# Patient Record
Sex: Male | Born: 1975 | Race: White | Hispanic: No | Marital: Married | Smoking: Current every day smoker
Health system: Southern US, Community
[De-identification: ages and names within clinical notes are randomized; demographics above are authoritative.]

## PROBLEM LIST (undated history)

## (undated) ENCOUNTER — Ambulatory Visit (HOSPITAL_COMMUNITY): Payer: 59

## (undated) VITALS — BP 107/78 | HR 92 | Temp 98.6°F

## (undated) DIAGNOSIS — M5136 Other intervertebral disc degeneration, lumbar region: Secondary | ICD-10-CM

## (undated) DIAGNOSIS — M51369 Other intervertebral disc degeneration, lumbar region without mention of lumbar back pain or lower extremity pain: Secondary | ICD-10-CM

## (undated) DIAGNOSIS — M542 Cervicalgia: Secondary | ICD-10-CM

## (undated) DIAGNOSIS — K5792 Diverticulitis of intestine, part unspecified, without perforation or abscess without bleeding: Secondary | ICD-10-CM

## (undated) DIAGNOSIS — G8929 Other chronic pain: Secondary | ICD-10-CM

## (undated) DIAGNOSIS — F329 Major depressive disorder, single episode, unspecified: Secondary | ICD-10-CM

## (undated) DIAGNOSIS — M1990 Unspecified osteoarthritis, unspecified site: Secondary | ICD-10-CM

## (undated) DIAGNOSIS — M545 Low back pain, unspecified: Secondary | ICD-10-CM

## (undated) DIAGNOSIS — R4589 Other symptoms and signs involving emotional state: Secondary | ICD-10-CM

## (undated) DIAGNOSIS — R569 Unspecified convulsions: Secondary | ICD-10-CM

## (undated) HISTORY — PX: TONSILLECTOMY: SUR1361

## (undated) HISTORY — DX: Unspecified convulsions: R56.9

## (undated) HISTORY — DX: Diverticulitis of intestine, part unspecified, without perforation or abscess without bleeding: K57.92

## (undated) HISTORY — DX: Cervicalgia: M54.2

---

## 1997-12-18 ENCOUNTER — Emergency Department (HOSPITAL_COMMUNITY): Admission: EM | Admit: 1997-12-18 | Discharge: 1997-12-18 | Payer: Self-pay | Admitting: Internal Medicine

## 1998-02-17 ENCOUNTER — Emergency Department (HOSPITAL_COMMUNITY): Admission: EM | Admit: 1998-02-17 | Discharge: 1998-02-17 | Payer: Self-pay | Admitting: Emergency Medicine

## 1999-09-05 ENCOUNTER — Encounter: Payer: Self-pay | Admitting: Emergency Medicine

## 1999-09-05 ENCOUNTER — Emergency Department (HOSPITAL_COMMUNITY): Admission: EM | Admit: 1999-09-05 | Discharge: 1999-09-05 | Payer: Self-pay | Admitting: Emergency Medicine

## 2005-12-28 ENCOUNTER — Emergency Department (HOSPITAL_COMMUNITY): Admission: EM | Admit: 2005-12-28 | Discharge: 2005-12-28 | Payer: Self-pay | Admitting: Emergency Medicine

## 2008-06-13 ENCOUNTER — Emergency Department (HOSPITAL_COMMUNITY): Admission: EM | Admit: 2008-06-13 | Discharge: 2008-06-14 | Payer: Self-pay | Admitting: Emergency Medicine

## 2010-09-22 ENCOUNTER — Emergency Department (HOSPITAL_COMMUNITY): Payer: Self-pay

## 2010-09-22 ENCOUNTER — Emergency Department (HOSPITAL_COMMUNITY)
Admission: EM | Admit: 2010-09-22 | Discharge: 2010-09-22 | Disposition: A | Discharge status: Home / Self Care | Payer: Self-pay | Attending: Emergency Medicine | Admitting: Emergency Medicine

## 2010-09-22 DIAGNOSIS — R1011 Right upper quadrant pain: Secondary | ICD-10-CM | POA: Insufficient documentation

## 2010-09-22 DIAGNOSIS — R110 Nausea: Secondary | ICD-10-CM | POA: Insufficient documentation

## 2010-09-22 DIAGNOSIS — R630 Anorexia: Secondary | ICD-10-CM | POA: Insufficient documentation

## 2010-09-22 LAB — DIFFERENTIAL
Basophils Relative: 0 % (ref 0–1)
Eosinophils Absolute: 0.2 10*3/uL (ref 0.0–0.7)
Eosinophils Relative: 1 % (ref 0–5)
Lymphs Abs: 2.8 10*3/uL (ref 0.7–4.0)
Monocytes Absolute: 1.3 10*3/uL — ABNORMAL HIGH (ref 0.1–1.0)
Monocytes Relative: 10 % (ref 3–12)
Neutrophils Relative %: 67 % (ref 43–77)

## 2010-09-22 LAB — HEPATIC FUNCTION PANEL
ALT: 31 U/L (ref 0–53)
AST: 22 U/L (ref 0–37)
Albumin: 3.6 g/dL (ref 3.5–5.2)
Alkaline Phosphatase: 95 U/L (ref 39–117)
Bilirubin, Direct: 0.1 mg/dL (ref 0.0–0.3)
Indirect Bilirubin: 0.3 mg/dL (ref 0.3–0.9)
Total Bilirubin: 0.4 mg/dL (ref 0.3–1.2)
Total Protein: 7.4 g/dL (ref 6.0–8.3)

## 2010-09-22 LAB — URINALYSIS, ROUTINE W REFLEX MICROSCOPIC
Ketones, ur: NEGATIVE mg/dL
Leukocytes, UA: NEGATIVE
Nitrite: NEGATIVE
Urobilinogen, UA: 0.2 mg/dL (ref 0.0–1.0)
pH: 6.5 (ref 5.0–8.0)

## 2010-09-22 LAB — CBC
MCH: 32.4 pg (ref 26.0–34.0)
MCHC: 35.2 g/dL (ref 30.0–36.0)
MCV: 92 fL (ref 78.0–100.0)
Platelets: 350 10*3/uL (ref 150–400)
RBC: 5.28 MIL/uL (ref 4.22–5.81)
RDW: 13.4 % (ref 11.5–15.5)

## 2010-09-22 LAB — BASIC METABOLIC PANEL
BUN: 9 mg/dL (ref 6–23)
CO2: 26 mEq/L (ref 19–32)
Calcium: 9.9 mg/dL (ref 8.4–10.5)
Chloride: 98 mEq/L (ref 96–112)
Creatinine, Ser: 0.94 mg/dL (ref 0.4–1.5)
GFR calc Af Amer: >60 mL/min (ref >60)
GFR calc non Af Amer: >60 mL/min (ref >60)
Glucose, Bld: 123 mg/dL — ABNORMAL HIGH (ref 70–99)
Potassium: 3.2 mEq/L — ABNORMAL LOW (ref 3.5–5.1)
Sodium: 133 mEq/L — ABNORMAL LOW (ref 135–145)

## 2010-09-22 MED ORDER — IOHEXOL 300 MG/ML  SOLN
100.0000 mL | Freq: Once | INTRAMUSCULAR | Status: AC | PRN
  Administered 2010-09-22: 100 mL via INTRAVENOUS

## 2014-07-04 ENCOUNTER — Encounter (HOSPITAL_COMMUNITY): Payer: Self-pay | Admitting: Emergency Medicine

## 2014-07-04 ENCOUNTER — Emergency Department (HOSPITAL_COMMUNITY)
Admission: EM | Admit: 2014-07-04 | Discharge: 2014-07-04 | Disposition: A | Discharge status: Home / Self Care | Payer: BLUE CROSS/BLUE SHIELD | Attending: Emergency Medicine | Admitting: Emergency Medicine

## 2014-07-04 DIAGNOSIS — Y939 Activity, unspecified: Secondary | ICD-10-CM | POA: Diagnosis not present

## 2014-07-04 DIAGNOSIS — Y999 Unspecified external cause status: Secondary | ICD-10-CM | POA: Diagnosis not present

## 2014-07-04 DIAGNOSIS — X58XXXA Exposure to other specified factors, initial encounter: Secondary | ICD-10-CM | POA: Diagnosis not present

## 2014-07-04 DIAGNOSIS — S39011A Strain of muscle, fascia and tendon of abdomen, initial encounter: Secondary | ICD-10-CM | POA: Diagnosis not present

## 2014-07-04 DIAGNOSIS — Z79899 Other long term (current) drug therapy: Secondary | ICD-10-CM | POA: Insufficient documentation

## 2014-07-04 DIAGNOSIS — S39012A Strain of muscle, fascia and tendon of lower back, initial encounter: Secondary | ICD-10-CM | POA: Diagnosis not present

## 2014-07-04 DIAGNOSIS — Z720 Tobacco use: Secondary | ICD-10-CM | POA: Insufficient documentation

## 2014-07-04 DIAGNOSIS — G8929 Other chronic pain: Secondary | ICD-10-CM | POA: Diagnosis not present

## 2014-07-04 DIAGNOSIS — Y929 Unspecified place or not applicable: Secondary | ICD-10-CM | POA: Insufficient documentation

## 2014-07-04 DIAGNOSIS — S3992XA Unspecified injury of lower back, initial encounter: Secondary | ICD-10-CM | POA: Diagnosis present

## 2014-07-04 MED ORDER — METHOCARBAMOL 500 MG PO TABS: 500.0000 mg | ORAL_TABLET | Freq: Three times a day (TID) | ORAL | Status: DC | PRN

## 2014-07-04 MED ORDER — CYCLOBENZAPRINE HCL 10 MG PO TABS
10.0000 mg | ORAL_TABLET | Freq: Once | ORAL | Status: AC
  Administered 2014-07-04: 10 mg via ORAL
  Filled 2014-07-04: qty 1

## 2014-07-04 MED ORDER — OXYCODONE-ACETAMINOPHEN 5-325 MG PO TABS: 2.0000 | ORAL_TABLET | ORAL | Status: DC | PRN

## 2014-07-04 MED ORDER — OXYCODONE-ACETAMINOPHEN 5-325 MG PO TABS
2.0000 | ORAL_TABLET | Freq: Once | ORAL | Status: AC
  Administered 2014-07-04: 2 via ORAL
  Filled 2014-07-04: qty 2

## 2014-07-04 MED ORDER — NAPROXEN 500 MG PO TABS: 500.0000 mg | ORAL_TABLET | Freq: Two times a day (BID) | ORAL | Status: DC

## 2014-07-04 MED ORDER — IBUPROFEN 800 MG PO TABS
800.0000 mg | ORAL_TABLET | Freq: Once | ORAL | Status: AC
  Administered 2014-07-04: 800 mg via ORAL
  Filled 2014-07-04: qty 1

## 2014-07-04 NOTE — ED Notes (Signed)
Pt was moving a stove, felt a pop in left side of abdomen and lower back pain radiating down left left leg

## 2014-07-04 NOTE — ED Notes (Signed)
Pt alert & oriented x4, stable gait. Patient given discharge instructions, paperwork & prescription(s). Patient  instructed to stop at the registration desk to finish any additional paperwork. Patient verbalized understanding. Pt left department w/ no further questions. 

## 2014-07-04 NOTE — Discharge Instructions (Signed)
Back Pain, Adult °Back pain is very common. The pain often gets better over time. The cause of back pain is usually not dangerous. Most people can learn to manage their back pain on their own.  °HOME CARE  °· Stay active. Start with short walks on flat ground if you can. Try to walk farther each day. °· Do not sit, drive, or stand in one place for more than 30 minutes. Do not stay in bed. °· Do not avoid exercise or work. Activity can help your back heal faster. °· Be careful when you bend or lift an object. Bend at your knees, keep the object close to you, and do not twist. °· Sleep on a firm mattress. Lie on your side, and bend your knees. If you lie on your back, put a pillow under your knees. °· Only take medicines as told by your doctor. °· Put ice on the injured area. °¨ Put ice in a plastic bag. °¨ Place a towel between your skin and the bag. °¨ Leave the ice on for 15-20 minutes, 03-04 times a day for the first 2 to 3 days. After that, you can switch between ice and heat packs. °· Ask your doctor about back exercises or massage. °· Avoid feeling anxious or stressed. Find good ways to deal with stress, such as exercise. °GET HELP RIGHT AWAY IF:  °· Your pain does not go away with rest or medicine. °· Your pain does not go away in 1 week. °· You have new problems. °· You do not feel well. °· The pain spreads into your legs. °· You cannot control when you poop (bowel movement) or pee (urinate). °· Your arms or legs feel weak or lose feeling (numbness). °· You feel sick to your stomach (nauseous) or throw up (vomit). °· You have belly (abdominal) pain. °· You feel like you may pass out (faint). °MAKE SURE YOU:  °· Understand these instructions. °· Will watch your condition. °· Will get help right away if you are not doing well or get worse. °Document Released: 10/07/2007 Document Revised: 07/13/2011 Document Reviewed: 08/22/2013 °[employer]® Patient Information ©2015 [employer]. This information is not intended  to replace advice given to you by your health care provider. Make sure you discuss any questions you have with your health care provider. °Muscle Strain °A muscle strain is an injury that occurs when a muscle is stretched beyond its normal length. Usually a small number of muscle fibers are torn when this happens. Muscle strain is rated in degrees. First-degree strains have the least amount of muscle fiber tearing and pain. Second-degree and third-degree strains have increasingly more tearing and pain.  °Usually, recovery from muscle strain takes 1-2 weeks. Complete healing takes 5-6 weeks.  °CAUSES  °Muscle strain happens when a sudden, violent force placed on a muscle stretches it too far. This may occur with lifting, sports, or a fall.  °RISK FACTORS °Muscle strain is especially common in athletes.  °SIGNS AND SYMPTOMS °At the site of the muscle strain, there may be: °· Pain. °· Bruising. °· Swelling. °· Difficulty using the muscle due to pain or lack of normal function. °DIAGNOSIS  °Your health care provider will perform a physical exam and ask about your medical history. °TREATMENT  °Often, the best treatment for a muscle strain is resting, icing, and applying cold compresses to the injured area.   °HOME CARE INSTRUCTIONS  °· Use the PRICE method of treatment to promote muscle healing during the first 2-3   days after your injury. The PRICE method involves: °¨ Protecting the muscle from being injured again. °¨ Restricting your activity and resting the injured body part. °¨ Icing your injury. To do this, put ice in a plastic bag. Place a towel between your skin and the bag. Then, apply the ice and leave it on from 15-20 minutes each hour. After the third day, switch to moist heat packs. °¨ Apply compression to the injured area with a splint or elastic bandage. Be careful not to wrap it too tightly. This may interfere with blood circulation or increase swelling. °¨ Elevate the injured body part above the level of  your heart as often as you can. °· Only take over-the-counter or prescription medicines for pain, discomfort, or fever as directed by your health care provider. °· Warming up prior to exercise helps to prevent future muscle strains. °SEEK MEDICAL CARE IF:  °· You have increasing pain or swelling in the injured area. °· You have numbness, tingling, or a significant loss of strength in the injured area. °MAKE SURE YOU:  °· Understand these instructions. °· Will watch your condition. °· Will get help right away if you are not doing well or get worse. °Document Released: 04/20/2005 Document Revised: 02/08/2013 Document Reviewed: 11/17/2012 °Exit[employer]® Patient Information ©2015 [employer]. This information is not intended to replace advice given to you by your health care provider. Make sure you discuss any questions you have with your health care provider. ° °

## 2014-07-04 NOTE — ED Provider Notes (Signed)
CSN: [id number]     Arrival date & time 07/04/14  2141 History   First MD Initiated Contact with Patient 07/04/14 2153     Chief Complaint  Patient presents with  . Back Pain      HPI  She presents for evaluation of pain in his anterior abdomen, and low back. Posterior limits 20 to move a washer tonight. States that he tried to [employer] it hard" to turn it. When he did it fell and had to suddenly catch it. He felt sudden pain in his left anterior abdomen, and left lower back. Has a history of mild chronic back pain. Has never been evaluated by physician or had imaging in the past. Tonight he denies pain down the leg. No bowel or bladder changes.  History reviewed. No pertinent past medical history. Past Surgical History  Procedure Laterality Date  . Tonsillectomy     History reviewed. No pertinent family history. History  Substance Use Topics  . Smoking status: Current Every Day Smoker -- 1.00 packs/day    Types: Cigarettes  . Smokeless tobacco: Not on file  . Alcohol Use: No    Review of Systems  Constitutional: Negative for fever, chills, diaphoresis, appetite change and fatigue.  HENT: Negative for mouth sores, sore throat and trouble swallowing.   Eyes: Negative for visual disturbance.  Respiratory: Negative for cough, chest tightness, shortness of breath and wheezing.   Cardiovascular: Negative for chest pain.  Gastrointestinal: Positive for abdominal pain. Negative for nausea, vomiting, diarrhea and abdominal distention.  Endocrine: Negative for polydipsia, polyphagia and polyuria.  Genitourinary: Negative for dysuria, frequency and hematuria.  Musculoskeletal: Positive for back pain. Negative for gait problem.  Skin: Negative for color change, pallor and rash.  Neurological: Negative for dizziness, syncope, light-headedness and headaches.  Hematological: Does not bruise/bleed easily.  Psychiatric/Behavioral: Negative for behavioral problems and confusion.       Allergies  Review of patient's allergies indicates no known allergies.  Home Medications   Prior to Admission medications   Medication Sig Start Date End Date Taking? Authorizing Provider  Glucosamine-Chondroitin-MSM (TRIPLE FLEX PO) Take 1 packet by mouth daily.   Yes Historical Provider, MD   BP 128/69 mmHg  Pulse 88  Temp(Src) 98 F (36.7 C) (Oral)  Resp 20  Ht  (1.778 m)  Wt 230 lb (104.327 kg)  BMI 33.00 kg/m2  SpO2 99% Physical Exam  Constitutional: He is oriented to person, place, and time. He appears well-developed and well-nourished. No distress.  HENT:  Head: Normocephalic.  Eyes: Conjunctivae are normal. Pupils are equal, round, and reactive to light. No scleral icterus.  Neck: Normal range of motion. Neck supple. No thyromegaly present.  Cardiovascular: Normal rate and regular rhythm.  Exam reveals no gallop and no friction rub.   No murmur heard. Pulmonary/Chest: Effort normal and breath sounds normal. No respiratory distress. He has no wheezes. He has no rales.  Abdominal: Soft. Bowel sounds are normal. He exhibits no distension. There is no tenderness. There is no rebound.    Musculoskeletal: Normal range of motion.       Back:  Neurological: He is alert and oriented to person, place, and time.   Normal symmetric strength to flex/.extend hip and knees, dorsi/plantar flex ankles. Normal symmetric sensation to all distributions to LEs Patellar and achilles reflexes 1-2+. Downgoing Babinski   Skin: Skin is warm and dry. No rash noted.  Psychiatric: He has a normal mood and affect. His behavior is normal.  ED Course  Procedures (including critical care time) Labs Review Labs Reviewed - No data to display  Imaging Review No results found.   EKG Interpretation None      MDM   Final diagnoses:  None    Tenderness along the abdominal wall in an oblique fashion. No palpable defect or hernia. No inguinal or scrotal pain or hernia  palpable.  Tinnitus and low lumbar spine. He reports that his medial thighs frequently numb. However he has no radicular pain. He has no weakness or areflexia on exam. No bowel or bladder symptoms. I think is appropriate for outpatient treatment. Conservative treatment with decreased activity, avoiding heavy lifting. Supine for the next 48-72 hours. Anti-inflammatories, pain medicines, muscle relaxants. Primary care follow-up.    Rolland PorterMark Haylei Cobin, MD 07/04/14 2218

## 2014-07-08 ENCOUNTER — Emergency Department (HOSPITAL_COMMUNITY)
Admission: EM | Admit: 2014-07-08 | Discharge: 2014-07-08 | Disposition: A | Discharge status: Other Acute Inpatient Hospital | Payer: BLUE CROSS/BLUE SHIELD | Attending: Emergency Medicine | Admitting: Emergency Medicine

## 2014-07-08 ENCOUNTER — Emergency Department (HOSPITAL_COMMUNITY): Payer: BLUE CROSS/BLUE SHIELD

## 2014-07-08 ENCOUNTER — Encounter (HOSPITAL_COMMUNITY): Payer: Self-pay | Admitting: Emergency Medicine

## 2014-07-08 DIAGNOSIS — M5442 Lumbago with sciatica, left side: Secondary | ICD-10-CM

## 2014-07-08 DIAGNOSIS — R200 Anesthesia of skin: Secondary | ICD-10-CM | POA: Insufficient documentation

## 2014-07-08 DIAGNOSIS — M5441 Lumbago with sciatica, right side: Secondary | ICD-10-CM

## 2014-07-08 DIAGNOSIS — M549 Dorsalgia, unspecified: Secondary | ICD-10-CM

## 2014-07-08 DIAGNOSIS — M4316 Spondylolisthesis, lumbar region: Secondary | ICD-10-CM | POA: Diagnosis not present

## 2014-07-08 DIAGNOSIS — M545 Low back pain: Secondary | ICD-10-CM | POA: Diagnosis present

## 2014-07-08 DIAGNOSIS — M5416 Radiculopathy, lumbar region: Secondary | ICD-10-CM

## 2014-07-08 DIAGNOSIS — M5417 Radiculopathy, lumbosacral region: Secondary | ICD-10-CM

## 2014-07-08 HISTORY — DX: Other intervertebral disc degeneration, lumbar region without mention of lumbar back pain or lower extremity pain: M51.369

## 2014-07-08 HISTORY — DX: Other intervertebral disc degeneration, lumbar region: M51.36

## 2014-07-08 MED ORDER — METHYLPREDNISOLONE SODIUM SUCC 125 MG IJ SOLR
125.0000 mg | Freq: Once | INTRAMUSCULAR | Status: AC
Start: 2014-07-08 — End: 2014-07-08
  Administered 2014-07-08: 125 mg via INTRAVENOUS
  Filled 2014-07-08: qty 2

## 2014-07-08 MED ORDER — DIAZEPAM 5 MG PO TABS: 5.0000 mg | ORAL_TABLET | Freq: Three times a day (TID) | ORAL | Status: DC | PRN

## 2014-07-08 MED ORDER — HYDROMORPHONE HCL 1 MG/ML IJ SOLN
1.0000 mg | Freq: Once | INTRAMUSCULAR | Status: AC
Start: 2014-07-08 — End: 2014-07-08
  Administered 2014-07-08: 1 mg via INTRAMUSCULAR
  Filled 2014-07-08: qty 1

## 2014-07-08 MED ORDER — HYDROMORPHONE HCL 1 MG/ML IJ SOLN
1.0000 mg | Freq: Once | INTRAMUSCULAR | Status: AC
  Administered 2014-07-08: 1 mg via INTRAVENOUS
  Filled 2014-07-08: qty 1

## 2014-07-08 MED ORDER — OXYCODONE-ACETAMINOPHEN 5-325 MG PO TABS: 2.0000 | ORAL_TABLET | ORAL | Status: DC | PRN

## 2014-07-08 MED ORDER — ONDANSETRON HCL 4 MG/2ML IJ SOLN
4.0000 mg | Freq: Once | INTRAMUSCULAR | Status: DC
  Filled 2014-07-08: qty 2

## 2014-07-08 MED ORDER — DIAZEPAM 5 MG/ML IJ SOLN
5.0000 mg | Freq: Once | INTRAMUSCULAR | Status: AC
  Administered 2014-07-08: 5 mg via INTRAVENOUS
  Filled 2014-07-08: qty 2

## 2014-07-08 MED ORDER — PREDNISONE 20 MG PO TABS
ORAL_TABLET | ORAL | Status: DC
Start: 2014-07-08 — End: 2014-08-30

## 2014-07-08 NOTE — ED Provider Notes (Addendum)
Medical screening exam:  Patient referred to the ER from [facility] emergency department where he was evaluated for low back pain. Patient had acute onset of lower back pain while moving appliance. Since then he has had progressive worsening of the pain. Initially had some numbness in his right thigh, now both of his legs are numb and pain is radiating down both legs. He feels like his legs are weak.  Face to face Exam: HEENT - PERRLA Lungs - CTAB Heart - RRR, no M/R/G Abd - S/NT/ND Neuro - alert, oriented x3, bilateral patella reflexes are normal; sensation is normal bilaterally; patient has 4 out of 5 strength bilaterally in lower extremities, subjectively feels weak  Results for orders placed or performed during the hospital encounter of 09/22/10  Hepatic function panel  Result Value Ref Range   Total Protein 7.4 6.0 - 8.3 g/dL   Albumin 3.6 3.5 - 5.2 g/dL   AST 22 0 - 37 U/L   ALT 31 0 - 53 U/L   Alkaline Phosphatase 95 39 - 117 U/L   Total Bilirubin 0.4 0.3 - 1.2 mg/dL   Bilirubin, Direct 0.1 0.0 - 0.3 mg/dL   Indirect Bilirubin 0.3 0.3 - 0.9 mg/dL  Basic metabolic panel  Result Value Ref Range   Sodium 133 (L) 135 - 145 mEq/L   Potassium 3.2 (L) 3.5 - 5.1 mEq/L   Chloride 98 96 - 112 mEq/L   CO2 26 19 - 32 mEq/L   Glucose, Bld 123 (H) 70 - 99 mg/dL   BUN 9 6 - 23 mg/dL   Creatinine, Ser 0.94 0.4 - 1.5 mg/dL   Calcium 9.9 8.4 - 10.5 mg/dL   GFR calc non Af Amer >60 >60 mL/min   GFR calc Af Amer  >60 mL/min    >60        The eGFR has been calculated using the MDRD equation. This calculation has not been validated in all clinical situations. eGFR's persistently <60 mL/min signify possible Chronic Kidney Disease.  Lipase, blood  Result Value Ref Range   Lipase 20 11 - 59 U/L  Differential  Result Value Ref Range   Neutrophils Relative % 67 43 - 77 %   Neutro Abs 8.8 (H) 1.7 - 7.7 K/uL   Lymphocytes Relative 22 12 - 46 %   Lymphs Abs 2.8 0.7 - 4.0 K/uL   Monocytes  Relative 10 3 - 12 %   Monocytes Absolute 1.3 (H) 0.1 - 1.0 K/uL   Eosinophils Relative 1 0 - 5 %   Eosinophils Absolute 0.2 0.0 - 0.7 K/uL   Basophils Relative 0 0 - 1 %   Basophils Absolute 0.0 0.0 - 0.1 K/uL  CBC  Result Value Ref Range   WBC 13.1 (H) 4.0 - 10.5 K/uL   RBC 5.28 4.22 - 5.81 MIL/uL   Hemoglobin 17.1 (H) 13.0 - 17.0 g/dL   HCT 48.6 39.0 - 52.0 %   MCV 92.0 78.0 - 100.0 fL   MCH 32.4 26.0 - 34.0 pg   MCHC 35.2 30.0 - 36.0 g/dL   RDW 13.4 11.5 - 15.5 %   Platelets 350 150 - 400 K/uL  Urinalysis, Routine w reflex microscopic  Result Value Ref Range   Color, Urine YELLOW YELLOW   APPearance CLEAR CLEAR   Specific Gravity, Urine 1.030 1.005 - 1.030   pH 6.5 5.0 - 8.0   Glucose, UA NEGATIVE NEGATIVE mg/dL   Hgb urine dipstick NEGATIVE NEGATIVE   Bilirubin Urine  SMALL (A) NEGATIVE   Ketones, ur NEGATIVE NEGATIVE mg/dL   Protein, ur TRACE (A) NEGATIVE mg/dL   Urobilinogen, UA 0.2 0.0 - 1.0 mg/dL   Nitrite NEGATIVE NEGATIVE   Leukocytes, UA NEGATIVE NEGATIVE  Urine microscopic-add on  Result Value Ref Range   WBC, UA 0-2 0-2 <3 WBC/hpf   Mr Lumbar Spine Wo Contrast  07/08/2014   CLINICAL DATA:  Acute onset lower back pain while moving appliances on July 04, 2014. Progressive worsening pain, RIGHT greater than LEFT leg numbness and pain, leg weakness. History of chronic back pain and lumbar degenerative disc disease.  EXAM: MRI LUMBAR SPINE WITHOUT CONTRAST  TECHNIQUE: Multiplanar, multisequence MR imaging of the lumbar spine was performed. No intravenous contrast was administered.  COMPARISON:  CT of the abdomen and pelvis July 08, 2014 at 1853 hours  FINDINGS: Using the reference level of the last well-formed intervertebral disc as L5-S1, grade 2 (8 mm) L5-S1 anterolisthesis with chronic bilateral L5 pars interarticularis defects. Moderate to severe L5-S1 disc height loss, decreased T2 signal within the disc consistent with desiccation, with linear low signal consistent  with vacuum phenomena. L4-5 disc desiccation with maintained morphology. Mild acute discogenic endplate changes at D7-O2 without STIR signal abnormality to suggest fracture. Scattered chronic Schmorl's nodes.  Conus medullaris terminates at T12-L1 and appears normal in morphology and signal characteristics. Cauda equina is unremarkable. Included prevertebral and paraspinal soft tissues are nonsuspicious.  Level by level evaluation:  T12-L1, L1-2, L2-3, L3-4: No disc bulge, canal stenosis nor neural foraminal narrowing.  L4-5: 2 mm broad-based disc bulge. Mild facet arthropathy with trace facet effusions which are likely reactive. No canal stenosis. No significant neural foraminal narrowing.  L5-S1: Anterolisthesis. 3 mm broad-based disc bulge, mild facet arthropathy without canal stenosis. Severe bilateral neural foraminal narrowing.  IMPRESSION: No acute fracture. Grade 2 L5-S1 anterolisthesis, chronic bilateral L5 pars interarticularis defects.  No canal stenosis. Severe bilateral L5-S1 neural foraminal narrowing.   Electronically Signed   By: Elon Alas   On: 07/08/2014 23:01   Ct Renal Stone Study  07/08/2014   CLINICAL DATA:  Back pain radiating into both legs with frequent urination for 6 days. History of chronic back pain. Evaluate for ureteral calculus. Initial encounter.  EXAM: CT ABDOMEN AND PELVIS WITHOUT CONTRAST  TECHNIQUE: Multidetector CT imaging of the abdomen and pelvis was performed following the standard protocol without IV contrast.  COMPARISON:  09/22/2010 CT.  FINDINGS: Lower chest: Clear lung bases. No significant pleural or pericardial effusion.  Hepatobiliary: As evaluated in the noncontrast state, the liver appears unremarkable without focal abnormality. No evidence of gallstones, gallbladder wall thickening or biliary dilatation.  Pancreas: Unremarkable. No pancreatic ductal dilatation or surrounding inflammatory changes.  Spleen: Normal in size without focal abnormality.   Adrenals/Urinary Tract: Both adrenal glands appear normal.There is no evidence of renal or ureteral calculus. There is no hydronephrosis or perinephric soft tissue stranding. Low-density renal lesions are present bilaterally, including 1 in the upper interpolar region of the left kidney which has enlarged, measuring 2.8 cm on image 31. This corresponds with a cyst on prior contrast-enhanced study. No bladder abnormalities identified.  Stomach/Bowel: No evidence of bowel wall thickening, distention or surrounding inflammatory change.The appendix appears normal.  Vascular/Lymphatic: There are no enlarged abdominal or pelvic lymph nodes. No significant vascular findings on non contrast imaging.  Reproductive: Unremarkable.  Other: No evidence of abdominal wall mass or hernia.  Musculoskeletal: Again demonstrated are bilateral L5 pars defects with a grade 1  anterolisthesis and significant biforaminal stenosis at L5-S1. There is disc degeneration with loss of disc height and annular disc bulging. No acute osseous findings apparent.  IMPRESSION: 1. No acute abdominal pelvic findings. No evidence of urinary tract calculus. 2. Bilateral renal cysts with interval enlargement on the left. 3. 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   Electronically Signed   By: Richardean Sale M.D.   On: 07/08/2014 18:58      Plan: MRI lumbar spine to evaluate for possible surgical problem was performed. Patient does have anterolisthesis at L5-S1, 3 mm broad base disc bulge, chronic bilateral L5 pars interarticularis defects, severe bilateral L5-S1 neural foraminal narrowing. Patient does not have anything that acutely require surgery at this time. He did have some numbness and tingling in the legs, but does not have neurologic deficit. He will be treated with steroids, analgesia, follow up with neurosurgery.   Orpah Greek, MD 07/08/14  2056  Orpah Greek, MD 07/08/14 2329

## 2014-07-08 NOTE — ED Notes (Addendum)
PT states lower back pain that radiates into his legs worsening x1 week. PT states was given Naproxen, Robaxin and finished the pain medication that was prescribed to him on that day. PT states the pain from his back is radiating into his testicles as well and denies any swelling. PT also c/o increased frequency in urination.

## 2014-07-08 NOTE — Discharge Instructions (Signed)

## 2014-07-08 NOTE — ED Provider Notes (Signed)
CSN: [id number]     Arrival date & time 07/08/14  1721 History   First MD Initiated Contact with Patient 07/08/14 1805     Chief Complaint  Patient presents with  . Back Pain      HPI  Pt was seen at 1825.  Per pt, c/o gradual onset and worsening of persistent lower back "pain" for the past 1 week.  Denies any change in his usual chronic pain pattern.  Pain worsens with palpation of the area and body position changes. Pt states the pain began after he helped move a washer. Pt initially states he had pain in his left lower back and abd, was evaluated in the ED, dx muscle strain, and rx naproxen, robaxin, and pain medication. Pt states the pain is now located in his bilateral lower back areas, and radiates into his testicles as well as down both his legs. Pt states his right anterior-lateral thigh "was numb," now both his legs are "weak and numb." Pt also feels he is "urinating more frequently." Pt states the pain in his abd has resolved. Denies new injury. Denies incont/retention of bowel or bladder, no saddle anesthesia, no fevers, no dysuria/hematuria, no testicular pain/swelling.     Past Medical History  Diagnosis Date  . Chronic back pain   . DDD (degenerative disc disease), lumbar    Past Surgical History  Procedure Laterality Date  . Tonsillectomy      History  Substance Use Topics  . Smoking status: Current Every Day Smoker -- 1.00 packs/day    Types: Cigarettes  . Smokeless tobacco: Not on file  . Alcohol Use: No    Review of Systems ROS: Statement: All systems negative except as marked or noted in the HPI; Constitutional: Negative for fever and chills. ; ; Eyes: Negative for eye pain, redness and discharge. ; ; ENMT: Negative for ear pain, hoarseness, nasal congestion, sinus pressure and sore throat. ; ; Cardiovascular: Negative for chest pain, palpitations, diaphoresis, dyspnea and peripheral edema. ; ; Respiratory: Negative for cough, wheezing and stridor. ; ;  Gastrointestinal: Negative for nausea, vomiting, diarrhea, abdominal pain, blood in stool, hematemesis, jaundice and rectal bleeding. . ; ; Genitourinary: +"urinary frequency." Negative for dysuria, flank pain and hematuria. ; ; Musculoskeletal: +LBP. Negative for neck pain. Negative for swelling and trauma.; ; Skin: Negative for pruritus, rash, abrasions, blisters, bruising and skin lesion.; ; Neuro: +paresthesias. Negative for headache, lightheadedness and neck stiffness. Negative for weakness, altered level of consciousness , altered mental status, extremity weakness, involuntary movement, seizure and syncope.      Allergies  Review of patient's allergies indicates no known allergies.  Home Medications   Prior to Admission medications   Medication Sig Start Date End Date Taking? Authorizing Provider  Glucosamine-Chondroitin-MSM (TRIPLE FLEX PO) Take 1 packet by mouth daily.   Yes Historical Provider, MD  methocarbamol (ROBAXIN) 500 MG tablet Take 1 tablet (500 mg total) by mouth 3 (three) times daily between meals as needed. 07/04/14  Yes Rolland PorterMark James, MD  naproxen (NAPROSYN) 500 MG tablet Take 1 tablet (500 mg total) by mouth 2 (two) times daily. 07/04/14  Yes Rolland PorterMark James, MD  oxyCODONE-acetaminophen (PERCOCET/ROXICET) 5-325 MG per tablet Take 2 tablets by mouth every 4 (four) hours as needed. Patient not taking: Reported on 07/08/2014 07/04/14   Rolland PorterMark James, MD   BP 137/80 mmHg  Pulse 81  Temp(Src) 98.1 F (36.7 C) (Oral)  Resp 18  Ht 5\' 10"  (1.778 m)  Wt 230 lb (104.327  kg)  BMI 33.00 kg/m2  SpO2 98% Physical Exam  1830: Physical examination:  Nursing notes reviewed; Vital signs and O2 SAT reviewed;  Constitutional: Well developed, Well nourished, Well hydrated, Uncomfortable appearing.; Head:  Normocephalic, atraumatic; Eyes: EOMI, PERRL, No scleral icterus; ENMT: Mouth and pharynx normal, Mucous membranes moist; Neck: Supple, Full range of motion, No lymphadenopathy; Cardiovascular: Regular  rate and rhythm, No murmur, rub, or gallop; Respiratory: Breath sounds clear & equal bilaterally, No rales, rhonchi, wheezes.  Speaking full sentences with ease, Normal respiratory effort/excursion; Chest: Nontender, Movement normal; Abdomen: Soft, Nontender, Nondistended, Normal bowel sounds; Genitourinary: No CVA tenderness; Spine:  No midline CS, TS, LS tenderness. +TTP bilat lumbar paraspinal muscles. No rash.;; Extremities: Pulses normal, No tenderness, No edema, No calf edema or asymmetry.; Neuro: AA&Ox3, Major CN grossly intact.  Speech clear. DTR 2/2 equal bilat LE's. Strength 5/5 bilat UE's and 4/5 bilat LE's. No gross sensory deficits..; Skin: Color normal, Warm, Dry.   ED Course  Procedures     EKG Interpretation None      MDM  MDM Reviewed: previous chart, nursing note and vitals Interpretation: CT scan and labs   Udip: yellow, clear, pH 6, SG <1.005, uro 0.2, negative for: pro, glu, ket, bil, bl, leu, nit.   Ct Renal Stone Study 07/08/2014   CLINICAL DATA:  Back pain radiating into both legs with frequent urination for 6 days. History of chronic back pain. Evaluate for ureteral calculus. Initial encounter.  EXAM: CT ABDOMEN AND PELVIS WITHOUT CONTRAST  TECHNIQUE: Multidetector CT imaging of the abdomen and pelvis was performed following the standard protocol without IV contrast.  COMPARISON:  09/22/2010 CT.  FINDINGS: Lower chest: Clear lung bases. No significant pleural or pericardial effusion.  Hepatobiliary: As evaluated in the noncontrast state, the liver appears unremarkable without focal abnormality. No evidence of gallstones, gallbladder wall thickening or biliary dilatation.  Pancreas: Unremarkable. No pancreatic ductal dilatation or surrounding inflammatory changes.  Spleen: Normal in size without focal abnormality.  Adrenals/Urinary Tract: Both adrenal glands appear normal.There is no evidence of renal or ureteral calculus. There is no hydronephrosis or perinephric soft  tissue stranding. Low-density renal lesions are present bilaterally, including 1 in the upper interpolar region of the left kidney which has enlarged, measuring 2.8 cm on image 31. This corresponds with a cyst on prior contrast-enhanced study. No bladder abnormalities identified.  Stomach/Bowel: No evidence of bowel wall thickening, distention or surrounding inflammatory change.The appendix appears normal.  Vascular/Lymphatic: There are no enlarged abdominal or pelvic lymph nodes. No significant vascular findings on non contrast imaging.  Reproductive: Unremarkable.  Other: No evidence of abdominal wall mass or hernia.  Musculoskeletal: Again demonstrated are bilateral L5 pars defects with a grade 1 anterolisthesis and significant biforaminal stenosis at L5-S1. There is disc degeneration with loss of disc height and annular disc bulging. No acute osseous findings apparent.  IMPRESSION: 1. No acute abdominal pelvic findings. No evidence of urinary tract calculus. 2. Bilateral renal cysts with interval enlargement on the left. 3. 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   Electronically Signed   By: Carey Bullocks M.D.   On: 07/08/2014 18:58     1915:  Concerned regarding pt's progressive symptoms, HPI, and CT findings above; will need MRI.  Neuro exam remains unchanged while in the ED.  Dx and testing d/w pt and family.  Questions answered.  Verb understanding, agreeable to transfer to [facility] ED to obtain MRI  LS.   T/C to [facility] ED Dr. Blinda Leatherwood, case discussed, including:  HPI, pertinent PM/SHx, VS/PE, dx testing, ED course and treatment:  Will accept transfer to [facility] ED to obtain MRI LS. [facility] Charge RN aware.     Samuel Jester, DO 07/10/14 [phone]

## 2014-07-09 LAB — URINALYSIS, ROUTINE W REFLEX MICROSCOPIC
BILIRUBIN URINE: NEGATIVE
Glucose, UA: NEGATIVE mg/dL
HGB URINE DIPSTICK: NEGATIVE
KETONES UR: NEGATIVE mg/dL
Leukocytes, UA: NEGATIVE
NITRITE: NEGATIVE
PH: 6 (ref 5.0–8.0)
Protein, ur: NEGATIVE mg/dL
Urobilinogen, UA: 0.2 mg/dL (ref 0.0–1.0)

## 2014-08-07 ENCOUNTER — Other Ambulatory Visit: Payer: Self-pay | Admitting: Neurological Surgery

## 2014-08-21 ENCOUNTER — Other Ambulatory Visit: Payer: Self-pay

## 2014-08-21 ENCOUNTER — Encounter (HOSPITAL_COMMUNITY): Payer: Self-pay | Admitting: *Deleted

## 2014-08-21 ENCOUNTER — Emergency Department (HOSPITAL_COMMUNITY)
Admission: EM | Admit: 2014-08-21 | Discharge: 2014-08-21 | Discharge status: Against Medical Advice | Payer: BLUE CROSS/BLUE SHIELD | Attending: Emergency Medicine | Admitting: Emergency Medicine

## 2014-08-21 DIAGNOSIS — G8929 Other chronic pain: Secondary | ICD-10-CM | POA: Insufficient documentation

## 2014-08-21 DIAGNOSIS — Z720 Tobacco use: Secondary | ICD-10-CM | POA: Insufficient documentation

## 2014-08-21 DIAGNOSIS — R112 Nausea with vomiting, unspecified: Secondary | ICD-10-CM | POA: Diagnosis not present

## 2014-08-21 DIAGNOSIS — R079 Chest pain, unspecified: Secondary | ICD-10-CM | POA: Insufficient documentation

## 2014-08-21 LAB — BASIC METABOLIC PANEL
Anion gap: 13 (ref 5–15)
BUN: 15 mg/dL (ref 6–23)
CHLORIDE: 103 mmol/L (ref 96–112)
CO2: 22 mmol/L (ref 19–32)
Calcium: 9.9 mg/dL (ref 8.4–10.5)
Creatinine, Ser: 1.05 mg/dL (ref 0.50–1.35)
GFR calc non Af Amer: 88 mL/min — ABNORMAL LOW (ref >90)
Glucose, Bld: 115 mg/dL — ABNORMAL HIGH (ref 70–99)
POTASSIUM: 4.1 mmol/L (ref 3.5–5.1)
SODIUM: 138 mmol/L (ref 135–145)

## 2014-08-21 LAB — CBC
HEMATOCRIT: 46.9 % (ref 39.0–52.0)
HEMOGLOBIN: 16.3 g/dL (ref 13.0–17.0)
MCH: 30.4 pg (ref 26.0–34.0)
MCHC: 34.8 g/dL (ref 30.0–36.0)
MCV: 87.5 fL (ref 78.0–100.0)
Platelets: 341 10*3/uL (ref 150–400)
RBC: 5.36 MIL/uL (ref 4.22–5.81)
RDW: 14.4 % (ref 11.5–15.5)
WBC: 32 10*3/uL — ABNORMAL HIGH (ref 4.0–10.5)

## 2014-08-21 LAB — I-STAT TROPONIN, ED: Troponin i, poc: 0 ng/mL (ref 0.00–0.08)

## 2014-08-21 NOTE — ED Notes (Signed)
Unable to locate when called for room x3 

## 2014-08-21 NOTE — ED Notes (Signed)
Pt sates that he woke this morning with chest pain radiating to left arm, sweating and nausea/vomitting x2 pt states that he had an upper lumbar epidural yesterday. Office requested that pt come in for evaluation.

## 2014-08-22 ENCOUNTER — Inpatient Hospital Stay (HOSPITAL_COMMUNITY)
Admission: RE | Admit: 2014-08-22 | Discharge: 2014-08-22 | Disposition: A | Discharge status: Home / Self Care | Payer: BLUE CROSS/BLUE SHIELD | Source: Ambulatory Visit

## 2014-08-22 NOTE — Pre-Procedure Instructions (Addendum)
Pollyann GlenSteven T Eckles  08/22/2014   Your procedure is scheduled on:  August 30, 2014  Report to [facility] Admitting at 5:30 AM.  Call this number if you have problems the morning of surgery: [phone]   Remember:   Do not eat food or drink liquids after midnight.   Take these medicines the morning of surgery with A SIP OF WATER: oxyCODONE-acetaminophen  (PERCOCET) if needed   STOP NSAIDS (NAPROXEN) 7 DAYS PRIOR TO SURGERY   Do not wear jewelry.  Do not wear lotions, powders, or colognes. You may wear deodorant.  Men may shave face and neck.  Do not bring valuables to the hospital.  [facility] is not responsible      for any belongings or valuables.               Contacts, dentures or bridgework may not be worn into surgery.  Leave suitcase in the car. After surgery it may be brought to your room.  For patients admitted to the hospital, discharge time is determined by your                treatment team.               Patients discharged the day of surgery will not be allowed to drive  home.  Name and phone number of your driver: family/friend  Special Instructions: "PREPARING FOR SURGERY"   Please read over the following fact sheets that you were given: Pain Booklet, Coughing and Deep Breathing, Blood Transfusion Information and Surgical Site Infection Prevention

## 2014-08-25 ENCOUNTER — Encounter (HOSPITAL_COMMUNITY): Payer: Self-pay | Admitting: *Deleted

## 2014-08-25 ENCOUNTER — Emergency Department (HOSPITAL_COMMUNITY): Payer: BLUE CROSS/BLUE SHIELD

## 2014-08-25 ENCOUNTER — Emergency Department (HOSPITAL_COMMUNITY)
Admission: EM | Admit: 2014-08-25 | Discharge: 2014-08-26 | Disposition: A | Discharge status: Home / Self Care | Payer: BLUE CROSS/BLUE SHIELD | Attending: Emergency Medicine | Admitting: Emergency Medicine

## 2014-08-25 ENCOUNTER — Other Ambulatory Visit: Payer: Self-pay

## 2014-08-25 DIAGNOSIS — R079 Chest pain, unspecified: Secondary | ICD-10-CM | POA: Insufficient documentation

## 2014-08-25 DIAGNOSIS — Z720 Tobacco use: Secondary | ICD-10-CM | POA: Diagnosis not present

## 2014-08-25 DIAGNOSIS — M549 Dorsalgia, unspecified: Secondary | ICD-10-CM | POA: Diagnosis not present

## 2014-08-25 DIAGNOSIS — M5412 Radiculopathy, cervical region: Secondary | ICD-10-CM

## 2014-08-25 DIAGNOSIS — M542 Cervicalgia: Secondary | ICD-10-CM | POA: Diagnosis not present

## 2014-08-25 DIAGNOSIS — R6883 Chills (without fever): Secondary | ICD-10-CM

## 2014-08-25 LAB — TROPONIN I: Troponin I: <0.03 ng/mL (ref <0.031)

## 2014-08-25 LAB — URINALYSIS, ROUTINE W REFLEX MICROSCOPIC
Bilirubin Urine: NEGATIVE
GLUCOSE, UA: NEGATIVE mg/dL
Hgb urine dipstick: NEGATIVE
Ketones, ur: NEGATIVE mg/dL
Leukocytes, UA: NEGATIVE
NITRITE: NEGATIVE
PH: 7.5 (ref 5.0–8.0)
Protein, ur: NEGATIVE mg/dL
Specific Gravity, Urine: 1.02 (ref 1.005–1.030)
Urobilinogen, UA: 0.2 mg/dL (ref 0.0–1.0)

## 2014-08-25 LAB — CBC WITH DIFFERENTIAL/PLATELET
Basophils Absolute: 0 10*3/uL (ref 0.0–0.1)
Basophils Relative: 0 % (ref 0–1)
EOS PCT: 0 % (ref 0–5)
Eosinophils Absolute: 0.1 10*3/uL (ref 0.0–0.7)
HCT: 45.6 % (ref 39.0–52.0)
HEMOGLOBIN: 15.7 g/dL (ref 13.0–17.0)
LYMPHS ABS: 2.4 10*3/uL (ref 0.7–4.0)
LYMPHS PCT: 15 % (ref 12–46)
MCH: 30.3 pg (ref 26.0–34.0)
MCHC: 34.4 g/dL (ref 30.0–36.0)
MCV: 87.9 fL (ref 78.0–100.0)
Monocytes Absolute: 0.7 10*3/uL (ref 0.1–1.0)
Monocytes Relative: 5 % (ref 3–12)
NEUTROS PCT: 80 % — AB (ref 43–77)
Neutro Abs: 12.7 10*3/uL — ABNORMAL HIGH (ref 1.7–7.7)
PLATELETS: 315 10*3/uL (ref 150–400)
RBC: 5.19 MIL/uL (ref 4.22–5.81)
RDW: 13.8 % (ref 11.5–15.5)
WBC: 15.9 10*3/uL — ABNORMAL HIGH (ref 4.0–10.5)

## 2014-08-25 LAB — BASIC METABOLIC PANEL
Anion gap: 7 (ref 5–15)
BUN: 13 mg/dL (ref 6–23)
CALCIUM: 8.9 mg/dL (ref 8.4–10.5)
CO2: 26 mmol/L (ref 19–32)
CREATININE: 1.01 mg/dL (ref 0.50–1.35)
Chloride: 105 mmol/L (ref 96–112)
GFR calc Af Amer: >90 mL/min (ref >90)
GLUCOSE: 111 mg/dL — AB (ref 70–99)
Potassium: 3.3 mmol/L — ABNORMAL LOW (ref 3.5–5.1)
Sodium: 138 mmol/L (ref 135–145)

## 2014-08-25 LAB — SEDIMENTATION RATE: SED RATE: 2 mm/h (ref 0–16)

## 2014-08-25 MED ORDER — ACETAMINOPHEN 500 MG PO TABS
1000.0000 mg | ORAL_TABLET | Freq: Once | ORAL | Status: AC
  Administered 2014-08-25: 1000 mg via ORAL
  Filled 2014-08-25: qty 2

## 2014-08-25 MED ORDER — METOCLOPRAMIDE HCL 5 MG/ML IJ SOLN
10.0000 mg | Freq: Once | INTRAMUSCULAR | Status: AC
  Administered 2014-08-25: 10 mg via INTRAVENOUS
  Filled 2014-08-25: qty 2

## 2014-08-25 MED ORDER — ONDANSETRON HCL 4 MG/2ML IJ SOLN
INTRAMUSCULAR | Status: AC
  Filled 2014-08-25: qty 4

## 2014-08-25 MED ORDER — DIAZEPAM 5 MG PO TABS
5.0000 mg | ORAL_TABLET | Freq: Once | ORAL | Status: AC
  Administered 2014-08-26: 5 mg via ORAL
  Filled 2014-08-25: qty 1

## 2014-08-25 MED ORDER — SODIUM CHLORIDE 0.9 % IV SOLN
8.0000 mg | Freq: Once | INTRAVENOUS | Status: AC
  Filled 2014-08-25: qty 4

## 2014-08-25 MED ORDER — PROMETHAZINE HCL 25 MG/ML IJ SOLN
12.5000 mg | Freq: Once | INTRAMUSCULAR | Status: AC
  Administered 2014-08-25: 12.5 mg via INTRAVENOUS
  Filled 2014-08-25: qty 1

## 2014-08-25 MED ORDER — SODIUM CHLORIDE 0.9 % IV SOLN
Freq: Once | INTRAVENOUS | Status: AC
  Administered 2014-08-25: 20:00:00 via INTRAVENOUS

## 2014-08-25 MED ORDER — MORPHINE SULFATE 4 MG/ML IJ SOLN
4.0000 mg | INTRAMUSCULAR | Status: DC | PRN
  Administered 2014-08-25 – 2014-08-26 (×2): 4 mg via INTRAVENOUS
  Filled 2014-08-25 (×2): qty 1

## 2014-08-25 NOTE — ED Notes (Signed)
[facility] here to transport,  

## 2014-08-25 NOTE — ED Notes (Signed)
zofran 8 mg given to [facility] to start while enroute per their request,

## 2014-08-25 NOTE — ED Provider Notes (Signed)
CSN: [id number]     Arrival date & time 08/25/14  1859 History   First MD Initiated Contact with Patient 08/25/14 1913     Chief Complaint  Patient presents with  . Back Pain      HPI  Patient presents with nausea vomiting pain back pain and sweats.  Sig neck and history for an injury in early March. Seen by myself in the emergency room. He was sitting to move a dryer. It fell awkwardly. He tried to grab it. He felt pain in his back and into his left flank and abdomen. No signs of acute herniation. Treated with anti-inflammatories, pain medicines, muscle relaxants. Discharge. Returned several days later with worsening pain. Had MRI performed. [facility]. Had several levels of degenerative disc disease without acute herniation. Saw Dr. Marikay Alaravid Jones in follow-up. On April 18 had a epidural injection. This is upper thoracic at the injection site where he shows me. This is slightly below the midline posterior resting tattoo. He states that the following day, the 19th he had worsening pain however more so in his upper thoracic spine and now is rating to his left arm and down into his thumb, index, and middle finger. He's had persistent nausea and vomiting. Episodes of shakes and chills. On the 19th [facility] had labs obtained. However he states after a 5R weight he was "miserable" so he left. Blood count that they does reveal leukocytosis of 32,000. His continued with symptoms and presents here.    Past Medical History  Diagnosis Date  . Chronic back pain   . DDD (degenerative disc disease), lumbar    Past Surgical History  Procedure Laterality Date  . Tonsillectomy     History reviewed. No pertinent family history. History  Substance Use Topics  . Smoking status: Current Every Day Smoker -- 1.00 packs/day    Types: Cigarettes  . Smokeless tobacco: Not on file  . Alcohol Use: No    Review of Systems  Constitutional: Positive for fever, chills,  diaphoresis and fatigue. Negative for appetite change.  HENT: Negative for mouth sores, sore throat and trouble swallowing.   Eyes: Negative for visual disturbance.  Respiratory: Positive for chest tightness and shortness of breath. Negative for cough and wheezing.   Cardiovascular: Positive for chest pain.  Gastrointestinal: Positive for nausea and vomiting. Negative for abdominal pain, diarrhea and abdominal distention.  Endocrine: Negative for polydipsia, polyphagia and polyuria.  Genitourinary: Negative for dysuria, frequency and hematuria.  Musculoskeletal: Negative for gait problem.  Skin: Negative for color change, pallor and rash.  Neurological: Negative for dizziness, syncope, light-headedness and headaches.       Pain down his left arm and a C6 dermatome.  Hematological: Does not bruise/bleed easily.  Psychiatric/Behavioral: Negative for behavioral problems and confusion.      Allergies  Review of patient's allergies indicates no known allergies.  Home Medications   Prior to Admission medications   Medication Sig Start Date End Date Taking? Authorizing Provider  diphenhydrAMINE (BENADRYL) 25 MG tablet Take 50 mg by mouth every 6 (six) hours as needed for allergies.   Yes Historical Provider, MD  ondansetron (ZOFRAN) 4 MG tablet Take 4 mg by mouth every 8 (eight) hours as needed for nausea or vomiting.   Yes Historical Provider, MD  oxyCODONE-acetaminophen (PERCOCET) 10-325 MG per tablet Take 1 tablet by mouth every 4 (four) hours as needed for pain.   Yes Historical Provider, MD  diazepam (  VALIUM) 5 MG tablet Take 1 tablet (5 mg total) by mouth every 8 (eight) hours as needed for anxiety. Patient not taking: Reported on 08/25/2014 07/08/14   Gilda Crease, MD  methocarbamol (ROBAXIN) 500 MG tablet Take 1 tablet (500 mg total) by mouth 3 (three) times daily between meals as needed. Patient not taking: Reported on 08/20/2014 07/04/14   Rolland Porter, MD  naproxen (NAPROSYN) 500  MG tablet Take 1 tablet (500 mg total) by mouth 2 (two) times daily. Patient not taking: Reported on 08/25/2014 07/04/14   Rolland Porter, MD  oxyCODONE-acetaminophen (PERCOCET) 5-325 MG per tablet Take 2 tablets by mouth every 4 (four) hours as needed. Patient not taking: Reported on 08/25/2014 07/08/14   Gilda Crease, MD  predniSONE (DELTASONE) 20 MG tablet 3 tabs po daily x 3 days, then 2 tabs x 3 days, then 1.5 tabs x 3 days, then 1 tab x 3 days, then 0.5 tabs x 3 days Patient not taking: Reported on 08/20/2014 07/08/14   Gilda Crease, MD   BP 131/74 mmHg  Pulse 78  Temp(Src) 98 F (36.7 C) (Oral)  Resp 18  Ht  (1.753 m)  Wt 226 lb (102.513 kg)  BMI 33.36 kg/m2  SpO2 100% Physical Exam  Constitutional: He is oriented to person, place, and time. He appears well-developed and well-nourished. He appears distressed.  He appears ill. However he is awake and alert conversant.  HENT:  Head: Normocephalic.  No scleral icterus. Conjunctiva not pale. Oropharynx peak and moist.  Eyes: Conjunctivae are normal. Pupils are equal, round, and reactive to light. No scleral icterus.  Neck: Normal range of motion. Neck supple. No thyromegaly present.  Neck supple. No meningismus.  Cardiovascular: Normal rate and regular rhythm.  Exam reveals no gallop and no friction rub.   No murmur heard. No Murmur to auscultation.  Pulmonary/Chest: Effort normal and breath sounds normal. No respiratory distress. He has no wheezes. He has no rales.  Clear bilateral breath sounds.  Abdominal: Soft. Bowel sounds are normal. He exhibits no distension. There is no tenderness. There is no rebound.  Musculoskeletal: Normal range of motion.       Back:  Neurological: He is alert and oriented to person, place, and time.  Patient indicates pain from his posterior left lateral neck down the posterior aspect of his arm to the thumb, index, middle finger. He does not have weakness to have an abduct the fingers  grip strength extension of the wrist or flexion extension at the elbow.  Skin: Skin is warm. No rash noted. He is diaphoretic.  Psychiatric: He has a normal mood and affect. His behavior is normal.    ED Course  Procedures (including critical care time) Labs Review Labs Reviewed  CBC WITH DIFFERENTIAL/PLATELET - Abnormal; Notable for the following:    WBC 15.9 (*)    Neutrophils Relative % 80 (*)    Neutro Abs 12.7 (*)    All other components within normal limits  CULTURE, BLOOD (ROUTINE X 2)  CULTURE, BLOOD (ROUTINE X 2)  URINE CULTURE  URINALYSIS, ROUTINE W REFLEX MICROSCOPIC  BASIC METABOLIC PANEL  SEDIMENTATION RATE  TROPONIN I    Imaging Review Dg Chest Port 1 View  08/25/2014   CLINICAL DATA:  Nausea and LEFT arm pain. Previous epidural injection.  EXAM: PORTABLE CHEST - 1 VIEW  COMPARISON:  None.  FINDINGS: The heart size and mediastinal contours are within normal limits. Both lungs are clear. The visualized skeletal  structures are unremarkable.  IMPRESSION: No active disease.   Electronically Signed   By: Davonna Belling M.D.   On: 08/25/2014 20:42     EKG Interpretation None      MDM   Final diagnoses:  Chest pain  Cervical radicular pain  Chills    Patient concern for epidural abscess secondary to epidural steroid injection. Fever shakes chills.  New cervical/thoracic pain radiating to his left arm and a C6 radicular fashion. Previous injection site approximately T1 or T2. Afebrile but with frequent fever shakes chills. He will need need MRI. Screening labs, blood cultures, sedimentation rate obtained here. I discussed the case with my partner Dr. Rosalia Hammers at [facility]. Also discussed the case with Dr. Lezlie Lye of neurosurgery, on call for Dr. Yetta Barre. Patient will be transferred via ambulance to [facility] for emergent MRI, rule out epidural abscess.    Rolland Porter, MD 08/25/14 2105

## 2014-08-25 NOTE — ED Notes (Addendum)
Pt states he had an upper lumbar epidural on the 18th of April. Ever since he has had the epidural, he has had n/v, sweats, back pain, chills, and chest tightness. Pt went to [facility] on April 19th and left because of the wait time. Wife states pt has lost 20 lbs since last Monday. Pt stats he feels like his body is on fire.

## 2014-08-25 NOTE — ED Notes (Signed)
Dr Fayrene FearingJames notified, additional orders given,

## 2014-08-25 NOTE — ED Notes (Signed)
[facility] [place],

## 2014-08-25 NOTE — ED Provider Notes (Signed)
CSN: [id number]     Arrival date & time 08/25/14  1859 History  This chart was scribed for Tomasita Crumble, MD by Evon Slack, ED Scribe. This patient was seen in room D31C/D31C and the patient's care was started at 11:17 PM.      Chief Complaint  Patient presents with  . Back Pain   Patient is a 39 y.o. male presenting with back pain. The history is provided by the patient. No language interpreter was used.  Back Pain Pain severity:  Moderate Duration:  5 days Timing:  Constant Progression:  Worsening Relieved by:  Nothing Associated symptoms: fever    HPI Comments: Eduardo Barnes is a 39 y.o. male who presents to the Emergency Department complaining of back pain onset 5 days prior. Pt states that he has had worsening back pain since having an spinal injection in his cervical spine. Pt reports having fever, nausea, chills, chest tightness and unexpected weight loss. Pt reports numbness/ tingling in his left arm that's radiating down from the injection site all the way down into his 1st 3 digits in the left hand.   Past Medical History  Diagnosis Date  . Chronic back pain   . DDD (degenerative disc disease), lumbar    Past Surgical History  Procedure Laterality Date  . Tonsillectomy     History reviewed. No pertinent family history. History  Substance Use Topics  . Smoking status: Current Every Day Smoker -- 1.00 packs/day    Types: Cigarettes  . Smokeless tobacco: Not on file  . Alcohol Use: No    Review of Systems  Constitutional: Positive for fever, chills and unexpected weight change.  Respiratory: Positive for chest tightness.   Musculoskeletal: Positive for back pain.  All other systems reviewed and are negative.   Allergies  Review of patient's allergies indicates no known allergies.  Home Medications   Prior to Admission medications   Medication Sig Start Date End Date Taking? Authorizing Provider  diphenhydrAMINE (BENADRYL) 25 MG tablet Take 50 mg by  mouth every 6 (six) hours as needed for allergies.   Yes Historical Provider, MD  ondansetron (ZOFRAN) 4 MG tablet Take 4 mg by mouth every 8 (eight) hours as needed for nausea or vomiting.   Yes Historical Provider, MD  oxyCODONE-acetaminophen (PERCOCET) 10-325 MG per tablet Take 1 tablet by mouth every 4 (four) hours as needed for pain.   Yes Historical Provider, MD  diazepam (VALIUM) 5 MG tablet Take 1 tablet (5 mg total) by mouth every 8 (eight) hours as needed for anxiety. Patient not taking: Reported on 08/25/2014 07/08/14   Gilda Crease, MD  methocarbamol (ROBAXIN) 500 MG tablet Take 1 tablet (500 mg total) by mouth 3 (three) times daily between meals as needed. Patient not taking: Reported on 08/20/2014 07/04/14   Rolland Porter, MD  naproxen (NAPROSYN) 500 MG tablet Take 1 tablet (500 mg total) by mouth 2 (two) times daily. Patient not taking: Reported on 08/25/2014 07/04/14   Rolland Porter, MD  oxyCODONE-acetaminophen (PERCOCET) 5-325 MG per tablet Take 2 tablets by mouth every 4 (four) hours as needed. Patient not taking: Reported on 08/25/2014 07/08/14   Gilda Crease, MD  predniSONE (DELTASONE) 20 MG tablet 3 tabs po daily x 3 days, then 2 tabs x 3 days, then 1.5 tabs x 3 days, then 1 tab x 3 days, then 0.5 tabs x 3 days Patient not taking: Reported on 08/20/2014 07/08/14   Gilda Crease, MD   BP  131/99 mmHg  Pulse 91  Temp(Src) 100.5 F (38.1 C) (Rectal)  Resp 23  Ht 5\' 9"  (1.753 m)  Wt 226 lb (102.513 kg)  BMI 33.36 kg/m2  SpO2 97%   Physical Exam  Constitutional: He is oriented to person, place, and time. Vital signs are normal. He appears well-developed and well-nourished.  Non-toxic appearance. He does not appear ill. No distress.  HENT:  Head: Normocephalic and atraumatic.  Nose: Nose normal.  Mouth/Throat: Oropharynx is clear and moist. No oropharyngeal exudate.  Eyes: Conjunctivae and EOM are normal. Pupils are equal, round, and reactive to light. No scleral  icterus.  Neck: Normal range of motion. Neck supple. No tracheal deviation, no edema, no erythema and normal range of motion present. No thyroid mass and no thyromegaly present.  Cardiovascular: Normal rate, regular rhythm, S1 normal, S2 normal, normal heart sounds, intact distal pulses and normal pulses.  Exam reveals no gallop and no friction rub.   No murmur heard. Pulses:      Radial pulses are 2+ on the right side, and 2+ on the left side.       Dorsalis pedis pulses are 2+ on the right side, and 2+ on the left side.  Pulmonary/Chest: Effort normal and breath sounds normal. No respiratory distress. He has no wheezes. He has no rhonchi. He has no rales.  Abdominal: Soft. Normal appearance and bowel sounds are normal. He exhibits no distension, no ascites and no mass. There is no hepatosplenomegaly. There is no tenderness. There is no rebound, no guarding and no CVA tenderness.  Musculoskeletal: Normal range of motion. He exhibits no edema or tenderness.  Lymphadenopathy:    He has no cervical adenopathy.  Neurological: He is alert and oriented to person, place, and time. He has normal strength. No cranial nerve deficit or sensory deficit.  decreased sensation to the C7 distrubution in LUE  Skin: Skin is warm, dry and intact. No petechiae and no rash noted. He is not diaphoretic. No erythema. No pallor.  Psychiatric: He has a normal mood and affect. His behavior is normal. Judgment normal.  Nursing note and vitals reviewed.   ED Course  Procedures (including critical care time) DIAGNOSTIC STUDIES: Oxygen Saturation is 97% on RA, normal by my interpretation.    COORDINATION OF CARE: 11:21 PM-Discussed treatment plan with pt at bedside and pt agreed to plan.     Labs Review Labs Reviewed  CBC WITH DIFFERENTIAL/PLATELET - Abnormal; Notable for the following:    WBC 15.9 (*)    Neutrophils Relative % 80 (*)    Neutro Abs 12.7 (*)    All other components within normal limits  BASIC  METABOLIC PANEL - Abnormal; Notable for the following:    Potassium 3.3 (*)    Glucose, Bld 111 (*)    All other components within normal limits  CULTURE, BLOOD (ROUTINE X 2)  CULTURE, BLOOD (ROUTINE X 2)  URINE CULTURE  SEDIMENTATION RATE  TROPONIN I  URINALYSIS, ROUTINE W REFLEX MICROSCOPIC    Imaging Review Mr Cervical Spine W Wo Contrast  08/26/2014   CLINICAL DATA:  Initial evaluation for a 5 day history of worsening back pain status post recent spinal injection in the cervical spine. Fever, nausea, chills.  EXAM: MRI CERVICAL SPINE WITHOUT AND WITH CONTRAST  TECHNIQUE: Multiplanar and multiecho pulse sequences of the cervical spine, to include the craniocervical junction and cervicothoracic junction, were obtained according to standard protocol without and with intravenous contrast.  CONTRAST:  20 cc  of MultiHance.  COMPARISON:  Prior study from 07/19/2014.  FINDINGS: The visualized portions of the brain and posterior fossa demonstrate a normal appearance with normal signal intensity. Craniocervical junction is widely patent.  There is slight reversal of the normal cervical lordosis with apex at C5. No listhesis. Vertebral body heights maintained. No fracture.  Signal intensity within the vertebral body bone marrow is within normal limits. No evidence for osteomyelitis discitis. No abnormal enhancement within the cervical spine.  Signal intensity within the cervical spinal cord is normal. No epidural abscess.  Paraspinous soft tissues are within normal limits. No prevertebral edema. Normal intravascular flow voids present within the vertebral arteries bilaterally.  C2-3:  Negative.  C3-4: Mild bilateral uncovertebral hypertrophy with minimal disc bulge. No significant canal or foraminal stenosis.  C4-5:  Minimal bilateral uncovertebral hypertrophy without stenosis.  C5-6: Again seen is a left paracentral/foraminal disc protrusion, partially indenting the left ventral thecal sac without cord  deformity. Resultant moderate to severe left foraminal narrowing again noted. No significant canal stenosis. Right neural foramen remains widely patent.  C6-7: Bilateral uncovertebral hypertrophy with degenerative disc bulge present. Disc bulge is slightly a centric to the left. Resultant mild canal narrowing present. Moderate to severe left with more moderate right foraminal narrowing also stable.  C7-T1:  Negative.  IMPRESSION: 1. No MRI evidence for active infection or other acute abnormality within the cervical spine. No epidural abscess. 2. Stable left paracentral/foraminal disc protrusion with superimposed uncovertebral hypertrophy at C5-6 with resultant severe left foraminal narrowing. 3. Disc bulge with uncovertebral hypertrophy at C6-7 with resultant moderate to severe left and moderate right foraminal stenosis.   Electronically Signed   By: Rise Mu M.D.   On: 08/26/2014 05:03   Mr Thoracic Spine W Wo Contrast  08/26/2014   CLINICAL DATA:  Initial evaluation for a 5 day history of worsening back pain status post recent spinal injection in the cervical spine. Fever, nausea, chills.  EXAM: MRI THORACIC SPINE WITHOUT AND WITH CONTRAST  TECHNIQUE: Multiplanar and multiecho pulse sequences of the thoracic spine were obtained without and with intravenous contrast.  CONTRAST:  20 cc of MultiHance.  COMPARISON:  None.  FINDINGS: Vertebral bodies are normally aligned with preservation of the normal thoracic kyphosis. Vertebral body heights are preserved. No acute fracture.  Signal intensity within the vertebral body bone marrow is normal. No marrow edema identified. No evidence for osteomyelitis discitis. No abnormal enhancement.  Signal intensity within the thoracic spinal cord is normal. No epidural abscess.  Paraspinous soft tissues within normal limits. Visualized lungs are clear.  Scattered cyst noted within the bilateral kidneys.  Mild degenerative endplate changes seen anteriorly at the T9-10  intervertebral disc space.  At T4-5: There is a small central disc protrusion indenting the ventral thecal sac without significant stenosis.  At T5-6: Small central disc protrusion indents the ventral thecal sac with mild secondary cord flattening and mild stenosis. No foraminal narrowing.  T6-7: Central disc protrusion indents the ventral thecal sac and mildly flattens the thoracic spinal cord. No significant foraminal narrowing.  T10-11: Mild bilateral facet arthrosis present without significant stenosis.  No other significant degenerative changes identified.  IMPRESSION: 1. No MRI evidence for epidural abscess or other active infection within the thoracic spine. No other acute abnormality. 2. Small central disc protrusions at T4-5, T5-6, and T6-7 as above.   Electronically Signed   By: Rise Mu M.D.   On: 08/26/2014 05:29   Mr Lumbar Spine W Wo Contrast  08/26/2014   CLINICAL DATA:  Initial evaluation for a 5 day history of worsening back pain status post recent spinal injection in the cervical spine. Fever, nausea, chills.  EXAM: MRI LUMBAR SPINE WITHOUT AND WITH CONTRAST  TECHNIQUE: Multiplanar and multiecho pulse sequences of the lumbar spine were obtained without and with intravenous contrast.  CONTRAST:  20 cc of MultiHance.  COMPARISON:  Prior study from 07/08/2014.  FINDINGS: For the purposes of this dictation, the lowest well-formed intervertebral disc spaces presumed to be the L5-S1 level, and there presumed to be 5 lumbar type vertebral bodies.  Chronic bilateral pars defects at L5 again seen with associated 8 mm anterolisthesis of L5 on S1, stable. Associated moderate to severe L5-S1 disc height loss with disc desiccation and degenerative vacuum disc phenomenon again seen. Associated endplate changes also similar.  Otherwise, vertebral bodies are normally aligned with preservation of the normal lumbar lordosis. Vertebral body heights are stable. No fracture or acute listhesis.  No  abnormal enhancement.  No evidence for osteomyelitis discitis.  Conus medullaris terminates normally at the L1 level. Signal intensity within the visualized cord is unremarkable. Nerve roots of the cauda equina are within normal limits. No epidural abscess.  Paraspinous soft tissues within normal limits. Scattered renal cyst noted. No retroperitoneal adenopathy. Visualized aorta is unremarkable.  At T11-12 thru L3-4, there is no disc bulge or disc protrusion. No significant canal or foraminal stenosis.  L4-5: Disc desiccation with 2 mm broad-based disc bulge. Mild bilateral facet arthrosis. No significant canal or foraminal stenosis.  L5-S1: Chronic bilateral pars defects at L5 with associated 8 mm of anterolisthesis. Broad-based disc bulge with disc desiccation and intervertebral disc space narrowing. No focal disc herniation. There is associated bilateral facet arthropathy. No canal stenosis. There is moderate to severe bilateral foraminal stenosis related to the anterior slippage.  IMPRESSION: 1. No MRI evidence for epidural abscess or other active infection within the lumbar spine. 2. 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 There is resultant moderate to severe bilateral foraminal stenosis at this level. 3. Mild degenerative disc disease at L4-5 without stenosis.   Electronically Signed   By: Rise Mu M.D.   On: 08/26/2014 05:54   Dg Chest Port 1 View  08/25/2014   CLINICAL DATA:  Nausea and LEFT arm pain. Previous epidural injection.  EXAM: PORTABLE CHEST - 1 VIEW  COMPARISON:  None.  FINDINGS: The heart size and mediastinal contours are within normal limits. Both lungs are clear. The visualized skeletal structures are unremarkable.  IMPRESSION: No active disease.   Electronically Signed   By: Davonna Belling M.D.   On: 08/25/2014 20:42     EKG Interpretation None      MDM   Final diagnoses:  Chest pain  Cervical radicular pain  Chills   ppatient since emergency department  for worsening upper neck pain after an injection. He is febrile here as well to 100.5. I do have concern for a spinal abscess. We will perform MRI of the spine for further evaluation. He was given Tylenol for his fever, Valium for muscle relaxation as well as IV fluids.  MRI does not show any abscess.  Patient was given reglan Rx to take at home as the pain in his back causes a lot of vomiting and therefore weight loss.  He was strongly encouraged to see his nsrugeon withi 3 days for continued management and for planning of his upcoming surgery.  His vital signs remain within his normal  limits and he is safe for DC.  I personally performed the services described in this documentation, which was scribed in my presence. The recorded information has been reviewed and is accurate.   Tomasita CrumbleAdeleke Kyair Ditommaso, MD 08/26/14 [phone]

## 2014-08-25 NOTE — ED Notes (Signed)
Report given to [facility], eta 40 minutes,

## 2014-08-25 NOTE — ED Notes (Signed)
Dr Fayrene FearingJames at bedside, pt given ice chips per request by Dr Fayrene FearingJames,

## 2014-08-25 NOTE — ED Notes (Signed)
Pt and family updated on plan of care, pt states that since the injection pain and numbness radiates to both arms, worse on left than on right, describes no difference with sensation, when asked if pt has noticed any difference in use, pt states that it is hard to tell due to the pain that he is having,

## 2014-08-25 NOTE — ED Notes (Signed)
Ac called for zofran order,

## 2014-08-25 NOTE — ED Notes (Signed)
Pt notified of not being able to have anything to drink or eat, expressed understanding, states that he only had a few ice chips to wet his lips,

## 2014-08-25 NOTE — ED Notes (Signed)
Pt c/o nausea, pain that radiates down left arm since receiving epidural injection on April 18, states that he had spoken with provider that had given the injection and was told to come to er for further evaluation, went to [place], wait was too long, ended up leaving prior to being seen,

## 2014-08-26 ENCOUNTER — Emergency Department (HOSPITAL_COMMUNITY): Payer: BLUE CROSS/BLUE SHIELD

## 2014-08-26 MED ORDER — METOCLOPRAMIDE HCL 10 MG PO TABS: 10.0000 mg | ORAL_TABLET | Freq: Three times a day (TID) | ORAL | Status: DC | PRN

## 2014-08-26 MED ORDER — SODIUM CHLORIDE 0.9 % IV BOLUS (SEPSIS)
1000.0000 mL | Freq: Once | INTRAVENOUS | Status: AC
  Administered 2014-08-26: 1000 mL via INTRAVENOUS

## 2014-08-26 NOTE — ED Notes (Signed)
Pt. Left with all belongings and refused wheelchair 

## 2014-08-26 NOTE — Discharge Instructions (Signed)
Cervical Radiculopathy Eduardo Barnes, your MRI results are below and do not show any abscess.  Continue to take your pain medication at home as needed.  Motrin and Gabapentin may also help with your pain, consult with your neurosurgeon.  CSN neurosurgeon within 3 days for close follow-up. If symptoms worsen come back to emergency department immediately. Thank you.   IMPRESSION: 1. No MRI evidence for active infection or other acute abnormality within the cervical spine. No epidural abscess. 2. Stable left paracentral/foraminal disc protrusion with superimposed uncovertebral hypertrophy at C5-6 with resultant severe left foraminal narrowing. 3. Disc bulge with uncovertebral hypertrophy at C6-7 with resultant moderate to severe left and moderate right foraminal stenosis.  IMPRESSION: 1. No MRI evidence for epidural abscess or other active infection within the thoracic spine. No other acute abnormality. 2. Small central disc protrusions at T4-5, T5-6, and T6-7 as above.  IMPRESSION: 1. No MRI evidence for epidural abscess or other active infection within the lumbar spine. 2. 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 There is resultant moderate to severe bilateral foraminal stenosis at this level. 3. Mild degenerative disc disease at L4-5 without stenosis.  Cervical radiculopathy means a nerve in the neck is pinched or bruised. This can cause pain or loss of feeling (numbness) that runs from your neck to your arm and fingers. HOME CARE   Put ice on the injured or painful area.  Put ice in a plastic bag.  Place a towel between your skin and the bag.  Leave the ice on for 15-20 minutes, 03-04 times a day, or as told by your doctor.  If ice does not help, you can try using heat. Take a warm shower or bath, or use a hot water bottle as told by your doctor.  You may try a gentle neck and shoulder massage.  Use a flat pillow when you sleep.  Only take medicines as told by your  doctor.  Keep all physical therapy visits as told by your doctor.  If you are given a soft collar, wear it as told by your doctor. GET HELP RIGHT AWAY IF:   Your pain gets worse and is not controlled with medicine.  You lose feeling or feel weak in your hand, arm, face, or leg.  You have a fever or stiff neck.  You cannot control when you poop or pee (incontinence).  You have trouble with walking, balance, or speaking. MAKE SURE YOU:   Understand these instructions.  Will watch your condition.  Will get help right away if you are not doing well or get worse. Document Released: 04/09/2011 Document Revised: 07/13/2011 Document Reviewed: 04/09/2011 [facility] Patient Information 2015 [place]. This information is not intended to replace advice given to you by your health care provider. Make sure you discuss any questions you have with your health care provider.

## 2014-08-28 ENCOUNTER — Other Ambulatory Visit: Payer: Self-pay | Admitting: Neurological Surgery

## 2014-08-28 ENCOUNTER — Encounter (HOSPITAL_COMMUNITY): Payer: Self-pay

## 2014-08-28 ENCOUNTER — Encounter (HOSPITAL_COMMUNITY)
Admission: RE | Admit: 2014-08-28 | Discharge: 2014-08-28 | Disposition: A | Discharge status: Home / Self Care | Payer: BLUE CROSS/BLUE SHIELD | Source: Ambulatory Visit | Attending: Neurological Surgery | Admitting: Neurological Surgery

## 2014-08-28 ENCOUNTER — Encounter (HOSPITAL_COMMUNITY): Payer: Self-pay | Admitting: Vascular Surgery

## 2014-08-28 DIAGNOSIS — M542 Cervicalgia: Secondary | ICD-10-CM | POA: Diagnosis present

## 2014-08-28 DIAGNOSIS — E669 Obesity, unspecified: Secondary | ICD-10-CM | POA: Diagnosis not present

## 2014-08-28 DIAGNOSIS — M4802 Spinal stenosis, cervical region: Secondary | ICD-10-CM | POA: Diagnosis not present

## 2014-08-28 DIAGNOSIS — F17210 Nicotine dependence, cigarettes, uncomplicated: Secondary | ICD-10-CM | POA: Diagnosis not present

## 2014-08-28 DIAGNOSIS — Z6833 Body mass index (BMI) 33.0-33.9, adult: Secondary | ICD-10-CM | POA: Diagnosis not present

## 2014-08-28 DIAGNOSIS — M47892 Other spondylosis, cervical region: Secondary | ICD-10-CM | POA: Diagnosis not present

## 2014-08-28 LAB — ABO/RH: ABO/RH(D): A POS

## 2014-08-28 LAB — URINE CULTURE
Colony Count: NO GROWTH
Culture: NO GROWTH

## 2014-08-28 LAB — CBC
HCT: 46 % (ref 39.0–52.0)
Hemoglobin: 15.7 g/dL (ref 13.0–17.0)
MCH: 29.8 pg (ref 26.0–34.0)
MCHC: 34.1 g/dL (ref 30.0–36.0)
MCV: 87.3 fL (ref 78.0–100.0)
Platelets: 313 10*3/uL (ref 150–400)
RBC: 5.27 MIL/uL (ref 4.22–5.81)
RDW: 14.2 % (ref 11.5–15.5)
WBC: 14.1 10*3/uL — AB (ref 4.0–10.5)

## 2014-08-28 LAB — SURGICAL PCR SCREEN
MRSA, PCR: NEGATIVE
STAPHYLOCOCCUS AUREUS: NEGATIVE

## 2014-08-28 LAB — TYPE AND SCREEN
ABO/RH(D): A POS
Antibody Screen: NEGATIVE

## 2014-08-28 NOTE — Pre-Procedure Instructions (Signed)
Eduardo Barnes  08/28/2014   Your procedure is scheduled on: Thursday, August 30, 2014  [facility]  Call this number if you have problems the morning of surgery: [phone]   Remember:   Do not eat food or drink liquids after midnight Wednesday, August 29, 2014   Take these medicines the morning of surgery with A SIP OF WATER: if needed: oxyCODONE-acetaminophen (PERCOCET) for pain, nausea or vomiting medication  (metoCLOPramide (REGLAN)  OR   ondansetron (ZOFRAN)  Stop taking Aspirin,vitamins and herbal medications. Do not take any NSAIDs ie: Ibuprofen, Advil, Naproxen or any medication containing Aspirin; stop now. \  Do not wear jewelry, make-up or nail polish.  Do not wear lotions, powders, or perfumes. You may not wear deodorant.  Do not shave 48 hours prior to surgery. Men may shave face and neck.  Do not bring valuables to the hospital.  [facility] belongings or valuables.               Contacts, dentures or bridgework may not be worn into surgery.  Leave suitcase in the car. After surgery it may be brought to your room.  For patients admitted to the hospital, discharge time is determined by your treatment team.               Patients discharged the day of surgery will not be allowed to drive home.  Name and phone number of your driver:   Special Instructions:  Special Instructions:Special Instructions: [facility]  Before surgery, you can play an important role.  Because skin is not sterile, your skin needs to be as free of germs as possible.  You can reduce the number of germs on you skin by washing with CHG (chlorahexidine gluconate) soap before surgery.  CHG is an antiseptic cleaner which kills germs and bonds with the skin to continue killing germs even after washing.  Please DO NOT use if you have an allergy to CHG or antibacterial soaps.  If your skin becomes reddened/irritated  stop using the CHG and inform your nurse when you arrive at Short Stay.  Do not shave (including legs and underarms) for at least 48 hours prior to the first CHG shower.  You may shave your face.  Please follow these instructions carefully:   1.  Shower with CHG Soap the night before surgery and the morning of Surgery.  2.  If you choose to wash your hair, wash your hair first as usual with your normal shampoo.  3.  After you shampoo, rinse your hair and body thoroughly to remove the Shampoo.  4.  Use CHG as you would any other liquid soap.  You can apply chg directly  to the skin and wash gently with scrungie or a clean washcloth.  5.  Apply the CHG Soap to your body ONLY FROM THE NECK DOWN.  Do not use on open wounds or open sores.  Avoid contact with your eyes, ears, mouth and genitals (private parts).  Wash genitals (private parts) with your normal soap.  6.  Wash thoroughly, paying special attention to the area where your surgery will be performed.  7.  Thoroughly rinse your body with warm water from the neck down.  8.  DO NOT shower/wash with your normal soap after using and rinsing off the CHG Soap.  9.  Pat yourself dry with a clean towel.  10.  Wear clean pajamas.            11.  Place clean sheets on your bed the night of your first shower and do not sleep with pets.  Day of Surgery  Do not apply any lotions/deodorants the morning of surgery.  Please wear clean clothes to the hospital/surgery center.   Please read over the following fact sheets that you were given: Pain Booklet, Coughing and Deep Breathing, Blood Transfusion Information, MRSA Information and Surgical Site Infection Prevention

## 2014-08-28 NOTE — Progress Notes (Signed)
Pt denies SOB, chest pain, and being under the care of a cardiologist. Pt denies having a stress test, echo and cardiac cath. Revonda StandardAllison, PA ( anesthesia) reviewed EKG and labs; CBC repeated.

## 2014-08-29 MED ORDER — CEFAZOLIN SODIUM-DEXTROSE 2-3 GM-% IV SOLR
2.0000 g | INTRAVENOUS | Status: AC
  Administered 2014-08-30: 2 g via INTRAVENOUS

## 2014-08-30 ENCOUNTER — Ambulatory Visit (HOSPITAL_COMMUNITY)
Admission: RE | Admit: 2014-08-30 | Discharge: 2014-08-30 | Disposition: A | Discharge status: Home / Self Care | Payer: BLUE CROSS/BLUE SHIELD | Source: Ambulatory Visit | Attending: Neurological Surgery | Admitting: Neurological Surgery

## 2014-08-30 ENCOUNTER — Encounter (HOSPITAL_COMMUNITY): Payer: Self-pay | Admitting: *Deleted

## 2014-08-30 ENCOUNTER — Inpatient Hospital Stay (HOSPITAL_COMMUNITY): Payer: BLUE CROSS/BLUE SHIELD | Admitting: Anesthesiology

## 2014-08-30 ENCOUNTER — Encounter (HOSPITAL_COMMUNITY)
Admission: RE | Disposition: A | Discharge status: Home / Self Care | Payer: Self-pay | Source: Ambulatory Visit | Attending: Neurological Surgery

## 2014-08-30 ENCOUNTER — Inpatient Hospital Stay (HOSPITAL_COMMUNITY): Payer: BLUE CROSS/BLUE SHIELD

## 2014-08-30 DIAGNOSIS — Z6833 Body mass index (BMI) 33.0-33.9, adult: Secondary | ICD-10-CM | POA: Insufficient documentation

## 2014-08-30 DIAGNOSIS — M47892 Other spondylosis, cervical region: Secondary | ICD-10-CM | POA: Diagnosis not present

## 2014-08-30 DIAGNOSIS — E669 Obesity, unspecified: Secondary | ICD-10-CM | POA: Insufficient documentation

## 2014-08-30 DIAGNOSIS — M4802 Spinal stenosis, cervical region: Secondary | ICD-10-CM | POA: Insufficient documentation

## 2014-08-30 DIAGNOSIS — F17210 Nicotine dependence, cigarettes, uncomplicated: Secondary | ICD-10-CM | POA: Insufficient documentation

## 2014-08-30 DIAGNOSIS — Z981 Arthrodesis status: Secondary | ICD-10-CM

## 2014-08-30 DIAGNOSIS — Z419 Encounter for procedure for purposes other than remedying health state, unspecified: Secondary | ICD-10-CM

## 2014-08-30 HISTORY — PX: ANTERIOR CERVICAL DECOMP/DISCECTOMY FUSION: SHX1161

## 2014-08-30 LAB — CULTURE, BLOOD (ROUTINE X 2)
CULTURE: NO GROWTH
Culture: NO GROWTH

## 2014-08-30 SURGERY — FOR MAXIMUM ACCESS (MAS) POSTERIOR LUMBAR INTERBODY FUSION (PLIF) 2 LEVEL
Anesthesia: General | Site: Back

## 2014-08-30 SURGERY — ANTERIOR CERVICAL DECOMPRESSION/DISCECTOMY FUSION 2 LEVELS
Anesthesia: General | Site: Spine Cervical

## 2014-08-30 MED ORDER — LIDOCAINE HCL (CARDIAC) 20 MG/ML IV SOLN
INTRAVENOUS | Status: AC
  Filled 2014-08-30: qty 5

## 2014-08-30 MED ORDER — FENTANYL CITRATE (PF) 250 MCG/5ML IJ SOLN
INTRAMUSCULAR | Status: AC
  Filled 2014-08-30: qty 5

## 2014-08-30 MED ORDER — ROCURONIUM BROMIDE 100 MG/10ML IV SOLN
INTRAVENOUS | Status: DC | PRN
  Administered 2014-08-30: 50 mg via INTRAVENOUS

## 2014-08-30 MED ORDER — NEOSTIGMINE METHYLSULFATE 10 MG/10ML IV SOLN
INTRAVENOUS | Status: DC | PRN
  Administered 2014-08-30: 3 mg via INTRAVENOUS

## 2014-08-30 MED ORDER — DEXAMETHASONE 4 MG PO TABS: 4.0000 mg | ORAL_TABLET | Freq: Four times a day (QID) | ORAL | Status: DC

## 2014-08-30 MED ORDER — SUCCINYLCHOLINE CHLORIDE 20 MG/ML IJ SOLN
INTRAMUSCULAR | Status: AC
  Filled 2014-08-30: qty 1

## 2014-08-30 MED ORDER — HYDROMORPHONE HCL 1 MG/ML IJ SOLN
0.2500 mg | INTRAMUSCULAR | Status: DC | PRN
  Administered 2014-08-30 (×4): 0.5 mg via INTRAVENOUS

## 2014-08-30 MED ORDER — DEXAMETHASONE SODIUM PHOSPHATE 10 MG/ML IJ SOLN
INTRAMUSCULAR | Status: DC | PRN
  Administered 2014-08-30: 10 mg via INTRAVENOUS

## 2014-08-30 MED ORDER — OXYCODONE HCL 5 MG PO TABS: 5.0000 mg | ORAL_TABLET | ORAL | Status: DC | PRN

## 2014-08-30 MED ORDER — DEXAMETHASONE SODIUM PHOSPHATE 4 MG/ML IJ SOLN
4.0000 mg | Freq: Four times a day (QID) | INTRAMUSCULAR | Status: DC
  Administered 2014-08-30: 4 mg via INTRAVENOUS
  Filled 2014-08-30: qty 1

## 2014-08-30 MED ORDER — 0.9 % SODIUM CHLORIDE (POUR BTL) OPTIME
TOPICAL | Status: DC | PRN
  Administered 2014-08-30: 1000 mL

## 2014-08-30 MED ORDER — OXYCODONE-ACETAMINOPHEN 10-325 MG PO TABS
1.0000 | ORAL_TABLET | ORAL | Status: DC | PRN
  Administered 2014-08-30: 1 via ORAL

## 2014-08-30 MED ORDER — SODIUM CHLORIDE 0.9 % IR SOLN
Status: DC | PRN
  Administered 2014-08-30: 500 mL

## 2014-08-30 MED ORDER — DIAZEPAM 5 MG PO TABS: 5.0000 mg | ORAL_TABLET | Freq: Four times a day (QID) | ORAL | Status: DC | PRN

## 2014-08-30 MED ORDER — METHOCARBAMOL 500 MG PO TABS: 500.0000 mg | ORAL_TABLET | Freq: Three times a day (TID) | ORAL | Status: DC | PRN

## 2014-08-30 MED ORDER — ROCURONIUM BROMIDE 50 MG/5ML IV SOLN
INTRAVENOUS | Status: AC
Start: 2014-08-30 — End: 2014-08-30
  Filled 2014-08-30: qty 1

## 2014-08-30 MED ORDER — MENTHOL 3 MG MT LOZG: 1.0000 | LOZENGE | OROMUCOSAL | Status: DC | PRN

## 2014-08-30 MED ORDER — HYDROMORPHONE HCL 1 MG/ML IJ SOLN
INTRAMUSCULAR | Status: AC
  Administered 2014-08-30: 1 mg via INTRAVENOUS
  Administered 2014-08-30: 0.5 mg via INTRAVENOUS
  Filled 2014-08-30: qty 2

## 2014-08-30 MED ORDER — PHENOL 1.4 % MT LIQD: 1.0000 | OROMUCOSAL | Status: DC | PRN

## 2014-08-30 MED ORDER — PROPOFOL 10 MG/ML IV BOLUS
INTRAVENOUS | Status: DC | PRN
  Administered 2014-08-30: 200 mg via INTRAVENOUS

## 2014-08-30 MED ORDER — GLYCOPYRROLATE 0.2 MG/ML IJ SOLN
INTRAMUSCULAR | Status: DC | PRN
  Administered 2014-08-30: 0.4 mg via INTRAVENOUS

## 2014-08-30 MED ORDER — FENTANYL CITRATE (PF) 100 MCG/2ML IJ SOLN
INTRAMUSCULAR | Status: DC | PRN
  Administered 2014-08-30: 150 ug via INTRAVENOUS
  Administered 2014-08-30 (×6): 100 ug via INTRAVENOUS

## 2014-08-30 MED ORDER — POTASSIUM CHLORIDE IN NACL 20-0.9 MEQ/L-% IV SOLN
INTRAVENOUS | Status: DC
  Filled 2014-08-30 (×2): qty 1000

## 2014-08-30 MED ORDER — DEXAMETHASONE SODIUM PHOSPHATE 10 MG/ML IJ SOLN
INTRAMUSCULAR | Status: AC
  Filled 2014-08-30: qty 1

## 2014-08-30 MED ORDER — KETOROLAC TROMETHAMINE 30 MG/ML IJ SOLN: 30.0000 mg | Freq: Once | INTRAMUSCULAR | Status: DC | PRN

## 2014-08-30 MED ORDER — LACTATED RINGERS IV SOLN
INTRAVENOUS | Status: DC | PRN
  Administered 2014-08-30 (×2): via INTRAVENOUS

## 2014-08-30 MED ORDER — HYDROMORPHONE HCL 1 MG/ML IJ SOLN
INTRAMUSCULAR | Status: AC
  Filled 2014-08-30: qty 1

## 2014-08-30 MED ORDER — SODIUM CHLORIDE 0.9 % IJ SOLN: 3.0000 mL | INTRAMUSCULAR | Status: DC | PRN

## 2014-08-30 MED ORDER — EPHEDRINE SULFATE 50 MG/ML IJ SOLN
INTRAMUSCULAR | Status: AC
  Filled 2014-08-30: qty 1

## 2014-08-30 MED ORDER — KETOROLAC TROMETHAMINE 30 MG/ML IJ SOLN
INTRAMUSCULAR | Status: AC
  Filled 2014-08-30: qty 1

## 2014-08-30 MED ORDER — ACETAMINOPHEN 650 MG RE SUPP
650.0000 mg | RECTAL | Status: DC | PRN
Start: 2014-08-30 — End: 2014-08-30

## 2014-08-30 MED ORDER — SODIUM CHLORIDE 0.9 % IJ SOLN
INTRAMUSCULAR | Status: AC
  Filled 2014-08-30: qty 10

## 2014-08-30 MED ORDER — ONDANSETRON HCL 4 MG/2ML IJ SOLN
4.0000 mg | INTRAMUSCULAR | Status: DC | PRN
  Administered 2014-08-30: 4 mg via INTRAVENOUS
  Filled 2014-08-30: qty 2

## 2014-08-30 MED ORDER — DIAZEPAM 5 MG PO TABS
ORAL_TABLET | ORAL | Status: AC
  Filled 2014-08-30: qty 1

## 2014-08-30 MED ORDER — PHENYLEPHRINE 40 MCG/ML (10ML) SYRINGE FOR IV PUSH (FOR BLOOD PRESSURE SUPPORT)
PREFILLED_SYRINGE | INTRAVENOUS | Status: AC
  Filled 2014-08-30: qty 10

## 2014-08-30 MED ORDER — OXYCODONE-ACETAMINOPHEN 5-325 MG PO TABS: 1.0000 | ORAL_TABLET | ORAL | Status: DC | PRN

## 2014-08-30 MED ORDER — THROMBIN 5000 UNITS EX SOLR
CUTANEOUS | Status: DC | PRN
  Administered 2014-08-30 (×2): 5000 [IU] via TOPICAL

## 2014-08-30 MED ORDER — ACETAMINOPHEN 10 MG/ML IV SOLN
INTRAVENOUS | Status: AC
  Administered 2014-08-30: 1000 mg via INTRAVENOUS
  Filled 2014-08-30: qty 100

## 2014-08-30 MED ORDER — ONDANSETRON HCL 4 MG/2ML IJ SOLN
INTRAMUSCULAR | Status: AC
  Filled 2014-08-30: qty 2

## 2014-08-30 MED ORDER — PROMETHAZINE HCL 25 MG/ML IJ SOLN: 6.2500 mg | INTRAMUSCULAR | Status: DC | PRN

## 2014-08-30 MED ORDER — MEPERIDINE HCL 25 MG/ML IJ SOLN: 6.2500 mg | INTRAMUSCULAR | Status: DC | PRN

## 2014-08-30 MED ORDER — LABETALOL HCL 5 MG/ML IV SOLN
INTRAVENOUS | Status: DC | PRN
  Administered 2014-08-30: 10 mg via INTRAVENOUS

## 2014-08-30 MED ORDER — LIDOCAINE HCL (CARDIAC) 20 MG/ML IV SOLN
INTRAVENOUS | Status: DC | PRN
  Administered 2014-08-30: 50 mg via INTRAVENOUS

## 2014-08-30 MED ORDER — MIDAZOLAM HCL 5 MG/5ML IJ SOLN
INTRAMUSCULAR | Status: DC | PRN
  Administered 2014-08-30 (×2): 2 mg via INTRAVENOUS

## 2014-08-30 MED ORDER — SODIUM CHLORIDE 0.9 % IV SOLN
250.0000 mg | INTRAVENOUS | Status: DC | PRN
  Administered 2014-08-30: 10 ug/kg/min via INTRAVENOUS

## 2014-08-30 MED ORDER — ONDANSETRON HCL 4 MG/2ML IJ SOLN
INTRAMUSCULAR | Status: DC | PRN
  Administered 2014-08-30: 4 mg via INTRAVENOUS

## 2014-08-30 MED ORDER — CEFAZOLIN SODIUM 1-5 GM-% IV SOLN
1.0000 g | Freq: Three times a day (TID) | INTRAVENOUS | Status: DC
Start: 2014-08-30 — End: 2014-08-30
  Filled 2014-08-30 (×2): qty 50

## 2014-08-30 MED ORDER — DIAZEPAM 5 MG PO TABS: 5.0000 mg | ORAL_TABLET | Freq: Three times a day (TID) | ORAL | Status: DC | PRN

## 2014-08-30 MED ORDER — MORPHINE SULFATE 2 MG/ML IJ SOLN
1.0000 mg | INTRAMUSCULAR | Status: DC | PRN
  Administered 2014-08-30: 2 mg via INTRAVENOUS

## 2014-08-30 MED ORDER — SODIUM CHLORIDE 0.9 % IJ SOLN
3.0000 mL | Freq: Two times a day (BID) | INTRAMUSCULAR | Status: DC
  Administered 2014-08-30: 3 mL via INTRAVENOUS

## 2014-08-30 MED ORDER — PROPOFOL 10 MG/ML IV BOLUS
INTRAVENOUS | Status: AC
  Filled 2014-08-30: qty 20

## 2014-08-30 MED ORDER — THROMBIN 5000 UNITS EX SOLR
CUTANEOUS | Status: DC | PRN
  Administered 2014-08-30: 5 mL via TOPICAL

## 2014-08-30 MED ORDER — DIAZEPAM 5 MG PO TABS
5.0000 mg | ORAL_TABLET | Freq: Four times a day (QID) | ORAL | Status: DC | PRN
  Administered 2014-08-30: 5 mg via ORAL

## 2014-08-30 MED ORDER — KETAMINE HCL 100 MG/ML IJ SOLN
INTRAMUSCULAR | Status: AC
  Administered 2014-08-30: 50 mg via INTRAVENOUS
  Filled 2014-08-30: qty 1

## 2014-08-30 MED ORDER — ACETAMINOPHEN 325 MG PO TABS: 650.0000 mg | ORAL_TABLET | ORAL | Status: DC | PRN

## 2014-08-30 MED ORDER — ACETAMINOPHEN 10 MG/ML IV SOLN: 1000.0000 mg | Freq: Once | INTRAVENOUS | Status: DC

## 2014-08-30 MED ORDER — MIDAZOLAM HCL 2 MG/2ML IJ SOLN
INTRAMUSCULAR | Status: AC
  Filled 2014-08-30: qty 2

## 2014-08-30 MED ORDER — BUPIVACAINE HCL (PF) 0.25 % IJ SOLN
INTRAMUSCULAR | Status: DC | PRN
  Administered 2014-08-30: 4 mL

## 2014-08-30 MED ORDER — OXYCODONE-ACETAMINOPHEN 5-325 MG PO TABS
ORAL_TABLET | ORAL | Status: AC
  Filled 2014-08-30: qty 2

## 2014-08-30 MED ORDER — HEMOSTATIC AGENTS (NO CHARGE) OPTIME
TOPICAL | Status: DC | PRN
  Administered 2014-08-30: 1 via TOPICAL

## 2014-08-30 MED ORDER — MORPHINE SULFATE 2 MG/ML IJ SOLN
INTRAMUSCULAR | Status: AC
  Filled 2014-08-30: qty 1

## 2014-08-30 SURGICAL SUPPLY — 52 items
BAG DECANTER FOR FLEXI CONT (MISCELLANEOUS) ×2 IMPLANT
BENZOIN TINCTURE PRP APPL 2/3 (GAUZE/BANDAGES/DRESSINGS) ×2 IMPLANT
BUR MATCHSTICK NEURO 3.0 LAGG (BURR) ×2 IMPLANT
CAGE COROENT SM 6X13X15 (Cage) ×2 IMPLANT
CAGE SMALL 7X13X15 (Cage) ×2 IMPLANT
CANISTER SUCT 3000ML PPV (MISCELLANEOUS) ×2 IMPLANT
CONT SPEC 4OZ CLIKSEAL STRL BL (MISCELLANEOUS) ×2 IMPLANT
DRAPE C-ARM 42X72 X-RAY (DRAPES) ×4 IMPLANT
DRAPE LAPAROTOMY 100X72 PEDS (DRAPES) ×2 IMPLANT
DRAPE MICROSCOPE LEICA (MISCELLANEOUS) ×2 IMPLANT
DRAPE POUCH INSTRU U-SHP 10X18 (DRAPES) ×2 IMPLANT
DRILL BIT HELIX (BIT) ×2 IMPLANT
DRSG OPSITE 4X5.5 SM (GAUZE/BANDAGES/DRESSINGS) IMPLANT
DRSG OPSITE POSTOP 3X4 (GAUZE/BANDAGES/DRESSINGS) ×2 IMPLANT
DRSG TELFA 3X8 NADH (GAUZE/BANDAGES/DRESSINGS) IMPLANT
DURAPREP 6ML APPLICATOR 50/CS (WOUND CARE) ×2 IMPLANT
ELECT COATED BLADE 2.86 ST (ELECTRODE) ×2 IMPLANT
ELECT REM PT RETURN 9FT ADLT (ELECTROSURGICAL) ×2
ELECTRODE REM PT RTRN 9FT ADLT (ELECTROSURGICAL) ×1 IMPLANT
GAUZE SPONGE 4X4 16PLY XRAY LF (GAUZE/BANDAGES/DRESSINGS) IMPLANT
GLOVE BIO SURGEON STRL SZ7 (GLOVE) ×2 IMPLANT
GLOVE BIO SURGEON STRL SZ8 (GLOVE) ×2 IMPLANT
GLOVE BIOGEL PI IND STRL 7.5 (GLOVE) ×1 IMPLANT
GLOVE BIOGEL PI INDICATOR 7.5 (GLOVE) ×1
GLOVE SURG SS PI 7.0 STRL IVOR (GLOVE) ×4 IMPLANT
GOWN STRL REUS W/ TWL LRG LVL3 (GOWN DISPOSABLE) IMPLANT
GOWN STRL REUS W/ TWL XL LVL3 (GOWN DISPOSABLE) ×2 IMPLANT
GOWN STRL REUS W/TWL 2XL LVL3 (GOWN DISPOSABLE) IMPLANT
GOWN STRL REUS W/TWL LRG LVL3 (GOWN DISPOSABLE)
GOWN STRL REUS W/TWL XL LVL3 (GOWN DISPOSABLE) ×2
HEMOSTAT POWDER KIT SURGIFOAM (HEMOSTASIS) ×2 IMPLANT
KIT BASIN OR (CUSTOM PROCEDURE TRAY) ×2 IMPLANT
KIT ROOM TURNOVER OR (KITS) ×2 IMPLANT
NEEDLE HYPO 25X1 1.5 SAFETY (NEEDLE) ×2 IMPLANT
NEEDLE SPNL 20GX3.5 QUINCKE YW (NEEDLE) ×2 IMPLANT
NS IRRIG 1000ML POUR BTL (IV SOLUTION) ×2 IMPLANT
PACK LAMINECTOMY NEURO (CUSTOM PROCEDURE TRAY) ×2 IMPLANT
PAD ARMBOARD 7.5X6 YLW CONV (MISCELLANEOUS) ×6 IMPLANT
PIN DISTRACTION 14MM (PIN) ×8 IMPLANT
PLATE ARCHON 2-LEVEL 38MM (Plate) ×2 IMPLANT
PUTTY BONE DBX 2.5 MIS (Bone Implant) ×2 IMPLANT
RUBBERBAND STERILE (MISCELLANEOUS) ×4 IMPLANT
SCREW ARCHON SELFTAP 4.0X13 (Screw) ×12 IMPLANT
SPONGE INTESTINAL PEANUT (DISPOSABLE) ×2 IMPLANT
SPONGE SURGIFOAM ABS GEL SZ50 (HEMOSTASIS) ×2 IMPLANT
STRIP CLOSURE SKIN 1/2X4 (GAUZE/BANDAGES/DRESSINGS) ×2 IMPLANT
SUT VIC AB 3-0 SH 8-18 (SUTURE) ×4 IMPLANT
SYR 20ML ECCENTRIC (SYRINGE) ×2 IMPLANT
TOWEL OR 17X24 6PK STRL BLUE (TOWEL DISPOSABLE) ×2 IMPLANT
TOWEL OR 17X26 10 PK STRL BLUE (TOWEL DISPOSABLE) ×2 IMPLANT
TRAP SPECIMEN MUCOUS 40CC (MISCELLANEOUS) ×2 IMPLANT
WATER STERILE IRR 1000ML POUR (IV SOLUTION) ×2 IMPLANT

## 2014-08-30 NOTE — Anesthesia Preprocedure Evaluation (Signed)
Anesthesia Evaluation  Patient identified by MRN, date of birth, ID band Patient awake    Reviewed: Allergy & Precautions, NPO status , Patient's Chart, lab work & pertinent test results  Airway Mallampati: II  TM Distance: >3 FB Neck ROM: Full    Dental no notable dental hx.    Pulmonary Current Smoker,  breath sounds clear to auscultation  Pulmonary exam normal + decreased breath sounds      Cardiovascular negative cardio ROS  Rhythm:Regular Rate:Normal     Neuro/Psych negative neurological ROS  negative psych ROS   GI/Hepatic negative GI ROS, Neg liver ROS,   Endo/Other  negative endocrine ROS  Renal/GU negative Renal ROS     Musculoskeletal  (+) Arthritis -,   Abdominal (+) + obese,   Peds  Hematology negative hematology ROS (+)   Anesthesia Other Findings   Reproductive/Obstetrics                             Anesthesia Physical Anesthesia Plan  ASA: II  Anesthesia Plan: General   Post-op Pain Management:    Induction: Intravenous  Airway Management Planned: Oral ETT  Additional Equipment: None  Intra-op Plan:   Post-operative Plan: Extubation in OR  Informed Consent: I have reviewed the patients History and Physical, chart, labs and discussed the procedure including the risks, benefits and alternatives for the proposed anesthesia with the patient or authorized representative who has indicated his/her understanding and acceptance.   Dental advisory given  Plan Discussed with: CRNA  Anesthesia Plan Comments:         Anesthesia Quick Evaluation

## 2014-08-30 NOTE — H&P (Signed)
Subjective:   Patient is a 39 y.o. male admitted for ACDF. The patient first presented to me with complaints of neck and L arm pain. Onset of symptoms was months ago. The pain is described as severe ache and occurs constantly. The pain is rated intense, and is located in the neck and radiates to the LUE. The symptoms have been progressive. Symptoms are exacerbated by anything, and are relieved by nothing.  Previous work up includes MRI.Marland Kitchen.  Past Medical History  Diagnosis Date  . Chronic back pain   . DDD (degenerative disc disease), lumbar     Past Surgical History  Procedure Laterality Date  . Tonsillectomy      No Known Allergies  History  Substance Use Topics  . Smoking status: Current Every Day Smoker -- 0.50 packs/day    Types: Cigarettes  . Smokeless tobacco: Not on file  . Alcohol Use: No    Family History  Problem Relation Age of Onset  . Heart disease Mother   . Heart attack Father    Prior to Admission medications   Medication Sig Start Date End Date Taking? Authorizing Provider  diazepam (VALIUM) 5 MG tablet Take 1 tablet (5 mg total) by mouth every 8 (eight) hours as needed for anxiety. 07/08/14  Yes Gilda Creasehristopher J Pollina, MD  diphenhydrAMINE (BENADRYL) 25 MG tablet Take 50 mg by mouth every 6 (six) hours as needed for allergies.   Yes Historical Provider, MD  metoCLOPramide (REGLAN) 10 MG tablet Take 1 tablet (10 mg total) by mouth every 8 (eight) hours as needed for nausea or vomiting. 08/26/14  Yes Tomasita CrumbleAdeleke Oni, MD  naproxen (NAPROSYN) 500 MG tablet Take 1 tablet (500 mg total) by mouth 2 (two) times daily. 07/04/14  Yes Rolland PorterMark James, MD  ondansetron (ZOFRAN) 4 MG tablet Take 4 mg by mouth every 8 (eight) hours as needed for nausea or vomiting.   Yes Historical Provider, MD  oxyCODONE-acetaminophen (PERCOCET) 10-325 MG per tablet Take 1 tablet by mouth every 4 (four) hours as needed for pain.   Yes Historical Provider, MD  methocarbamol (ROBAXIN) 500 MG tablet Take 1 tablet  (500 mg total) by mouth 3 (three) times daily between meals as needed. Patient not taking: Reported on 08/20/2014 07/04/14   Rolland PorterMark James, MD  oxyCODONE-acetaminophen (PERCOCET) 5-325 MG per tablet Take 2 tablets by mouth every 4 (four) hours as needed. Patient not taking: Reported on 08/25/2014 07/08/14   Gilda Creasehristopher J Pollina, MD  predniSONE (DELTASONE) 20 MG tablet 3 tabs po daily x 3 days, then 2 tabs x 3 days, then 1.5 tabs x 3 days, then 1 tab x 3 days, then 0.5 tabs x 3 days Patient not taking: Reported on 08/20/2014 07/08/14   Gilda Creasehristopher J Pollina, MD     Review of Systems  Positive ROS: back pain  All other systems have been reviewed and were otherwise negative with the exception of those mentioned in the HPI and as above.  Objective: Vital signs in last 24 hours: Temp:  [98 F (36.7 C)] 98 F (36.7 C) (04/28 0639) Pulse Rate:  [72] 72 (04/28 0639) Resp:  [18] 18 (04/28 0639) BP: (124)/(64) 124/64 mmHg (04/28 0639) SpO2:  [96 %] 96 % (04/28 0639) Weight:  [229 lb 4.8 oz (104.01 kg)] 229 lb 4.8 oz (104.01 kg) (04/28 [id number])  General Appearance: Alert, cooperative, no distress, appears stated age Head: Normocephalic, without obvious abnormality, atraumatic Eyes: PERRL, conjunctiva/corneas clear, EOM's intact      Neck: Supple, symmetrical,  trachea midline, Back: Symmetric, no curvature, ROM normal, no CVA tenderness Lungs:  respirations unlabored Heart: Regular rate and rhythm Abdomen: Soft, non-tender Extremities: Extremities normal, atraumatic, no cyanosis or edema Pulses: 2+ and symmetric all extremities Skin: Skin color, texture, turgor normal, no rashes or lesions  NEUROLOGIC:  Mental status: Alert and oriented x4, no aphasia, good attention span, fund of knowledge and memory  Motor Exam - grossly normal Sensory Exam - grossly normal Reflexes: 1+ Coordination - grossly normal Gait - grossly normal Balance - grossly normal Cranial Nerves: I: smell Not tested  II: visual  acuity  OS: nl    OD: nl  II: visual fields Full to confrontation  II: pupils Equal, round, reactive to light  III,VII: ptosis None  III,IV,VI: extraocular muscles  Full ROM  V: mastication Normal  V: facial light touch sensation  Normal  V,VII: corneal reflex  Present  VII: facial muscle function - upper  Normal  VII: facial muscle function - lower Normal  VIII: hearing Not tested  IX: soft palate elevation  Normal  IX,X: gag reflex Present  XI: trapezius strength  5/5  XI: sternocleidomastoid strength 5/5  XI: neck flexion strength  5/5  XII: tongue strength  Normal    Data Review Lab Results  Component Value Date   WBC 14.1* 08/28/2014   HGB 15.7 08/28/2014   HCT 46.0 08/28/2014   MCV 87.3 08/28/2014   PLT 313 08/28/2014   Lab Results  Component Value Date   NA 138 08/25/2014   K 3.3* 08/25/2014   CL 105 08/25/2014   CO2 26 08/25/2014   BUN 13 08/25/2014   CREATININE 1.01 08/25/2014   GLUCOSE 111* 08/25/2014   No results found for: INR, PROTIME  Assessment:   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 Patient has failed conservative therapy. Planned surgery :ACDF C5-6, C6-7  Plan:   I explained the condition and procedure to the patient and answered any questions.  Patient wishes to proceed with procedure as planned. Understands risks/ benefits/ and expected or typical outcomes.  Eduardo Barnes S 08/30/2014 6:43 AM

## 2014-08-30 NOTE — Discharge Summary (Signed)
Physician Discharge Summary  Patient ID: Eduardo Barnes MRN: [id number] DOB/AGE: [birth year] 39 y.o.  Admit date: 08/30/2014 Discharge date: 08/30/2014  Admission Diagnoses: cervical stenosis   Discharge Diagnoses: same   Discharged Condition: good  Hospital Course: The patient was admitted on 08/30/2014 and taken to the operating room where the patient underwent ACDF with PLATE M0-1C5-6, U2-7C6-7. The patient tolerated the procedure well and was taken to the recovery room and then to the floor in stable condition. The hospital course was routine. There were no complications. The wound remained clean dry and intact. Pt had appropriate neck soreness. No complaints of arm pain or new N/T/W. The patient remained afebrile with stable vital signs, and tolerated a regular diet. The patient continued to increase activities, and pain was well controlled with oral pain medications.   Consults: None  Significant Diagnostic Studies:  Results for orders placed or performed during the hospital encounter of 08/28/14  Surgical pcr screen  Result Value Ref Range   MRSA, PCR NEGATIVE NEGATIVE   Staphylococcus aureus NEGATIVE NEGATIVE  CBC  Result Value Ref Range   WBC 14.1 (H) 4.0 - 10.5 K/uL   RBC 5.27 4.22 - 5.81 MIL/uL   Hemoglobin 15.7 13.0 - 17.0 g/dL   HCT 25.346.0 66.439.0 - 40.352.0 %   MCV 87.3 78.0 - 100.0 fL   MCH 29.8 26.0 - 34.0 pg   MCHC 34.1 30.0 - 36.0 g/dL   RDW 47.414.2 25.911.5 - 56.315.5 %   Platelets 313 150 - 400 K/uL  Type and screen  Result Value Ref Range   ABO/RH(D) A POS    Antibody Screen NEG    Sample Expiration 09/11/2014   ABO/Rh  Result Value Ref Range   ABO/RH(D) A POS     Dg Cervical Spine 2-3 Views  08/30/2014   CLINICAL DATA:  C5-7 anterior fixation.  EXAM: DG C-ARM 61-120 MIN; CERVICAL SPINE - 2-3 VIEW  COMPARISON:  08/26/2014 MRI.  FINDINGS: Two intraoperative views. These demonstrate anterior plate and screw fixation at C5-7. Suboptimal visualization of lower cervical levels  secondary to overlying soft tissues and patient size. No gross acute hardware complication identified.  IMPRESSION: Intraoperative imaging of C5-7 anterior fixation.   Electronically Signed   By: Jeronimo GreavesKyle  Talbot M.D.   On: 08/30/2014 09:42   Mr Cervical Spine W Wo Contrast  08/26/2014   CLINICAL DATA:  Initial evaluation for a 5 day history of worsening back pain status post recent spinal injection in the cervical spine. Fever, nausea, chills.  EXAM: MRI CERVICAL SPINE WITHOUT AND WITH CONTRAST  TECHNIQUE: Multiplanar and multiecho pulse sequences of the cervical spine, to include the craniocervical junction and cervicothoracic junction, were obtained according to standard protocol without and with intravenous contrast.  CONTRAST:  20 cc of MultiHance.  COMPARISON:  Prior study from 07/19/2014.  FINDINGS: The visualized portions of the brain and posterior fossa demonstrate a normal appearance with normal signal intensity. Craniocervical junction is widely patent.  There is slight reversal of the normal cervical lordosis with apex at C5. No listhesis. Vertebral body heights maintained. No fracture.  Signal intensity within the vertebral body bone marrow is within normal limits. No evidence for osteomyelitis discitis. No abnormal enhancement within the cervical spine.  Signal intensity within the cervical spinal cord is normal. No epidural abscess.  Paraspinous soft tissues are within normal limits. No prevertebral edema. Normal intravascular flow voids present within the vertebral arteries bilaterally.  C2-3:  Negative.  C3-4: Mild bilateral uncovertebral hypertrophy  with minimal disc bulge. No significant canal or foraminal stenosis.  C4-5:  Minimal bilateral uncovertebral hypertrophy without stenosis.  C5-6: Again seen is a left paracentral/foraminal disc protrusion, partially indenting the left ventral thecal sac without cord deformity. Resultant moderate to severe left foraminal narrowing again noted. No  significant canal stenosis. Right neural foramen remains widely patent.  C6-7: Bilateral uncovertebral hypertrophy with degenerative disc bulge present. Disc bulge is slightly a centric to the left. Resultant mild canal narrowing present. Moderate to severe left with more moderate right foraminal narrowing also stable.  C7-T1:  Negative.  IMPRESSION: 1. No MRI evidence for active infection or other acute abnormality within the cervical spine. No epidural abscess. 2. Stable left paracentral/foraminal disc protrusion with superimposed uncovertebral hypertrophy at C5-6 with resultant severe left foraminal narrowing. 3. Disc bulge with uncovertebral hypertrophy at C6-7 with resultant moderate to severe left and moderate right foraminal stenosis.   Electronically Signed   By: Rise Mu M.D.   On: 08/26/2014 05:03   Mr Thoracic Spine W Wo Contrast  08/26/2014   CLINICAL DATA:  Initial evaluation for a 5 day history of worsening back pain status post recent spinal injection in the cervical spine. Fever, nausea, chills.  EXAM: MRI THORACIC SPINE WITHOUT AND WITH CONTRAST  TECHNIQUE: Multiplanar and multiecho pulse sequences of the thoracic spine were obtained without and with intravenous contrast.  CONTRAST:  20 cc of MultiHance.  COMPARISON:  None.  FINDINGS: Vertebral bodies are normally aligned with preservation of the normal thoracic kyphosis. Vertebral body heights are preserved. No acute fracture.  Signal intensity within the vertebral body bone marrow is normal. No marrow edema identified. No evidence for osteomyelitis discitis. No abnormal enhancement.  Signal intensity within the thoracic spinal cord is normal. No epidural abscess.  Paraspinous soft tissues within normal limits. Visualized lungs are clear.  Scattered cyst noted within the bilateral kidneys.  Mild degenerative endplate changes seen anteriorly at the T9-10 intervertebral disc space.  At T4-5: There is a small central disc protrusion  indenting the ventral thecal sac without significant stenosis.  At T5-6: Small central disc protrusion indents the ventral thecal sac with mild secondary cord flattening and mild stenosis. No foraminal narrowing.  T6-7: Central disc protrusion indents the ventral thecal sac and mildly flattens the thoracic spinal cord. No significant foraminal narrowing.  T10-11: Mild bilateral facet arthrosis present without significant stenosis.  No other significant degenerative changes identified.  IMPRESSION: 1. No MRI evidence for epidural abscess or other active infection within the thoracic spine. No other acute abnormality. 2. Small central disc protrusions at T4-5, T5-6, and T6-7 as above.   Electronically Signed   By: Rise Mu M.D.   On: 08/26/2014 05:29   Mr Lumbar Spine W Wo Contrast  08/26/2014   CLINICAL DATA:  Initial evaluation for a 5 day history of worsening back pain status post recent spinal injection in the cervical spine. Fever, nausea, chills.  EXAM: MRI LUMBAR SPINE WITHOUT AND WITH CONTRAST  TECHNIQUE: Multiplanar and multiecho pulse sequences of the lumbar spine were obtained without and with intravenous contrast.  CONTRAST:  20 cc of MultiHance.  COMPARISON:  Prior study from 07/08/2014.  FINDINGS: For the purposes of this dictation, the lowest well-formed intervertebral disc spaces presumed to be the L5-S1 level, and there presumed to be 5 lumbar type vertebral bodies.  Chronic bilateral pars defects at L5 again seen with associated 8 mm anterolisthesis of L5 on S1, stable. Associated moderate to severe L5-S1  disc height loss with disc desiccation and degenerative vacuum disc phenomenon again seen. Associated endplate changes also similar.  Otherwise, vertebral bodies are normally aligned with preservation of the normal lumbar lordosis. Vertebral body heights are stable. No fracture or acute listhesis.  No abnormal enhancement.  No evidence for osteomyelitis discitis.  Conus medullaris  terminates normally at the L1 level. Signal intensity within the visualized cord is unremarkable. Nerve roots of the cauda equina are within normal limits. No epidural abscess.  Paraspinous soft tissues within normal limits. Scattered renal cyst noted. No retroperitoneal adenopathy. Visualized aorta is unremarkable.  At T11-12 thru L3-4, there is no disc bulge or disc protrusion. No significant canal or foraminal stenosis.  L4-5: Disc desiccation with 2 mm broad-based disc bulge. Mild bilateral facet arthrosis. No significant canal or foraminal stenosis.  L5-S1: Chronic bilateral pars defects at L5 with associated 8 mm of anterolisthesis. Broad-based disc bulge with disc desiccation and intervertebral disc space narrowing. No focal disc herniation. There is associated bilateral facet arthropathy. No canal stenosis. There is moderate to severe bilateral foraminal stenosis related to the anterior slippage.  IMPRESSION: 1. No MRI evidence for epidural abscess or other active infection within the lumbar spine. 2. 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 There is resultant moderate to severe bilateral foraminal stenosis at this level. 3. Mild degenerative disc disease at L4-5 without stenosis.   Electronically Signed   By: Rise Mu M.D.   On: 08/26/2014 05:54   Dg Chest Port 1 View  08/25/2014   CLINICAL DATA:  Nausea and LEFT arm pain. Previous epidural injection.  EXAM: PORTABLE CHEST - 1 VIEW  COMPARISON:  None.  FINDINGS: The heart size and mediastinal contours are within normal limits. Both lungs are clear. The visualized skeletal structures are unremarkable.  IMPRESSION: No active disease.   Electronically Signed   By: Davonna Belling M.D.   On: 08/25/2014 20:42   Dg C-arm 1-60 Min  08/30/2014   CLINICAL DATA:  C5-7 anterior fixation.  EXAM: DG C-ARM 61-120 MIN; CERVICAL SPINE - 2-3 VIEW  COMPARISON:  08/26/2014 MRI.  FINDINGS: Two intraoperative views. These demonstrate anterior plate and  screw fixation at C5-7. Suboptimal visualization of lower cervical levels secondary to overlying soft tissues and patient size. No gross acute hardware complication identified.  IMPRESSION: Intraoperative imaging of C5-7 anterior fixation.   Electronically Signed   By: Jeronimo Greaves M.D.   On: 08/30/2014 09:42    Antibiotics:  Anti-infectives    Start     Dose/Rate Route Frequency Ordered Stop   08/30/14 1530  ceFAZolin (ANCEF) IVPB 1 g/50 mL premix     1 g 100 mL/hr over 30 Minutes Intravenous Every 8 hours 08/30/14 1146 08/31/14 0729   08/30/14 0825  bacitracin 50,000 Units in sodium chloride irrigation 0.9 % 500 mL irrigation  Status:  Discontinued       As needed 08/30/14 0828 08/30/14 0956   08/30/14 0700  ceFAZolin (ANCEF) IVPB 2 g/50 mL premix     2 g 100 mL/hr over 30 Minutes Intravenous To Neuro OR-Station #32 08/29/14 1304 08/30/14 0750      Discharge Exam: Blood pressure 125/75, pulse 68, temperature 98.5 F (36.9 C), temperature source Oral, resp. rate 18, height 5\' 9"  (1.753 m), weight 229 lb 4.8 oz (104.01 kg), SpO2 97 %. Neurologic: Grossly normal Incision CDI  Discharge Medications:     Medication List    STOP taking these medications  diazepam 5 MG tablet  Commonly known as:  VALIUM     naproxen 500 MG tablet  Commonly known as:  NAPROSYN     predniSONE 20 MG tablet  Commonly known as:  DELTASONE      TAKE these medications        diphenhydrAMINE 25 MG tablet  Commonly known as:  BENADRYL  Take 50 mg by mouth every 6 (six) hours as needed for allergies.     methocarbamol 500 MG tablet  Commonly known as:  ROBAXIN  Take 1 tablet (500 mg total) by mouth every 8 (eight) hours as needed for muscle spasms.     metoCLOPramide 10 MG tablet  Commonly known as:  REGLAN  Take 1 tablet (10 mg total) by mouth every 8 (eight) hours as needed for nausea or vomiting.     ondansetron 4 MG tablet  Commonly known as:  ZOFRAN  Take 4 mg by mouth every 8  (eight) hours as needed for nausea or vomiting.     oxyCODONE-acetaminophen 10-325 MG per tablet  Commonly known as:  PERCOCET  Take 1 tablet by mouth every 4 (four) hours as needed for pain.     oxyCODONE-acetaminophen 5-325 MG per tablet  Commonly known as:  PERCOCET  Take 2 tablets by mouth every 4 (four) hours as needed.        Disposition: home   Final Dx: ACDF C5-6, C6-7      Discharge Instructions     Remove dressing in 72 hours    Complete by:  As directed      Call MD for:  difficulty breathing, headache or visual disturbances    Complete by:  As directed      Call MD for:  persistant nausea and vomiting    Complete by:  As directed      Call MD for:  redness, tenderness, or signs of infection (pain, swelling, redness, odor or green/yellow discharge around incision site)    Complete by:  As directed      Call MD for:  severe uncontrolled pain    Complete by:  As directed      Call MD for:  temperature >100.4    Complete by:  As directed      Diet - low sodium heart healthy    Complete by:  As directed      Discharge instructions    Complete by:  As directed   No strenuous activity, no driving, no heavy lifting     Increase activity slowly    Complete by:  As directed            Follow-up Information    Follow up with Brissia Delisa S, MD. Schedule an appointment as soon as possible for a visit in 2 weeks.   Specialty:  Neurosurgery   Contact information:   [address] [place] [address] [phone]        Signed: Tia Alert 08/30/2014, 1:42 PM

## 2014-08-30 NOTE — Anesthesia Procedure Notes (Signed)
Procedure Name: Intubation Date/Time: 08/30/2014 7:38 AM Performed by: Ferol LuzMCMILLEN, Keelynn Furgerson L Pre-anesthesia Checklist: Patient identified, Emergency Drugs available, Suction available, Patient being monitored and Timeout performed Patient Re-evaluated:Patient Re-evaluated prior to inductionOxygen Delivery Method: Circle system utilized Preoxygenation: Pre-oxygenation with 100% oxygen Intubation Type: IV induction Ventilation: Mask ventilation without difficulty Laryngoscope Size: Mac and 4 Grade View: Grade I Tube type: Oral Number of attempts: 1 Airway Equipment and Method: Stylet Placement Confirmation: ETT inserted through vocal cords under direct vision,  breath sounds checked- equal and bilateral and positive ETCO2 Secured at: 22 cm Tube secured with: Tape Dental Injury: Teeth and Oropharynx as per pre-operative assessment

## 2014-08-30 NOTE — Plan of Care (Signed)
Problem: Consults Goal: Diagnosis - Spinal Surgery Outcome: Completed/Met Date Met:  08/30/14 Cervical Spine Fusion

## 2014-08-30 NOTE — Anesthesia Postprocedure Evaluation (Signed)
Anesthesia Post Note  Patient: Eduardo Barnes  Procedure(s) Performed: Procedure(s) (LRB): ANTERIOR CERVICAL DECOMPRESSION/DISCECTOMY FUSION CERVICAL FIVE-SIX,CERVICAL SIX-SEVEN (N/A)  Anesthesia type: General  Patient location: PACU  Post pain: Pain level controlled  Post assessment: Post-op Vital signs reviewed  Last Vitals: BP 125/75 mmHg  Pulse 68  Temp(Src) 36.9 C (Oral)  Resp 18  Ht 5\' 9"  (1.753 m)  Wt 229 lb 4.8 oz (104.01 kg)  BMI 33.85 kg/m2  SpO2 97%  Post vital signs: Reviewed  Level of consciousness: sedated  Complications: No apparent anesthesia complications

## 2014-08-30 NOTE — Op Note (Signed)
08/30/2014  9:57 AM  PATIENT:  Eduardo Barnes  39 y.o. 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 Decompressive anterior cervical discectomy C5-6 C6-7, 2. Anterior cervical arthrodesis C5-C6 C6-7 utilizing peek in her body cages packed with local autograft and morcellized allograft, 3. Anterior cervical plating C5 C7 inclusive utilizing a Nuvasive archon plate  SURGEON:  Marikay Alaravid Calene Paradiso, MD  ASSISTANTS: Dr. Gerlene FeeKritzer  ANESTHESIA:   General  EBL: less than 50 ml  Total I/O In: 1700 [I.V.:1700] Out: -   BLOOD ADMINISTERED:none  DRAINS: none   SPECIMEN:  No Specimen  INDICATION FOR PROCEDURE: this patient presented with severe left arm pain. MRI showed severe foraminal stenosis at C5-6 and C6-7 on the left. He tried an epidural steroid injection which made things worse. He had acute severe unrelenting intense pain. I recommended ACDF with plating at C5-6 and C6-7. Patient understood the risks, benefits, and alternatives and potential outcomes and wished to proceed.  PROCEDURE DETAILS: Patient was brought to the operating room placed under general endotracheal anesthesia. Patient was placed in the supine position on the operating room table. The neck was prepped with Duraprep and draped in a sterile fashion.   Three cc of local anesthesia was injected and a transverse incision was made on the right side of the neck.  Dissection was carried down thru the subcutaneous tissue and the platysma was  elevated, opened, and undermined with Metzenbaum scissors.  Dissection was then carried out thru an avascular plane leaving the sternocleidomastoid carotid artery and jugular vein laterally and the trachea and esophagus medially. The ventral aspect of the vertebral column was identified and a localizing x-ray was taken. The C5-6 level was identified. The longus colli muscles were  then elevated and the retractor was placedto expose C5-6 and C6-7. The annulus was incised at both levels and the disc space entered. Discectomy was performed with micro-curettes and pituitary rongeurs. I then used the high-speed drill to drill the endplates down to the level of the posterior longitudinal ligament. The drill shavings were saved in a mucous trap for later arthrodesis. The operating microscope was draped and brought into the field provided additional magnification, illumination and visualization. Discectomy was continued posteriorly thru the disc space. Posterior longitudinal ligament was opened with a nerve hook, and then removed along with disc herniation and osteophytes, decompressing the spinal canal and thecal sac. We then continued to remove osteophytic overgrowth and disc material decompressing the neural foramina and exiting nerve roots bilaterally. The scope was angled up and down to help decompress and undercut the vertebral bodies. Once the decompression was completed we could pass a nerve hook circumferentially to assure adequate decompression in the midline and in the neural foramina. So by both visualization and palpation we felt we had an adequate decompression of the neural elements. We then measured the height of the intravertebral disc space and selected a 6 mm millimeter Peek interbody cage packed with autograft and morcellized allograft. It was then gently positioned in the intravertebral disc space and countersunk. A 7 mm graft was used at C6-7. I then used a archon plate and placed  variable angle screws into the vertebral bodies and locked them into position. The wound was irrigated with bacitracin solution, checked for hemostasis which was established and confirmed. Once meticulous hemostasis was achieved, we then proceeded with closure. The platysma was closed with interrupted 3-0  undyed Vicryl suture, the subcuticular layer was closed with interrupted 3-0 undyed Vicryl  suture. The skin edges were approximated with steristrips. The drapes were removed. A sterile dressing was applied. The patient was then awakened from general anesthesia and transferred to the recovery room in stable condition. At the end of the procedure all sponge, needle and instrument counts were correct.   PLAN OF CARE: Admit for overnight observation  PATIENT DISPOSITION:  PACU - hemodynamically stable.   Delay start of Pharmacological VTE agent (>24hrs) due to surgical blood loss or risk of bleeding:  yes

## 2014-08-30 NOTE — Progress Notes (Signed)
Patient alert and oriented, mae's well, voiding adequate amount of urine, swallowing without difficulty, no c/o pain. Patient discharged home with family. Script and discharged instructions given to patient. Patient and family stated understanding of d/c instructions given and has an appointment with MD. 

## 2014-08-30 NOTE — Transfer of Care (Signed)
Immediate Anesthesia Transfer of Care Note  Patient: Eduardo Barnes  Procedure(s) Performed: Procedure(s): ANTERIOR CERVICAL DECOMPRESSION/DISCECTOMY FUSION CERVICAL FIVE-SIX,CERVICAL SIX-SEVEN (N/A)  Patient Location: PACU  Anesthesia Type:General  Level of Consciousness: awake, alert , oriented and patient cooperative  Airway & Oxygen Therapy: Patient Spontanous Breathing and Patient connected to nasal cannula oxygen  Post-op Assessment: Report given to RN, Post -op Vital signs reviewed and stable and Patient moving all extremities  Post vital signs: Reviewed and stable  Last Vitals:  Filed Vitals:   08/30/14 0639  BP: 124/64  Pulse: 72  Temp: 36.7 C  Resp: 18    Complications: No apparent anesthesia complications

## 2014-08-31 ENCOUNTER — Encounter (HOSPITAL_COMMUNITY): Payer: Self-pay | Admitting: Neurological Surgery

## 2014-08-31 MED FILL — Oxycodone HCl Tab 5 MG: ORAL | Qty: 2 | Status: AC

## 2014-10-08 ENCOUNTER — Other Ambulatory Visit: Payer: Self-pay | Admitting: Neurological Surgery

## 2014-11-07 ENCOUNTER — Inpatient Hospital Stay (HOSPITAL_COMMUNITY)
Admission: RE | Admit: 2014-11-07 | Discharge: 2014-11-07 | Disposition: A | Discharge status: Home / Self Care | Payer: BLUE CROSS/BLUE SHIELD | Source: Ambulatory Visit

## 2014-11-07 ENCOUNTER — Other Ambulatory Visit (HOSPITAL_COMMUNITY): Payer: Self-pay | Admitting: *Deleted

## 2014-11-07 NOTE — Pre-Procedure Instructions (Addendum)
Eduardo Barnes  11/07/2014      Your procedure is scheduled on Thursday, November 15, 2014    Report to [facility] Entrance "A" Admitting Office at 5:30 AM.   Call this number if you have problems the morning of surgery: [phone]   Any questions prior to day of surgery, please call [phone] between 8 & 4 PM.    Remember:  Do not eat food or drink liquids after midnight Wednesday, 11/14/14.  Take these medicines the morning of surgery with A SIP OF WATER: pain meds as needed, reglan,/zofran if needed    STOP all herbel meds, nsaids (aleve,naproxen,advil,ibuprofen) 5 days prior to surgery starting 11/10/14 including vitamins, aspirin   Do not wear jewelry.  Do not wear lotions, powders, or cologne.  You may wear deodorant.  Men may shave face and neck.  Do not bring valuables to the hospital.  [facility] is not responsible for any belongings or valuables.  Contacts, dentures or bridgework may not be worn into surgery.  Leave your suitcase in the car.  After surgery it may be brought to your room.  For patients admitted to the hospital, discharge time will be determined by your treatment team.  Special instructions:   Special Instructions: [facility] - Preparing for Surgery  Before surgery, you can play an important role.  Because skin is not sterile, your skin needs to be as free of germs as possible.  You can reduce the number of germs on you skin by washing with CHG (chlorahexidine gluconate) soap before surgery.  CHG is an antiseptic cleaner which kills germs and bonds with the skin to continue killing germs even after washing.  Please DO NOT use if you have an allergy to CHG or antibacterial soaps.  If your skin becomes reddened/irritated stop using the CHG and inform your nurse when you arrive at Short Stay.  Do not shave (including legs and underarms) for at least 48 hours prior to the first CHG shower.  You may shave your face.  Please follow these  instructions carefully:   1.  Shower with CHG Soap the night before surgery and the morning of Surgery.  2.  If you choose to wash your hair, wash your hair first as usual with your normal shampoo.  3.  After you shampoo, rinse your hair and body thoroughly to remove the Shampoo.  4.  Use CHG as you would any other liquid soap.  You can apply chg directly  to the skin and wash gently with scrungie or a clean washcloth.  5.  Apply the CHG Soap to your body ONLY FROM THE NECK DOWN.  Do not use on open wounds or open sores.  Avoid contact with your eyes ears, mouth and genitals (private parts).  Wash genitals (private parts)       with your normal soap.  6.  Wash thoroughly, paying special attention to the area where your surgery will be performed.  7.  Thoroughly rinse your body with warm water from the neck down.  8.  DO NOT shower/wash with your normal soap after using and rinsing off the CHG Soap.  9.  Pat yourself dry with a clean towel.            10.  Wear clean pajamas.            11.  Place clean sheets on your bed the night of your first shower and do not sleep with pets.  Day of Surgery  Do not apply any lotions/deodorants the morning of surgery.  Please wear clean clothes to the hospital/surgery center.   Please read over the following fact sheets that you were given. Pain Booklet, Coughing and Deep Breathing, Blood Transfusion Information, MRSA Information and Surgical Site Infection Prevention

## 2014-11-12 ENCOUNTER — Encounter (HOSPITAL_COMMUNITY): Payer: Self-pay

## 2014-11-12 ENCOUNTER — Encounter (HOSPITAL_COMMUNITY)
Admission: RE | Admit: 2014-11-12 | Discharge: 2014-11-12 | Disposition: A | Discharge status: Home / Self Care | Payer: BLUE CROSS/BLUE SHIELD | Source: Ambulatory Visit | Attending: Neurological Surgery | Admitting: Neurological Surgery

## 2014-11-12 ENCOUNTER — Ambulatory Visit (HOSPITAL_COMMUNITY)
Admission: RE | Admit: 2014-11-12 | Discharge: 2014-11-12 | Disposition: A | Discharge status: Home / Self Care | Payer: BLUE CROSS/BLUE SHIELD | Source: Ambulatory Visit | Attending: Neurological Surgery | Admitting: Neurological Surgery

## 2014-11-12 DIAGNOSIS — M4310 Spondylolisthesis, site unspecified: Secondary | ICD-10-CM | POA: Insufficient documentation

## 2014-11-12 DIAGNOSIS — Z981 Arthrodesis status: Secondary | ICD-10-CM

## 2014-11-12 DIAGNOSIS — Z01812 Encounter for preprocedural laboratory examination: Secondary | ICD-10-CM

## 2014-11-12 DIAGNOSIS — Z87891 Personal history of nicotine dependence: Secondary | ICD-10-CM

## 2014-11-12 DIAGNOSIS — M549 Dorsalgia, unspecified: Secondary | ICD-10-CM | POA: Insufficient documentation

## 2014-11-12 DIAGNOSIS — Z01818 Encounter for other preprocedural examination: Secondary | ICD-10-CM | POA: Insufficient documentation

## 2014-11-12 DIAGNOSIS — G8929 Other chronic pain: Secondary | ICD-10-CM

## 2014-11-12 DIAGNOSIS — Z0183 Encounter for blood typing: Secondary | ICD-10-CM | POA: Insufficient documentation

## 2014-11-12 LAB — CBC WITH DIFFERENTIAL/PLATELET
BASOS PCT: 0 % (ref 0–1)
Basophils Absolute: 0 10*3/uL (ref 0.0–0.1)
Eosinophils Absolute: 0.2 10*3/uL (ref 0.0–0.7)
Eosinophils Relative: 2 % (ref 0–5)
HCT: 42.6 % (ref 39.0–52.0)
Hemoglobin: 14.7 g/dL (ref 13.0–17.0)
LYMPHS ABS: 2.2 10*3/uL (ref 0.7–4.0)
Lymphocytes Relative: 23 % (ref 12–46)
MCH: 30.6 pg (ref 26.0–34.0)
MCHC: 34.5 g/dL (ref 30.0–36.0)
MCV: 88.6 fL (ref 78.0–100.0)
Monocytes Absolute: 0.6 10*3/uL (ref 0.1–1.0)
Monocytes Relative: 6 % (ref 3–12)
NEUTROS PCT: 69 % (ref 43–77)
Neutro Abs: 6.4 10*3/uL (ref 1.7–7.7)
Platelets: 287 10*3/uL (ref 150–400)
RBC: 4.81 MIL/uL (ref 4.22–5.81)
RDW: 14 % (ref 11.5–15.5)
WBC: 9.4 10*3/uL (ref 4.0–10.5)

## 2014-11-12 LAB — PROTIME-INR
INR: 1.11 (ref 0.00–1.49)
Prothrombin Time: 14.5 seconds (ref 11.6–15.2)

## 2014-11-12 LAB — TYPE AND SCREEN
ABO/RH(D): A POS
ANTIBODY SCREEN: NEGATIVE

## 2014-11-12 LAB — BASIC METABOLIC PANEL
Anion gap: 6 (ref 5–15)
BUN: 10 mg/dL (ref 6–20)
CO2: 25 mmol/L (ref 22–32)
Calcium: 9.3 mg/dL (ref 8.9–10.3)
Chloride: 107 mmol/L (ref 101–111)
Creatinine, Ser: 0.93 mg/dL (ref 0.61–1.24)
GFR calc non Af Amer: >60 mL/min (ref >60)
GLUCOSE: 94 mg/dL (ref 65–99)
POTASSIUM: 4 mmol/L (ref 3.5–5.1)
Sodium: 138 mmol/L (ref 135–145)

## 2014-11-12 LAB — SURGICAL PCR SCREEN
MRSA, PCR: NEGATIVE
Staphylococcus aureus: NEGATIVE

## 2014-11-14 MED ORDER — CEFAZOLIN SODIUM-DEXTROSE 2-3 GM-% IV SOLR
2.0000 g | INTRAVENOUS | Status: AC
  Administered 2014-11-15: 2 g via INTRAVENOUS

## 2014-11-14 MED ORDER — DEXAMETHASONE SODIUM PHOSPHATE 10 MG/ML IJ SOLN
10.0000 mg | INTRAMUSCULAR | Status: AC
Start: 2014-11-15 — End: 2014-11-15
  Administered 2014-11-15: 10 mg via INTRAVENOUS
  Filled 2014-11-14: qty 1

## 2014-11-15 ENCOUNTER — Encounter (HOSPITAL_COMMUNITY): Payer: Self-pay | Admitting: *Deleted

## 2014-11-15 ENCOUNTER — Inpatient Hospital Stay (HOSPITAL_COMMUNITY): Payer: BLUE CROSS/BLUE SHIELD | Admitting: Certified Registered Nurse Anesthetist

## 2014-11-15 ENCOUNTER — Inpatient Hospital Stay (HOSPITAL_COMMUNITY): Payer: BLUE CROSS/BLUE SHIELD

## 2014-11-15 ENCOUNTER — Inpatient Hospital Stay (HOSPITAL_COMMUNITY)
Admission: RE | Admit: 2014-11-15 | Discharge: 2014-11-16 | DRG: 460 | Disposition: A | Discharge status: Home / Self Care | Payer: BLUE CROSS/BLUE SHIELD | Source: Ambulatory Visit | Attending: Neurological Surgery | Admitting: Neurological Surgery

## 2014-11-15 ENCOUNTER — Encounter (HOSPITAL_COMMUNITY)
Admission: RE | Disposition: A | Discharge status: Home / Self Care | Payer: Self-pay | Source: Ambulatory Visit | Attending: Neurological Surgery

## 2014-11-15 DIAGNOSIS — Z981 Arthrodesis status: Secondary | ICD-10-CM

## 2014-11-15 DIAGNOSIS — M4317 Spondylolisthesis, lumbosacral region: Secondary | ICD-10-CM | POA: Diagnosis present

## 2014-11-15 DIAGNOSIS — Z6833 Body mass index (BMI) 33.0-33.9, adult: Secondary | ICD-10-CM

## 2014-11-15 DIAGNOSIS — F17210 Nicotine dependence, cigarettes, uncomplicated: Secondary | ICD-10-CM | POA: Diagnosis present

## 2014-11-15 DIAGNOSIS — Z419 Encounter for procedure for purposes other than remedying health state, unspecified: Secondary | ICD-10-CM

## 2014-11-15 DIAGNOSIS — M5136 Other intervertebral disc degeneration, lumbar region: Secondary | ICD-10-CM | POA: Diagnosis present

## 2014-11-15 DIAGNOSIS — M545 Low back pain: Secondary | ICD-10-CM | POA: Diagnosis present

## 2014-11-15 DIAGNOSIS — Z8249 Family history of ischemic heart disease and other diseases of the circulatory system: Secondary | ICD-10-CM

## 2014-11-15 HISTORY — DX: Low back pain: M54.5

## 2014-11-15 HISTORY — PX: POSTERIOR LUMBAR FUSION: SHX6036

## 2014-11-15 HISTORY — DX: Low back pain, unspecified: M54.50

## 2014-11-15 HISTORY — DX: Unspecified osteoarthritis, unspecified site: M19.90

## 2014-11-15 HISTORY — DX: Other chronic pain: G89.29

## 2014-11-15 SURGERY — POSTERIOR LUMBAR FUSION 1 LEVEL
Anesthesia: General | Site: Spine Lumbar

## 2014-11-15 MED ORDER — ONDANSETRON HCL 4 MG/2ML IJ SOLN: 4.0000 mg | INTRAMUSCULAR | Status: DC | PRN

## 2014-11-15 MED ORDER — PROPOFOL 10 MG/ML IV BOLUS
INTRAVENOUS | Status: AC
  Filled 2014-11-15: qty 20

## 2014-11-15 MED ORDER — MIDAZOLAM HCL 2 MG/2ML IJ SOLN
INTRAMUSCULAR | Status: AC
  Filled 2014-11-15: qty 2

## 2014-11-15 MED ORDER — FENTANYL CITRATE (PF) 100 MCG/2ML IJ SOLN
INTRAMUSCULAR | Status: DC | PRN
  Administered 2014-11-15: 100 ug via INTRAVENOUS
  Administered 2014-11-15 (×4): 50 ug via INTRAVENOUS
  Administered 2014-11-15: 75 ug via INTRAVENOUS
  Administered 2014-11-15 (×2): 50 ug via INTRAVENOUS
  Administered 2014-11-15: 100 ug via INTRAVENOUS
  Administered 2014-11-15: 75 ug via INTRAVENOUS

## 2014-11-15 MED ORDER — ROCURONIUM BROMIDE 50 MG/5ML IV SOLN
INTRAVENOUS | Status: AC
  Filled 2014-11-15: qty 1

## 2014-11-15 MED ORDER — GLYCOPYRROLATE 0.2 MG/ML IJ SOLN
INTRAMUSCULAR | Status: AC
  Filled 2014-11-15: qty 4

## 2014-11-15 MED ORDER — LIDOCAINE HCL (CARDIAC) 20 MG/ML IV SOLN
INTRAVENOUS | Status: DC | PRN
  Administered 2014-11-15: 100 mg via INTRAVENOUS

## 2014-11-15 MED ORDER — SUCCINYLCHOLINE CHLORIDE 20 MG/ML IJ SOLN
INTRAMUSCULAR | Status: AC
  Filled 2014-11-15: qty 1

## 2014-11-15 MED ORDER — PNEUMOCOCCAL VAC POLYVALENT 25 MCG/0.5ML IJ INJ
0.5000 mL | INJECTION | INTRAMUSCULAR | Status: AC
  Administered 2014-11-16: 0.5 mL via INTRAMUSCULAR
  Filled 2014-11-15: qty 0.5

## 2014-11-15 MED ORDER — SODIUM CHLORIDE 0.9 % IV SOLN
250.0000 mL | INTRAVENOUS | Status: DC
  Administered 2014-11-15: 250 mL via INTRAVENOUS

## 2014-11-15 MED ORDER — OXYCODONE HCL 5 MG PO TABS
ORAL_TABLET | ORAL | Status: AC
  Administered 2014-11-15: 5 mg
  Filled 2014-11-15: qty 1

## 2014-11-15 MED ORDER — 0.9 % SODIUM CHLORIDE (POUR BTL) OPTIME
TOPICAL | Status: DC | PRN
  Administered 2014-11-15: 1000 mL

## 2014-11-15 MED ORDER — POTASSIUM CHLORIDE IN NACL 20-0.9 MEQ/L-% IV SOLN: INTRAVENOUS | Status: DC

## 2014-11-15 MED ORDER — SODIUM CHLORIDE 0.9 % IR SOLN
Status: DC | PRN
  Administered 2014-11-15: 500 mL

## 2014-11-15 MED ORDER — ACETAMINOPHEN 650 MG RE SUPP: 650.0000 mg | RECTAL | Status: DC | PRN

## 2014-11-15 MED ORDER — PHENYLEPHRINE HCL 10 MG/ML IJ SOLN
INTRAMUSCULAR | Status: DC | PRN
  Administered 2014-11-15: 80 ug via INTRAVENOUS

## 2014-11-15 MED ORDER — HYDROMORPHONE HCL 1 MG/ML IJ SOLN
0.5000 mg | INTRAMUSCULAR | Status: AC | PRN
  Administered 2014-11-15 (×4): 0.5 mg via INTRAVENOUS

## 2014-11-15 MED ORDER — GLYCOPYRROLATE 0.2 MG/ML IJ SOLN
INTRAMUSCULAR | Status: DC | PRN
  Administered 2014-11-15: .8 mg via INTRAVENOUS

## 2014-11-15 MED ORDER — ONDANSETRON HCL 4 MG/2ML IJ SOLN: 4.0000 mg | Freq: Once | INTRAMUSCULAR | Status: DC | PRN

## 2014-11-15 MED ORDER — NICOTINE 14 MG/24HR TD PT24
14.0000 mg | MEDICATED_PATCH | Freq: Every day | TRANSDERMAL | Status: DC
  Administered 2014-11-15 – 2014-11-16 (×2): 14 mg via TRANSDERMAL
  Filled 2014-11-15 (×2): qty 1

## 2014-11-15 MED ORDER — SENNA 8.6 MG PO TABS
1.0000 | ORAL_TABLET | Freq: Two times a day (BID) | ORAL | Status: DC
  Administered 2014-11-15 – 2014-11-16 (×2): 8.6 mg via ORAL
  Filled 2014-11-15 (×2): qty 1

## 2014-11-15 MED ORDER — ZOLPIDEM TARTRATE 5 MG PO TABS
5.0000 mg | ORAL_TABLET | Freq: Every evening | ORAL | Status: DC | PRN
  Administered 2014-11-16: 5 mg via ORAL
  Filled 2014-11-15: qty 1

## 2014-11-15 MED ORDER — HYDROMORPHONE HCL 1 MG/ML IJ SOLN
INTRAMUSCULAR | Status: AC
  Filled 2014-11-15: qty 1

## 2014-11-15 MED ORDER — MENTHOL 3 MG MT LOZG: 1.0000 | LOZENGE | OROMUCOSAL | Status: DC | PRN

## 2014-11-15 MED ORDER — ONDANSETRON HCL 4 MG/2ML IJ SOLN
INTRAMUSCULAR | Status: AC
  Filled 2014-11-15: qty 2

## 2014-11-15 MED ORDER — BUPIVACAINE HCL (PF) 0.25 % IJ SOLN
INTRAMUSCULAR | Status: DC | PRN
  Administered 2014-11-15: 5 mL

## 2014-11-15 MED ORDER — FENTANYL CITRATE (PF) 250 MCG/5ML IJ SOLN
INTRAMUSCULAR | Status: AC
  Filled 2014-11-15: qty 5

## 2014-11-15 MED ORDER — OXYCODONE-ACETAMINOPHEN 5-325 MG PO TABS
2.0000 | ORAL_TABLET | ORAL | Status: DC | PRN
  Administered 2014-11-15 – 2014-11-16 (×5): 2 via ORAL
  Filled 2014-11-15 (×5): qty 2

## 2014-11-15 MED ORDER — PROPOFOL 10 MG/ML IV BOLUS
INTRAVENOUS | Status: DC | PRN
  Administered 2014-11-15: 200 mg via INTRAVENOUS

## 2014-11-15 MED ORDER — HYDROMORPHONE HCL 1 MG/ML IJ SOLN
0.5000 mg | INTRAMUSCULAR | Status: DC | PRN
  Administered 2014-11-15 (×2): 0.5 mg via INTRAVENOUS

## 2014-11-15 MED ORDER — PHENOL 1.4 % MT LIQD: 1.0000 | OROMUCOSAL | Status: DC | PRN

## 2014-11-15 MED ORDER — ONDANSETRON HCL 4 MG/2ML IJ SOLN
INTRAMUSCULAR | Status: DC | PRN
  Administered 2014-11-15: 4 mg via INTRAVENOUS

## 2014-11-15 MED ORDER — TIZANIDINE HCL 4 MG PO TABS
4.0000 mg | ORAL_TABLET | Freq: Three times a day (TID) | ORAL | Status: DC | PRN
  Administered 2014-11-15 – 2014-11-16 (×2): 4 mg via ORAL
  Filled 2014-11-15 (×3): qty 1

## 2014-11-15 MED ORDER — PHENYLEPHRINE 40 MCG/ML (10ML) SYRINGE FOR IV PUSH (FOR BLOOD PRESSURE SUPPORT)
PREFILLED_SYRINGE | INTRAVENOUS | Status: AC
  Filled 2014-11-15: qty 10

## 2014-11-15 MED ORDER — THROMBIN 5000 UNITS EX SOLR
CUTANEOUS | Status: DC | PRN
  Administered 2014-11-15: 5000 [IU] via TOPICAL

## 2014-11-15 MED ORDER — ROCURONIUM BROMIDE 100 MG/10ML IV SOLN
INTRAVENOUS | Status: DC | PRN
  Administered 2014-11-15: 50 mg via INTRAVENOUS
  Administered 2014-11-15: 20 mg via INTRAVENOUS
  Administered 2014-11-15: 10 mg via INTRAVENOUS
  Administered 2014-11-15: 20 mg via INTRAVENOUS

## 2014-11-15 MED ORDER — SODIUM CHLORIDE 0.9 % IJ SOLN: 3.0000 mL | INTRAMUSCULAR | Status: DC | PRN

## 2014-11-15 MED ORDER — ARTIFICIAL TEARS OP OINT
TOPICAL_OINTMENT | OPHTHALMIC | Status: DC | PRN
  Administered 2014-11-15: 1 via OPHTHALMIC

## 2014-11-15 MED ORDER — NEOSTIGMINE METHYLSULFATE 10 MG/10ML IV SOLN
INTRAVENOUS | Status: DC | PRN
  Administered 2014-11-15: 5 mg via INTRAVENOUS

## 2014-11-15 MED ORDER — THROMBIN 20000 UNITS EX SOLR
CUTANEOUS | Status: DC | PRN
  Administered 2014-11-15: 20 mL via TOPICAL

## 2014-11-15 MED ORDER — LIDOCAINE HCL (CARDIAC) 20 MG/ML IV SOLN
INTRAVENOUS | Status: AC
  Filled 2014-11-15: qty 5

## 2014-11-15 MED ORDER — MORPHINE SULFATE 2 MG/ML IJ SOLN
1.0000 mg | INTRAMUSCULAR | Status: DC | PRN
  Administered 2014-11-15 – 2014-11-16 (×3): 2 mg via INTRAVENOUS
  Administered 2014-11-16: 1 mg via INTRAVENOUS
  Filled 2014-11-15 (×4): qty 1

## 2014-11-15 MED ORDER — CEFAZOLIN SODIUM 1-5 GM-% IV SOLN
1.0000 g | Freq: Three times a day (TID) | INTRAVENOUS | Status: AC
  Administered 2014-11-15 – 2014-11-16 (×2): 1 g via INTRAVENOUS
  Filled 2014-11-15 (×2): qty 50

## 2014-11-15 MED ORDER — LIDOCAINE HCL 4 % MT SOLN
OROMUCOSAL | Status: DC | PRN
  Administered 2014-11-15: 5 mL via TOPICAL

## 2014-11-15 MED ORDER — SODIUM CHLORIDE 0.9 % IJ SOLN: 3.0000 mL | Freq: Two times a day (BID) | INTRAMUSCULAR | Status: DC

## 2014-11-15 MED ORDER — LACTATED RINGERS IV SOLN
INTRAVENOUS | Status: DC | PRN
  Administered 2014-11-15 (×3): via INTRAVENOUS

## 2014-11-15 MED ORDER — MIDAZOLAM HCL 2 MG/2ML IJ SOLN
0.5000 mg | INTRAMUSCULAR | Status: AC | PRN
  Administered 2014-11-15 (×2): 0.5 mg via INTRAVENOUS

## 2014-11-15 MED ORDER — CELECOXIB 200 MG PO CAPS
200.0000 mg | ORAL_CAPSULE | Freq: Two times a day (BID) | ORAL | Status: DC
  Administered 2014-11-15 – 2014-11-16 (×3): 200 mg via ORAL
  Filled 2014-11-15 (×3): qty 1

## 2014-11-15 MED ORDER — ACETAMINOPHEN 325 MG PO TABS: 650.0000 mg | ORAL_TABLET | ORAL | Status: DC | PRN

## 2014-11-15 MED ORDER — MIDAZOLAM HCL 5 MG/5ML IJ SOLN
INTRAMUSCULAR | Status: DC | PRN
  Administered 2014-11-15: 2 mg via INTRAVENOUS

## 2014-11-15 MED ORDER — THROMBIN 5000 UNITS EX SOLR
OROMUCOSAL | Status: DC | PRN
  Administered 2014-11-15: 5 mL via TOPICAL

## 2014-11-15 MED ORDER — OXYCODONE HCL 5 MG PO TABS: 5.0000 mg | ORAL_TABLET | Freq: Once | ORAL | Status: DC

## 2014-11-15 SURGICAL SUPPLY — 64 items
BAG DECANTER FOR FLEXI CONT (MISCELLANEOUS) ×2 IMPLANT
BENZOIN TINCTURE PRP APPL 2/3 (GAUZE/BANDAGES/DRESSINGS) ×2 IMPLANT
BLADE CLIPPER SURG (BLADE) IMPLANT
BONE MATRIX OSTEOCEL PRO MED (Bone Implant) ×4 IMPLANT
BUR MATCHSTICK NEURO 3.0 LAGG (BURR) ×2 IMPLANT
CAGE PLIF 8X9X23-12 LUMBAR (Cage) ×4 IMPLANT
CANISTER SUCT 3000ML PPV (MISCELLANEOUS) ×2 IMPLANT
CONT SPEC 4OZ CLIKSEAL STRL BL (MISCELLANEOUS) ×2 IMPLANT
COVER BACK TABLE 60X90IN (DRAPES) ×2 IMPLANT
DRAPE C-ARM 42X72 X-RAY (DRAPES) ×2 IMPLANT
DRAPE LAPAROTOMY 100X72X124 (DRAPES) ×2 IMPLANT
DRAPE POUCH INSTRU U-SHP 10X18 (DRAPES) ×2 IMPLANT
DRAPE SURG 17X23 STRL (DRAPES) ×2 IMPLANT
DRSG OPSITE 4X5.5 SM (GAUZE/BANDAGES/DRESSINGS) ×2 IMPLANT
DRSG OPSITE POSTOP 4X6 (GAUZE/BANDAGES/DRESSINGS) ×2 IMPLANT
DRSG TELFA 3X8 NADH (GAUZE/BANDAGES/DRESSINGS) IMPLANT
DURAPREP 26ML APPLICATOR (WOUND CARE) ×2 IMPLANT
ELECT REM PT RETURN 9FT ADLT (ELECTROSURGICAL) ×2
ELECTRODE REM PT RTRN 9FT ADLT (ELECTROSURGICAL) ×1 IMPLANT
EVACUATOR 1/8 PVC DRAIN (DRAIN) ×2 IMPLANT
GAUZE SPONGE 4X4 16PLY XRAY LF (GAUZE/BANDAGES/DRESSINGS) IMPLANT
GLOVE BIO SURGEON STRL SZ8 (GLOVE) ×2 IMPLANT
GLOVE BIOGEL PI IND STRL 7.5 (GLOVE) ×1 IMPLANT
GLOVE BIOGEL PI INDICATOR 7.5 (GLOVE) ×1
GLOVE SURG SS PI 7.0 STRL IVOR (GLOVE) ×4 IMPLANT
GOWN STRL REUS W/ TWL LRG LVL3 (GOWN DISPOSABLE) IMPLANT
GOWN STRL REUS W/ TWL XL LVL3 (GOWN DISPOSABLE) ×2 IMPLANT
GOWN STRL REUS W/TWL 2XL LVL3 (GOWN DISPOSABLE) IMPLANT
GOWN STRL REUS W/TWL LRG LVL3 (GOWN DISPOSABLE)
GOWN STRL REUS W/TWL XL LVL3 (GOWN DISPOSABLE) ×2
HEMOSTAT POWDER KIT SURGIFOAM (HEMOSTASIS) ×2 IMPLANT
KIT BASIN OR (CUSTOM PROCEDURE TRAY) ×2 IMPLANT
KIT ROOM TURNOVER OR (KITS) ×2 IMPLANT
MAS PLIF FIXATION TAP 5.0MM ×2 IMPLANT
NEEDLE ASP BONE MRW 8GX15 (NEEDLE) ×2 IMPLANT
NEEDLE HYPO 25X1 1.5 SAFETY (NEEDLE) ×2 IMPLANT
NS IRRIG 1000ML POUR BTL (IV SOLUTION) ×2 IMPLANT
PACK LAMINECTOMY NEURO (CUSTOM PROCEDURE TRAY) ×2 IMPLANT
PAD ARMBOARD 7.5X6 YLW CONV (MISCELLANEOUS) ×10 IMPLANT
ROD 55MM (Rod) ×2 IMPLANT
ROD SPNL 55XPREBNT NS MAS (Rod) ×2 IMPLANT
SCREW LOCK (Screw) ×6 IMPLANT
SCREW LOCK FXNS SPNE MAS PL (Screw) ×6 IMPLANT
SCREW SHANK 5.0X35 (Screw) ×8 IMPLANT
SCREW SHANK 6.5X40 (Screw) ×2 IMPLANT
SCREW SHANK 6.5X40 NS LF (Screw) ×2 IMPLANT
SCREW TULIP 5.5 (Screw) ×12 IMPLANT
SPONGE LAP 4X18 X RAY DECT (DISPOSABLE) IMPLANT
SPONGE SURGIFOAM ABS GEL 100 (HEMOSTASIS) ×2 IMPLANT
STRIP BIOACTIVE VITOSS 25X100X (Neuro Prosthesis/Implant) ×2 IMPLANT
STRIP CLOSURE SKIN 1/2X4 (GAUZE/BANDAGES/DRESSINGS) ×4 IMPLANT
SUT VIC AB 0 CT1 18XCR BRD8 (SUTURE) ×2 IMPLANT
SUT VIC AB 0 CT1 8-18 (SUTURE) ×2
SUT VIC AB 2-0 CP2 18 (SUTURE) ×2 IMPLANT
SUT VIC AB 3-0 SH 8-18 (SUTURE) ×4 IMPLANT
SYR 20ML ECCENTRIC (SYRINGE) ×2 IMPLANT
TAP PLIF MAS FIXATION 5.0MM (TAP) ×2 IMPLANT
TAPE STRIPS DRAPE STRL (GAUZE/BANDAGES/DRESSINGS) ×2 IMPLANT
TOWEL OR 17X24 6PK STRL BLUE (TOWEL DISPOSABLE) ×2 IMPLANT
TOWEL OR 17X26 10 PK STRL BLUE (TOWEL DISPOSABLE) ×2 IMPLANT
TRAP SPECIMEN MUCOUS 40CC (MISCELLANEOUS) ×2 IMPLANT
TRAY FOLEY W/METER SILVER 14FR (SET/KITS/TRAYS/PACK) IMPLANT
TRAY FOLEY W/METER SILVER 16FR (SET/KITS/TRAYS/PACK) ×2 IMPLANT
WATER STERILE IRR 1000ML POUR (IV SOLUTION) ×2 IMPLANT

## 2014-11-15 NOTE — H&P (Signed)
Subjective: Patient is a 39 y.o. male admitted for PLIF. Onset of symptoms was many monthsago, worse since that time.  The pain is rated severe, and is located at the low back and radiates to legs. The pain is described as aching and occurs all day. The symptoms have been progressive. Symptoms are exacerbated by acivity. MRI or CT showed lytic spondylolisthesis L5-S1.   Past Medical History  Diagnosis Date  . Chronic back pain   . DDD (degenerative disc disease), lumbar     Past Surgical History  Procedure Laterality Date  . Tonsillectomy    . Anterior cervical decomp/discectomy fusion N/A 08/30/2014    Procedure: ANTERIOR CERVICAL DECOMPRESSION/DISCECTOMY FUSION CERVICAL FIVE-SIX,CERVICAL SIX-SEVEN;  Surgeon: Tia Alertavid S Koi Yarbro, MD;  Location: [facility];  Service: Neurosurgery;  Laterality: N/A;    Prior to Admission medications   Medication Sig Start Date End Date Taking? Authorizing Provider  oxyCODONE-acetaminophen (PERCOCET) 10-325 MG per tablet Take 1 tablet by mouth every 4 (four) hours as needed for pain.   Yes Historical Provider, MD  tiZANidine (ZANAFLEX) 4 MG capsule Take 4 mg by mouth 3 (three) times daily as needed for muscle spasms.  10/22/14  Yes Historical Provider, MD   No Known Allergies  History  Substance Use Topics  . Smoking status: Current Every Day Smoker -- 0.50 packs/day for 20 years    Types: Cigarettes  . Smokeless tobacco: Not on file  . Alcohol Use: No    Family History  Problem Relation Age of Onset  . Heart disease Mother   . Heart attack Father      Review of Systems  Positive ROS: neg  All other systems have been reviewed and were otherwise negative with the exception of those mentioned in the HPI and as above.  Objective: Vital signs in last 24 hours: Temp:  [97.9 F (36.6 C)] 97.9 F (36.6 C) (07/14 0612) Pulse Rate:  [78] 78 (07/14 0612) Resp:  [20] 20 (07/14 0612) BP: (113)/(63) 113/63 mmHg (07/14 0612) SpO2:  [97 %] 97 % (07/14  0612) Weight:  [224 lb 3 oz (101.691 kg)] 224 lb 3 oz (101.691 kg) (07/14 0612)  General Appearance: Alert, cooperative, no distress, appears stated age Head: Normocephalic, without obvious abnormality, atraumatic Eyes: PERRL, conjunctiva/corneas clear, EOM's intact    Neck: Supple, symmetrical, trachea midline Back: Symmetric, no curvature, ROM normal, no CVA tenderness Lungs:  respirations unlabored Heart: Regular rate and rhythm Abdomen: Soft, non-tender Extremities: Extremities normal, atraumatic, no cyanosis or edema Pulses: 2+ and symmetric all extremities Skin: Skin color, texture, turgor normal, no rashes or lesions  NEUROLOGIC:   Mental status: Alert and oriented x4,  no aphasia, good attention span, fund of knowledge, and memory Motor Exam - grossly normal Sensory Exam - grossly normal Reflexes: 1+ Coordination - grossly normal Gait - grossly normal Balance - grossly normal Cranial Nerves: I: smell Not tested  II: visual acuity  OS: nl    OD: nl  II: visual fields Full to confrontation  II: pupils Equal, round, reactive to light  III,VII: ptosis None  III,IV,VI: extraocular muscles  Full ROM  V: mastication Normal  V: facial light touch sensation  Normal  V,VII: corneal reflex  Present  VII: facial muscle function - upper  Normal  VII: facial muscle function - lower Normal  VIII: hearing Not tested  IX: soft palate elevation  Normal  IX,X: gag reflex Present  XI: trapezius strength  5/5  XI: sternocleidomastoid strength 5/5  XI:  neck flexion strength  5/5  XII: tongue strength  Normal    Data Review Lab Results  Component Value Date   WBC 9.4 11/12/2014   HGB 14.7 11/12/2014   HCT 42.6 11/12/2014   MCV 88.6 11/12/2014   PLT 287 11/12/2014   Lab Results  Component Value Date   NA 138 11/12/2014   K 4.0 11/12/2014   CL 107 11/12/2014   CO2 25 11/12/2014   BUN 10 11/12/2014   CREATININE 0.93 11/12/2014   GLUCOSE 94 11/12/2014   Lab Results   Component Value Date   INR 1.11 11/12/2014    Assessment/Plan: Patient admitted for PLIF. Patient has failed a reasonable attempt at conservative therapy.  I explained the condition and procedure to the patient and answered any questions.  Patient wishes to proceed with procedure as planned. Understands risks/ benefits and typical outcomes of procedure.   Vermon Grays S 11/15/2014 6:22 AM

## 2014-11-15 NOTE — Op Note (Signed)
11/15/2014  11:30 AM  PATIENT:  Eduardo Barnes  39 y.o. male  PRE-OPERATIVE DIAGNOSIS:  Lytic spondylolisthesis L5-S1, foraminal stenosis, degenerative disc disease L4-5, back and leg pain  POST-OPERATIVE DIAGNOSIS:  Same  PROCEDURE:   1. Decompressive lumbar laminectomy L5-S1, Gill type,  requiring more work than would be required for a simple exposure of the disk for PLIF in order to adequately decompress the neural elements and address the spinal stenosis 2. Posterior lumbar interbody fusion L5-S1 using PEEK interbody cages packed with morcellized allograft and autograft 3. Posterior fixation L4-S1 using cortical pedicle screws.  4. Intertransverse arthrodesis L4-S1 using morcellized autograft and allograft. 5. Bone marrow aspirate obtained through separate fascial incision from the right iliac crest  SURGEON:  Marikay Alar, MD  ASSISTANTS: Dr. Mikal Plane  ANESTHESIA:  General  EBL: 400 ml  Total I/O In: 2100 [I.V.:2100] Out: 570 [Urine:170; Blood:400]  BLOOD ADMINISTERED:none  DRAINS: Hemovac   INDICATION FOR PROCEDURE: This patient presented with severe back and leg pain. MRI and plain film showed a lytic pars defect at L5 with grade two spondylolisthesis at L5-S1. L4-5 had degenerative disc disease. He tried medical management without relief. His pain was debilitating. I recommended a decompression instrument fusion at L5-S1. At operation his L5 pedicles were dysmorphic and I not trust the screw purchase inserts dysmorphic pedicles. Also upon the decompression I could see inside the inferior half of the L4-5 joint. I felt this would be destabilizing and the L5 pedicle screws with set against the joint which may cause pain and degeneration. Therefore I decided to take the fusion up to L4, which is not uncommon in situations like this with pars defects at L5 and a grade 2 slip at L5-S1. This was discussed with the patient prior to surgery at length. Patient understood the risks,  benefits, and alternatives and potential outcomes and wished to proceed.  PROCEDURE DETAILS:  The patient was brought to the operating room. After induction of generalized endotracheal anesthesia the patient was rolled into the prone position on chest rolls and all pressure points were padded. The patient's lumbar region was cleaned and then prepped with DuraPrep and draped in the usual sterile fashion. Anesthesia was injected and then a dorsal midline incision was made and carried down to the lumbosacral fascia. The fascia was opened and the paraspinous musculature was taken down in a subperiosteal fashion to expose L4-S1. A self-retaining retractor was placed. Intraoperative fluoroscopy confirmed my level.   I then turned my attention to the decompression and the spinous process was removed and complete lumbar laminectomies, hemi- facetectomies, and foraminotomies were performed at L5-S1. A Gill-type decompression was performed because of his spondylolysis. The patient had significant spinal stenosis and this required more work than would be required for a simple exposure of the disc for posterior lumbar interbody fusion. Much more generous decompression was undertaken in order to adequately decompress the neural elements and address the patient's leg pain. The yellow ligament was removed to expose the underlying dura and nerve roots, and generous foraminotomies were performed to adequately decompress the neural elements. Both the exiting and traversing nerve roots were decompressed on both sides until a coronary dilator passed easily along the nerve roots. Once the decompression was complete, I turned my attention to the posterior lower lumbar interbody fusion. The epidural venous vasculature was coagulated and cut sharply. Disc space was incised and the initial discectomy was performed with pituitary rongeurs. The disc space was distracted with sequential distractors to a  height of 8 mm. We then used a series  of scrapers and shavers to prepare the endplates for fusion. The midline was prepared with Epstein curettes. Once the complete discectomy was finished, we packed an appropriate sized peek interbody cage with local autograft and morcellized allograft, gently retracted the nerve root, and tapped the cage into position at L5-S1.  The midline between the cages was packed with morselized autograft and allograft. The L5 pedicles were quite dysmorphic. I could see inside the L4-5 facet joint and it seemed to be somewhat loose. Starting point for the L5 pedicle screws would be inside of this joint or just distal to the joint and I felt this would cause pain upon standing. I felt it was best to carry the fusion up to L4 to get good screw purchase and reduce the chance of construct failure.  I started with placement of the L4 cortical pedicle screws. The pedicle screw entry zones were identified utilizing surface landmarks and  AP and lateral fluoroscopy. I scored the cortex with the high-speed drill and then used the hand drill and EMG monitoring to drill an upward and outward direction into the pedicle. I then tapped line to line, and the tap was also monitored. I then placed a 5-0 x 35 mm cortical pedicle screw into the pedicles of L4 bilaterally.We then turned our attention to the placement of the lower pedicle screws. The pedicle screw entry zones were identified utilizing surface landmarks and fluoroscopy. I drilled into each pedicle utilizing the hand drill and EMG monitoring, and tapped each pedicle with the appropriate tap. We palpated with a ball probe to assure no break in the cortex. We then placed 5-0 x 35 mm cortical pedicle screws into the pedicles bilaterally at L5, and 6-5 x 40 mm pedicle screws into the sacrum.. We then decorticated the transverse processes and laid a mixture of morcellized autograft and allograft out over these to perform intertransverse arthrodesis at L4-S1. We then placed lordotic rods  into the multiaxial screw heads of the pedicle screws and locked these in position with the locking caps and anti-torque device. We then checked our construct with AP and lateral fluoroscopy. Irrigated with copious amounts of bacitracin-containing saline solution. Placed a medium Hemovac drain through separate stab incision. Inspected the nerve roots once again to assure adequate decompression, lined to the dura with Gelfoam, and closed the muscle and the fascia with 0 Vicryl. Closed the subcutaneous tissues with 2-0 Vicryl and subcuticular tissues with 3-0 Vicryl. The skin was closed with benzoin and Steri-Strips. Dressing was then applied, the patient was awakened from general anesthesia and transported to the recovery room in stable condition. At the end of the procedure all sponge, needle and instrument counts were correct.   PLAN OF CARE: Admit to inpatient   PATIENT DISPOSITION:  PACU - hemodynamically stable.   Delay start of Pharmacological VTE agent (>24hrs) due to surgical blood loss or risk of bleeding:  yes

## 2014-11-15 NOTE — Evaluation (Signed)
Physical Therapy Evaluation Patient Details Name: Eduardo Barnes MRN: [id number] DOB: Jan 02, 1976 Today's Date: 11/15/2014   History of Present Illness  39 y.o. male s/p POSTERIOR LUMBAR INTERBODY FUSION LUMBAR FIVE-SACRAL ONE  Clinical Impression  Patient is s/p above surgery, presenting with functional limitations due to the deficits listed below (see PT Problem List). Patient initially standing without back brace when PT entered room, demonstrating decreased safety awareness. Educated on precautions, use of brace, and safety with mobility. Tolerated bed mobility, transfers, and gait training up to 300 feet today, well. Needs intermittent cues for precautions. Anticipate he will progress quickly towards PT goals. Complaining of new onset of RLE numbness in posterior calf and thigh at end of therapy session. RN notified to monitor. Patient will benefit from skilled PT to increase their independence and safety with mobility to allow discharge to the venue listed below.       Follow Up Recommendations No PT follow up;Supervision/Assistance - 24 hour (Initially 24 hour supervision)    Equipment Recommendations  None recommended by PT    Recommendations for Other Services       Precautions / Restrictions Precautions Precautions: Back Precaution Booklet Issued: Yes (comment) Precaution Comments: reviewed precautions Required Braces or Orthoses: Spinal Brace Spinal Brace: Lumbar corset;Applied in sitting position Restrictions Weight Bearing Restrictions: No      Mobility  Bed Mobility Overal bed mobility: Needs Assistance Bed Mobility: Rolling;Sidelying to Sit Rolling: Min guard Sidelying to sit: Min guard       General bed mobility comments: Min guard for safety. VC for technique to maintain back precautions  Transfers Overall transfer level: Needs assistance Equipment used: Rolling walker (2 wheeled) Transfers: Sit to/from Stand Sit to Stand: Min guard         General  transfer comment: Pt was standing upon PT entering room. Had pt sit and educated on safety and following precautions including use of back brace. Required min guard for safety with VC for hand placement to safely rise while maintaining back precations. Slow to rise from bed.  Ambulation/Gait Ambulation/Gait assistance: Supervision Ambulation Distance (Feet): 300 Feet Assistive device: Rolling walker (2 wheeled) Gait Pattern/deviations: Step-through pattern;Wide base of support;Decreased stride length;Trunk flexed   Gait velocity interpretation: at or above normal speed for age/gender General Gait Details: Intermittent cues for upright posture with forward gaze and walker placement for proximity. No overt loss of balance noted during bout while holding a RW for support. Widened base of support but stable.  Stairs            Wheelchair Mobility    Modified Rankin (Stroke Patients Only)       Balance Overall balance assessment: Needs assistance Sitting-balance support: No upper extremity supported;Feet supported Sitting balance-Leahy Scale: Good     Standing balance support: No upper extremity supported Standing balance-Leahy Scale: Fair                               Pertinent Vitals/Pain Pain Assessment: 0-10 Pain Score: 8  Pain Location: back Pain Descriptors / Indicators: Constant;Dull Pain Intervention(s): Monitored during session;Repositioned    Home Living Family/patient expects to be discharged to:: Private residence Living Arrangements: Spouse/significant other Available Help at Discharge: Family;Available 24 hours/day Type of Home: House Home Access: Level entry     Home Layout: One level Home Equipment: Walker - 2 wheels      Prior Function Level of Independence: Independent  Hand Dominance        Extremity/Trunk Assessment   Upper Extremity Assessment: Defer to OT evaluation           Lower Extremity  Assessment: RLE deficits/detail RLE Deficits / Details: Grossly 4/5 throughout BIL LEs, however reported "numbness" in posterior calf on Rt which he states became worse in his posterior thigh at end of therapy session.       Communication   Communication: No difficulties  Cognition Arousal/Alertness: Awake/alert Behavior During Therapy: WFL for tasks assessed/performed Overall Cognitive Status: Impaired/Different from baseline Area of Impairment: Safety/judgement     Memory: Decreased recall of precautions   Safety/Judgement: Decreased awareness of safety          General Comments General comments (skin integrity, edema, etc.): Deceased safety awareness. Educated pt and family on how to donne brace appropriately.    Exercises General Exercises - Lower Extremity Long Arc Quad: Strengthening;Both;5 reps;Seated      Assessment/Plan    PT Assessment Patient needs continued PT services  PT Diagnosis Abnormality of gait;Generalized weakness;Acute pain   PT Problem List Decreased strength;Decreased activity tolerance;Decreased balance;Decreased mobility;Decreased knowledge of use of DME;Decreased safety awareness;Decreased knowledge of precautions;Impaired sensation;Pain  PT Treatment Interventions DME instruction;Gait training;Functional mobility training;Therapeutic activities;Therapeutic exercise;Balance training;Neuromuscular re-education;Patient/family education;Modalities   PT Goals (Current goals can be found in the Care Plan section) Acute Rehab PT Goals Patient Stated Goal: Go home PT Goal Formulation: With patient Time For Goal Achievement: 11/29/14 Potential to Achieve Goals: Good    Frequency Min 5X/week   Barriers to discharge        Co-evaluation               End of Session Equipment Utilized During Treatment: Back brace Activity Tolerance: Patient tolerated treatment well Patient left: in chair;with call bell/phone within reach;with chair alarm  set;with family/visitor present Nurse Communication: Mobility status         Time: 4540-9811 PT Time Calculation (min) (ACUTE ONLY): 19 min   Charges:   PT Evaluation $Initial PT Evaluation Tier I: 1 Procedure     PT G CodesBerton Mount 11/15/2014, 6:10 PM  Charlsie Merles, [place] [phone]

## 2014-11-15 NOTE — Anesthesia Preprocedure Evaluation (Addendum)
Anesthesia Evaluation  Patient identified by MRN, date of birth, ID band Patient awake    Reviewed: Allergy & Precautions, NPO status , Patient's Chart, lab work & pertinent test results  Airway Mallampati: II  TM Distance: >3 FB Neck ROM: Full    Dental  (+) Dental Advisory Given, Edentulous Upper,    Pulmonary Current Smoker,    Pulmonary exam normal       Cardiovascular Normal cardiovascular exam    Neuro/Psych    GI/Hepatic   Endo/Other  Morbid obesity  Renal/GU      Musculoskeletal  (+) Arthritis -,   Abdominal   Peds  Hematology   Anesthesia Other Findings   Reproductive/Obstetrics                          Anesthesia Physical Anesthesia Plan  ASA: III  Anesthesia Plan: General   Post-op Pain Management:    Induction: Intravenous  Airway Management Planned: Oral ETT  Additional Equipment:   Intra-op Plan:   Post-operative Plan: Extubation in OR  Informed Consent: I have reviewed the patients History and Physical, chart, labs and discussed the procedure including the risks, benefits and alternatives for the proposed anesthesia with the patient or authorized representative who has indicated his/her understanding and acceptance.   Dental advisory given  Plan Discussed with: CRNA, Anesthesiologist and Surgeon  Anesthesia Plan Comments:       Anesthesia Quick Evaluation

## 2014-11-15 NOTE — Anesthesia Procedure Notes (Addendum)
Procedure Name: Intubation Date/Time: 11/15/2014 7:32 AM Performed by: Rise PatienceBELL, Arizbeth Cawthorn T Pre-anesthesia Checklist: Patient identified, Suction available, Patient being monitored, Emergency Drugs available and Timeout performed Patient Re-evaluated:Patient Re-evaluated prior to inductionOxygen Delivery Method: Circle system utilized Preoxygenation: Pre-oxygenation with 100% oxygen Intubation Type: IV induction Ventilation: Mask ventilation without difficulty and Oral airway inserted - appropriate to patient size Laryngoscope Size: Mac and 4 Grade View: Grade I Tube type: Oral Tube size: 8.0 mm Number of attempts: 1 Airway Equipment and Method: Stylet and Oral airway Placement Confirmation: ETT inserted through vocal cords under direct vision,  positive ETCO2,  CO2 detector and breath sounds checked- equal and bilateral Secured at: 23 cm Tube secured with: Tape Dental Injury: Teeth and Oropharynx as per pre-operative assessment  Comments: Intubation by Kandace ParkinsKelsey White, SRNA

## 2014-11-15 NOTE — Anesthesia Postprocedure Evaluation (Signed)
  Anesthesia Post-op Note  Patient: Eduardo Barnes  Procedure(s) Performed: Procedure(s): POSTERIOR LUMBAR INTERBODY FUSION LUMBAR FIVE-SACRAL ONE,INSTRUMENTED FUSION LUMBAR FOUR-SACRAL ONE (N/A)  Patient Location: PACU  Anesthesia Type:General  Level of Consciousness: awake, alert , oriented and patient cooperative  Airway and Oxygen Therapy: Patient Spontanous Breathing  Post-op Pain: mild, moderate  Post-op Assessment: Post-op Vital signs reviewed, Patient's Cardiovascular Status Stable, Respiratory Function Stable, Patent Airway, No signs of Nausea or vomiting and Pain level controlled              Post-op Vital Signs: stable  Last Vitals:  Filed Vitals:   11/15/14 1129  BP: 134/73  Pulse: 109  Temp: 36.6 C  Resp: 36    Complications: No apparent anesthesia complications

## 2014-11-15 NOTE — Transfer of Care (Signed)
Immediate Anesthesia Transfer of Care Note  Patient: Eduardo Barnes  Procedure(s) Performed: Procedure(s): POSTERIOR LUMBAR INTERBODY FUSION LUMBAR FIVE-SACRAL ONE,INSTRUMENTED FUSION LUMBAR FOUR-SACRAL ONE (N/A)  Patient Location: PACU  Anesthesia Type:General  Level of Consciousness: awake, alert , oriented and patient cooperative  Airway & Oxygen Therapy: Patient Spontanous Breathing and Patient connected to nasal cannula oxygen  Post-op Assessment: Report given to RN, Post -op Vital signs reviewed and stable and Patient moving all extremities X 4  Post vital signs: Reviewed and stable  Last Vitals:  Filed Vitals:   11/15/14 0612  BP: 113/63  Pulse: 78  Temp: 36.6 C  Resp: 20    Complications: No apparent anesthesia complications

## 2014-11-15 NOTE — Addendum Note (Signed)
Addendum  created 11/15/14 1159 by Laverle HobbyGregory Iain Sawchuk, MD   Modules edited: Orders, PRL Based Order Sets

## 2014-11-16 MED ORDER — ALUM & MAG HYDROXIDE-SIMETH 200-200-20 MG/5ML PO SUSP: 30.0000 mL | Freq: Four times a day (QID) | ORAL | Status: DC | PRN

## 2014-11-16 MED ORDER — OXYCODONE-ACETAMINOPHEN 5-325 MG PO TABS: 2.0000 | ORAL_TABLET | ORAL | Status: DC | PRN

## 2014-11-16 NOTE — Care Management Note (Signed)
Case Management Note  Patient Details  Name: Pollyann GlenSteven T Magnan MRN: [id number] Date of Birth: 10/07/1975  Subjective/Objective:                    Action/Plan: [facility] was notified of order for rolling walker for discharge home today.  Expected Discharge Date:                  Expected Discharge Plan:  Home/Self Care  In-House Referral:     Discharge planning Services  CM Consult  Post Acute Care Choice:  Durable Medical Equipment Choice offered to:     DME Arranged:  Walker rolling DME Agency:  [facility]  HH Arranged:    HH Agency:     Status of Service:  Completed, signed off  Medicare Important Message Given:    Date Medicare IM Given:    Medicare IM give by:    Date Additional Medicare IM Given:    Additional Medicare Important Message give by:     If discussed at Long Length of Stay Meetings, dates discussed:    Additional Comments:  Anda KraftRobarge, Shatora Weatherbee C, RN 11/16/2014, 10:22 AM

## 2014-11-16 NOTE — Progress Notes (Signed)
RN called patient phone number to contact regarding prescription. Per Dr. Edward QualiaJone's office, no prescription at office. Office made aware that prescription is now at [facility].

## 2014-11-16 NOTE — Discharge Instructions (Signed)
Spinal Fusion Care After Refer to this sheet in the next few weeks. These instructions provide you with information on caring for yourself after your procedure. Your caregiver may also give you more specific instructions. Your treatment has been planned according to current medical practices, but problems sometimes occur. Call your caregiver if you have any problems or questions after your procedure. HOME CARE INSTRUCTIONS   Take whatever pain medicine has been prescribed by your caregiver. Do not take over-the-counter pain medicine unless directed otherwise by your caregiver.  Do not drive if you are taking narcotic pain medicines.  Change your bandage (dressing) if necessary or as directed by your caregiver.  Do not get your surgical cut (incision) wet. After a few days you may take quick showers (rather than baths), but keep your incision clean and dry. Covering the incision with plastic wrap while you shower should keep your incision dry. A few weeks after surgery, once your incision has healed and your caregiver says it is okay, you can take baths or go swimming.  If you have been prescribed medicine to prevent your blood from clotting, follow the directions carefully.  Check the area around your incision often. Look for redness and swelling. Also, look for anything leaking from your wound. You can use a mirror or have a family member inspect your incision if it is in a place where it is difficult for you to see.  Ask your caregiver what activities you should avoid and for how long.  Walk as much as possible.  Do not lift anything heavier than 10 pounds (4.5 kilograms) until your caregiver says it is safe.  Do not twist or bend for a few weeks. Try not to pull on things. Avoid sitting for long periods of time. Change positions at least every hour.  Ask your caregiver what kinds of exercise you should do to make your back stronger and when you should begin doing these exercises. SEEK  IMMEDIATE MEDICAL CARE IF:   Pain suddenly becomes much worse.  The incision area is red, swollen, bleeding, or leaking fluid.  Your legs or feet become increasingly painful, numb, weak, or swollen.  You have trouble controlling urination or bowel movements.  You have trouble breathing.  You have chest pain.  You have a fever. MAKE SURE YOU:  Understand these instructions.  Will watch your condition.  Will get help right away if you are not doing well or get worse. Document Released: 11/07/2004 Document Revised: 07/13/2011 Document Reviewed: 07/03/2010 [facility] Patient Information 2015 [place], [place]. This information is not intended to replace advice given to you by your health care provider. Make sure you discuss any questions you have with your health care provider.  Acetaminophen; Oxycodone tablets What is this medicine? ACETAMINOPHEN; OXYCODONE (a set a MEE noe fen; ox i KOE done) is a pain reliever. It is used to treat mild to moderate pain. This medicine may be used for other purposes; ask your health care provider or pharmacist if you have questions. COMMON BRAND NAME(S): Endocet, Magnacet, Narvox, Percocet, Perloxx, Primalev, Primlev, Roxicet, Xolox What should I tell my health care provider before I take this medicine? They need to know if you have any of these conditions: -brain tumor -Crohn's disease, inflammatory bowel disease, or ulcerative colitis -drug abuse or addiction -head injury -heart or circulation problems -if you often drink alcohol -kidney disease or problems going to the bathroom -liver disease -lung disease, asthma, or breathing problems -an unusual or allergic reaction to  acetaminophen, oxycodone, other opioid analgesics, other medicines, foods, dyes, or preservatives °-pregnant or trying to get pregnant °-breast-feeding °How should I use this medicine? °Take this medicine by mouth with a full glass of water. Follow the directions on the  prescription label. Take your medicine at regular intervals. Do not take your medicine more often than directed. °Talk to your pediatrician regarding the use of this medicine in children. Special care may be needed. °Patients over 65 years old may have a stronger reaction and need a smaller dose. °Overdosage: If you think you have taken too much of this medicine contact a poison control center or emergency room at once. °NOTE: This medicine is only for you. Do not share this medicine with others. °What if I miss a dose? °If you miss a dose, take it as soon as you can. If it is almost time for your next dose, take only that dose. Do not take double or extra doses. °What may interact with this medicine? °-alcohol °-antihistamines °-barbiturates like amobarbital, butalbital, butabarbital, methohexital, pentobarbital, phenobarbital, thiopental, and secobarbital °-benztropine °-drugs for bladder problems like solifenacin, trospium, oxybutynin, tolterodine, hyoscyamine, and methscopolamine °-drugs for breathing problems like ipratropium and tiotropium °-drugs for certain stomach or intestine problems like propantheline, homatropine methylbromide, glycopyrrolate, atropine, belladonna, and dicyclomine °-general anesthetics like etomidate, ketamine, nitrous oxide, propofol, desflurane, enflurane, halothane, isoflurane, and sevoflurane °-medicines for depression, anxiety, or psychotic disturbances °-medicines for sleep °-muscle relaxants °-naltrexone °-narcotic medicines (opiates) for pain °-phenothiazines like perphenazine, thioridazine, chlorpromazine, mesoridazine, fluphenazine, prochlorperazine, promazine, and trifluoperazine °-scopolamine °-tramadol °-trihexyphenidyl °This list may not describe all possible interactions. Give your health care provider a list of all the medicines, herbs, non-prescription drugs, or dietary supplements you use. Also tell them if you smoke, drink alcohol, or use illegal drugs. Some items may  interact with your medicine. °What should I watch for while using this medicine? °Tell your doctor or health care professional if your pain does not go away, if it gets worse, or if you have new or a different type of pain. You may develop tolerance to the medicine. Tolerance means that you will need a higher dose of the medication for pain relief. Tolerance is normal and is expected if you take this medicine for a long time. °Do not suddenly stop taking your medicine because you may develop a severe reaction. Your body becomes used to the medicine. This does NOT mean you are addicted. Addiction is a behavior related to getting and using a drug for a non-medical reason. If you have pain, you have a medical reason to take pain medicine. Your doctor will tell you how much medicine to take. If your doctor wants you to stop the medicine, the dose will be slowly lowered over time to avoid any side effects. °You may get drowsy or dizzy. Do not drive, use machinery, or do anything that needs mental alertness until you know how this medicine affects you. Do not stand or sit up quickly, especially if you are an older patient. This reduces the risk of dizzy or fainting spells. Alcohol may interfere with the effect of this medicine. Avoid alcoholic drinks. °There are different types of narcotic medicines (opiates) for pain. If you take more than one type at the same time, you may have more side effects. Give your health care provider a list of all medicines you use. Your doctor will tell you how much medicine to take. Do not take more medicine than directed. Call emergency for help if you have   problems breathing. The medicine will cause constipation. Try to have a bowel movement at least every 2 to 3 days. If you do not have a bowel movement for 3 days, call your doctor or health care professional. Do not take Tylenol (acetaminophen) or medicines that have acetaminophen with this medicine. Too much acetaminophen can be very  dangerous. Many nonprescription medicines contain acetaminophen. Always read the labels carefully to avoid taking more acetaminophen. What side effects may I notice from receiving this medicine? Side effects that you should report to your doctor or health care professional as soon as possible: -allergic reactions like skin rash, itching or hives, swelling of the face, lips, or tongue -breathing difficulties, wheezing -confusion -light headedness or fainting spells -severe stomach pain -unusually weak or tired -yellowing of the skin or the whites of the eyes Side effects that usually do not require medical attention (report to your doctor or health care professional if they continue or are bothersome): -dizziness -drowsiness -nausea -vomiting This list may not describe all possible side effects. Call your doctor for medical advice about side effects. You may report side effects to FDA at 1-800-FDA-1088. Where should I keep my medicine? Keep out of the reach of children. This medicine can be abused. Keep your medicine in a safe place to protect it from theft. Do not share this medicine with anyone. Selling or giving away this medicine is dangerous and against the law. Store at room temperature between 20 and 25 degrees C (68 and 77 degrees F). Keep container tightly closed. Protect from light. This medicine may cause accidental overdose and death if it is taken by other adults, children, or pets. Flush any unused medicine down the toilet to reduce the chance of harm. Do not use the medicine after the expiration date. NOTE: This sheet is a summary. It may not cover all possible information. If you have questions about this medicine, talk to your doctor, pharmacist, or health care provider.  2015, [employer]. (2012-12-12 13:17:35)

## 2014-11-16 NOTE — Progress Notes (Signed)
Patient discharged home with wife. RN discussed discharge instructions including follow up appointment, prescriptions, back precautions. Per patient, prescription to be picked up at Dr. Yetta BarreJones office. Discharge instructions given to patient.

## 2014-11-16 NOTE — Discharge Summary (Signed)
  Physician Discharge Summary  Patient ID: Eduardo Barnes MRN: [id number] DOB/AGE: [birth year] 39 y.o.  Admit date: 11/15/2014 Discharge date: 11/16/2014  Admission Diagnoses: Spondylolisthesis L5-S1  Discharge Diagnoses: Same Active Problems:   S/P lumbar spinal fusion   Discharged Condition: good  Hospital Course: Patient is admitted hospital underwent decompressive laminectomy and fusion from L4-S1 postop patient did very well recovered in the floor on the floor he was ambulating and voiding tolerating regular diet and stable for discharge home.  Consults: Significant Diagnostic Studies: Treatments: L4-S1 fusion decompression L5-S1 Discharge Exam: Blood pressure 110/55, pulse 89, temperature 98.8 F (37.1 C), temperature source Oral, resp. rate 17, height 5\' 9"  (1.753 m), weight 101.691 kg (224 lb 3 oz), SpO2 98 %.   DStrength out of 5 wound clean dry and intactisHome     Medication List    TAKE these medications        oxyCODONE-acetaminophen 10-325 MG per tablet  Commonly known as:  PERCOCET  Take 1 tablet by mouth every 4 (four) hours as needed for pain.     oxyCODONE-acetaminophen 5-325 MG per tablet  Commonly known as:  PERCOCET/ROXICET  Take 2 tablets by mouth every 4 (four) hours as needed for moderate pain.     tiZANidine 4 MG capsule  Commonly known as:  ZANAFLEX  Take 4 mg by mouth 3 (three) times daily as needed for muscle spasms.         Signed: Hana Trippett P 11/16/2014, 8:57 AM

## 2014-11-16 NOTE — Progress Notes (Signed)
Physical Therapy Treatment Patient Details Name: Pollyann GlenSteven T Dewberry MRN: [id number] DOB: [birth year] Today's Date: 11/16/2014    History of Present Illness 39 y.o. male s/p POSTERIOR LUMBAR INTERBODY FUSION LUMBAR FIVE-SACRAL ONE    PT Comments    Patient progressing well towards physical therapy goals. Ambulating without loss of balance, using a rolling walker for support. Still complains of numbness in posterior Rt limb extending to heel. Reviewed back precautions and donnes brace appropriately. Adequate for d/c from mobility standpoint.  Follow Up Recommendations  No PT follow up;Supervision/Assistance - 24 hour (Initially 24 hour supervision)     Equipment Recommendations  None recommended by PT    Recommendations for Other Services       Precautions / Restrictions Precautions Precautions: Back Precaution Comments: reviewed precautions Required Braces or Orthoses: Spinal Brace Spinal Brace: Lumbar corset;Applied in sitting position Restrictions Weight Bearing Restrictions: No    Mobility  Bed Mobility Overal bed mobility: Needs Assistance Bed Mobility: Rolling;Sidelying to Sit;Sit to Sidelying Rolling: Supervision Sidelying to sit: Supervision     Sit to sidelying: Supervision General bed mobility comments: supervision for safety. VC for techniques. Able to perform without breaking precautions.  Transfers Overall transfer level: Needs assistance Equipment used: Rolling walker (2 wheeled) Transfers: Sit to/from Stand Sit to Stand: Supervision         General transfer comment: Supervision for safety. Performed from lowest bed setting. VC for hand placement prior to rising and sitting back onto bed. Better strength today.  Ambulation/Gait Ambulation/Gait assistance: Supervision Ambulation Distance (Feet): 525 Feet Assistive device: Rolling walker (2 wheeled) Gait Pattern/deviations: Step-through pattern;Decreased stride length;Trunk flexed   Gait velocity  interpretation: at or above normal speed for age/gender General Gait Details: Intermittent cues for upright posture with forward gaze and walker placement for proximity. No overt loss of balance noted during bout while holding a RW for support. Widened base of support but stable.   Stairs            Wheelchair Mobility    Modified Rankin (Stroke Patients Only)       Balance                                    Cognition Arousal/Alertness: Awake/alert Behavior During Therapy: WFL for tasks assessed/performed Overall Cognitive Status: Within Functional Limits for tasks assessed                      Exercises      General Comments General comments (skin integrity, edema, etc.): Able to safely donne brace      Pertinent Vitals/Pain Pain Assessment: Faces Faces Pain Scale: Hurts a little bit Pain Location: back Pain Descriptors / Indicators: Constant Pain Intervention(s): Monitored during session;Repositioned    Home Living                      Prior Function            PT Goals (current goals can now be found in the care plan section) Acute Rehab PT Goals Patient Stated Goal: Go home PT Goal Formulation: With patient Time For Goal Achievement: 11/29/14 Potential to Achieve Goals: Good Progress towards PT goals: Progressing toward goals    Frequency  Min 5X/week    PT Plan Current plan remains appropriate    Co-evaluation             End of  Session Equipment Utilized During Treatment: Back brace Activity Tolerance: Patient tolerated treatment well Patient left: with call bell/phone within reach;in bed     Time: 4098-1191 PT Time Calculation (min) (ACUTE ONLY): 18 min  Charges:  $Gait Training: 8-22 mins                    G Codes:      Berton Mount 12/06/2014, 9:01 AM Charlsie Merles, PT [phone]

## 2014-11-16 NOTE — Evaluation (Signed)
Occupational Therapy Evaluation Patient Details Name: Eduardo Barnes T Douthit MRN: [id number] DOB: [birth year] Today's Date: 11/16/2014    History of Present Illness 39 y.o. male s/p POSTERIOR LUMBAR INTERBODY FUSION LUMBAR FIVE-SACRAL ONE   Clinical Impression   Patient is s/p L5-S1 fusion surgery resulting in functional limitations due to the deficits listed below (see OT problem list). Pt able to verbalize 3/3 precautions. Education provided on maintaining precautions during ADLs; Pt demonstrated ability to complete ADLs with supervision. Patient will benefit from skilled OT acutely to increase independence and safety with ADLS to allow discharge home.    Follow Up Recommendations  No OT follow up;Supervision/Assistance - 24 hour    Equipment Recommendations  None recommended by OT    Recommendations for Other Services       Precautions / Restrictions Precautions Precautions: Back Precaution Booklet Issued: Yes (comment) Precaution Comments: pt able to verbalize 3/3 precautions Required Braces or Orthoses: Spinal Brace Spinal Brace: Lumbar corset;Applied in sitting position Restrictions Weight Bearing Restrictions: No      Mobility Bed Mobility Overal bed mobility: Modified Independent Bed Mobility: Rolling;Sidelying to Sit Rolling: Modified independent (Device/Increase time) Sidelying to sit: Modified independent (Device/Increase time)     Sit to sidelying: Supervision General bed mobility comments: supervision for safety. VC for techniques. Able to perform without breaking precautions.  Transfers Overall transfer level: Needs assistance Equipment used: Rolling walker (2 wheeled) Transfers: Sit to/from Stand Sit to Stand: Supervision         General transfer comment: Supervision for safety. Performed from lowest bed setting. VC for hand placement prior to rising and sitting back onto bed. Better strength today.    Balance Overall balance assessment: Needs  assistance Sitting-balance support: No upper extremity supported;Feet supported Sitting balance-Leahy Scale: Good     Standing balance support: No upper extremity supported;During functional activity Standing balance-Leahy Scale: Fair Standing balance comment: RW for support                            ADL Overall ADL's : Needs assistance/impaired Eating/Feeding: Independent;Sitting   Grooming: Modified independent;Standing   Upper Body Bathing: Minimal assitance;Sitting Upper Body Bathing Details (indicate cue type and reason): Pt reports wife to assist Lower Body Bathing: Minimal assistance;Sit to/from stand Lower Body Bathing Details (indicate cue type and reason): pt reports wife to assist Upper Body Dressing : Supervision/safety;Sitting;Standing (don/ adjust brace)   Lower Body Dressing: Supervision/safety;Sit to/from stand (don socks)   Toilet Transfer: Supervision/safety;Ambulation;Regular Toilet;RW   Toileting- ArchitectClothing Manipulation and Hygiene: Sit to/from stand;Adhering to back precautions;Modified independent   Tub/ Shower Transfer: Walk-in shower;Supervision/safety;Rolling walker;Adhering to back precautions   Functional mobility during ADLs: Modified independent;Rolling walker General ADL Comments: Pt able to maintain precautions during ADLs; reports wife available to assist upon d/c     Vision Vision Assessment?: No apparent visual deficits   Perception     Praxis      Pertinent Vitals/Pain Pain Assessment: No/denies pain Faces Pain Scale: Hurts a little bit Pain Location: back Pain Descriptors / Indicators: Constant Pain Intervention(s): Monitored during session;Repositioned     Hand Dominance Right   Extremity/Trunk Assessment Upper Extremity Assessment Upper Extremity Assessment: Overall WFL for tasks assessed   Lower Extremity Assessment Lower Extremity Assessment: Defer to PT evaluation;Overall WFL for tasks assessed        Communication Communication Communication: No difficulties   Cognition Arousal/Alertness: Awake/alert Behavior During Therapy: WFL for tasks assessed/performed Overall Cognitive Status: Within Functional  Limits for tasks assessed                     General Comments       Exercises       Shoulder Instructions      Home Living Family/patient expects to be discharged to:: Private residence Living Arrangements: Spouse/significant other;Children Available Help at Discharge: Family;Available 24 hours/day Type of Home: House Home Access: Level entry     Home Layout: One level     Bathroom Shower/Tub: Producer, television/film/video: Standard Bathroom Accessibility: No   Home Equipment: Walker - 2 wheels          Prior Functioning/Environment Level of Independence: Independent             OT Diagnosis: Generalized weakness   OT Problem List: Decreased strength;Impaired balance (sitting and/or standing);Decreased knowledge of use of DME or AE;Pain   OT Treatment/Interventions:      OT Goals(Current goals can be found in the care plan section) Acute Rehab OT Goals Patient Stated Goal: Go home OT Goal Formulation: With patient Time For Goal Achievement: 11/30/14 Potential to Achieve Goals: Good  OT Frequency:     Barriers to D/C:            Co-evaluation              End of Session Equipment Utilized During Treatment: Gait belt;Rolling walker;Back brace Nurse Communication: Mobility status;Precautions  Activity Tolerance: Patient tolerated treatment well;No increased pain Patient left: in bed;with call bell/phone within reach;with nursing/sitter in room   Time: 0950-1005 OT Time Calculation (min): 15 min Charges:  OT General Charges $OT Visit: 1 Procedure OT Evaluation $Initial OT Evaluation Tier I: 1 Procedure G-Codes:    Marden Noble 11/16/2014, 11:16 AM

## 2014-11-16 NOTE — Progress Notes (Signed)
Patient ID: Eduardo Barnes, male   DOB: Nov 27, 1975, 39 y.o.   MRN: [id number] Doing well no leg pain back pain well-controlled  Strength out of 5 wound clean dry and intact  Discharge home

## 2014-11-16 NOTE — Progress Notes (Signed)
1 mg morphine wasted with charge RN, Asher MuirJamie and [employer] Jon[employer]. Pharmacy aware. Patient left and was discharged from system and pyxis before RN could waste.

## 2014-11-17 ENCOUNTER — Emergency Department (HOSPITAL_COMMUNITY)
Admission: EM | Admit: 2014-11-17 | Discharge: 2014-11-18 | Disposition: A | Discharge status: Home / Self Care | Payer: BLUE CROSS/BLUE SHIELD | Attending: Emergency Medicine | Admitting: Emergency Medicine

## 2014-11-17 ENCOUNTER — Encounter (HOSPITAL_COMMUNITY): Payer: Self-pay | Admitting: *Deleted

## 2014-11-17 DIAGNOSIS — G8929 Other chronic pain: Secondary | ICD-10-CM | POA: Insufficient documentation

## 2014-11-17 DIAGNOSIS — M1990 Unspecified osteoarthritis, unspecified site: Secondary | ICD-10-CM | POA: Diagnosis not present

## 2014-11-17 DIAGNOSIS — M545 Low back pain, unspecified: Secondary | ICD-10-CM

## 2014-11-17 DIAGNOSIS — R55 Syncope and collapse: Secondary | ICD-10-CM | POA: Diagnosis not present

## 2014-11-17 DIAGNOSIS — Z720 Tobacco use: Secondary | ICD-10-CM | POA: Insufficient documentation

## 2014-11-17 LAB — BASIC METABOLIC PANEL
ANION GAP: 9 (ref 5–15)
BUN: 7 mg/dL (ref 6–20)
CALCIUM: 9.1 mg/dL (ref 8.9–10.3)
CO2: 24 mmol/L (ref 22–32)
CREATININE: 0.9 mg/dL (ref 0.61–1.24)
Chloride: 100 mmol/L — ABNORMAL LOW (ref 101–111)
GFR calc Af Amer: >60 mL/min (ref >60)
GFR calc non Af Amer: >60 mL/min (ref >60)
Glucose, Bld: 108 mg/dL — ABNORMAL HIGH (ref 65–99)
Potassium: 3.6 mmol/L (ref 3.5–5.1)
Sodium: 133 mmol/L — ABNORMAL LOW (ref 135–145)

## 2014-11-17 LAB — URINALYSIS, ROUTINE W REFLEX MICROSCOPIC
Bilirubin Urine: NEGATIVE
Glucose, UA: NEGATIVE mg/dL
HGB URINE DIPSTICK: NEGATIVE
Ketones, ur: NEGATIVE mg/dL
Leukocytes, UA: NEGATIVE
NITRITE: NEGATIVE
Protein, ur: NEGATIVE mg/dL
Specific Gravity, Urine: 1.013 (ref 1.005–1.030)
UROBILINOGEN UA: 0.2 mg/dL (ref 0.0–1.0)
pH: 6 (ref 5.0–8.0)

## 2014-11-17 LAB — CBC
HCT: 41.2 % (ref 39.0–52.0)
Hemoglobin: 14.2 g/dL (ref 13.0–17.0)
MCH: 30.9 pg (ref 26.0–34.0)
MCHC: 34.5 g/dL (ref 30.0–36.0)
MCV: 89.8 fL (ref 78.0–100.0)
Platelets: 273 10*3/uL (ref 150–400)
RBC: 4.59 MIL/uL (ref 4.22–5.81)
RDW: 14.2 % (ref 11.5–15.5)
WBC: 22 10*3/uL — ABNORMAL HIGH (ref 4.0–10.5)

## 2014-11-17 LAB — CBG MONITORING, ED: Glucose-Capillary: 104 mg/dL — ABNORMAL HIGH (ref 65–99)

## 2014-11-17 MED ORDER — HYDROMORPHONE HCL 1 MG/ML IJ SOLN
1.0000 mg | Freq: Once | INTRAMUSCULAR | Status: AC
  Administered 2014-11-17: 1 mg via INTRAVENOUS
  Filled 2014-11-17: qty 1

## 2014-11-17 MED ORDER — DIAZEPAM 5 MG PO TABS
5.0000 mg | ORAL_TABLET | Freq: Once | ORAL | Status: AC
  Administered 2014-11-17: 5 mg via ORAL
  Filled 2014-11-17: qty 1

## 2014-11-17 MED ORDER — SODIUM CHLORIDE 0.9 % IV BOLUS (SEPSIS)
1000.0000 mL | Freq: Once | INTRAVENOUS | Status: AC
  Administered 2014-11-17: 1000 mL via INTRAVENOUS

## 2014-11-17 MED ORDER — FENTANYL CITRATE (PF) 100 MCG/2ML IJ SOLN
100.0000 ug | Freq: Once | INTRAMUSCULAR | Status: AC
  Administered 2014-11-17: 100 ug via INTRAVENOUS
  Filled 2014-11-17: qty 2

## 2014-11-17 NOTE — ED Notes (Signed)
Pt stating he doesn't think he can lay flat on his back to perform MRI. Pt resting comfortably in bed playing on cell phone. PA-C notified.

## 2014-11-17 NOTE — ED Provider Notes (Signed)
CSN: [id number]     Arrival date & time 11/17/14  1731 History   First MD Initiated Contact with Patient 11/17/14 1852     Chief Complaint  Patient presents with  . Loss of Consciousness     (Consider location/radiation/quality/duration/timing/severity/associated sxs/prior Treatment) HPI Patient presents to the emergency department with increasing back pain over the last few days along with a loss of consciousness today.  The patient states that he stood up after attempting to have a bowel movement and then states he felt lightheaded and walk to the bedroom and the wife states that he had a syncopal episode.  Patient states that he has not been eating and drinking as well since the surgery.  Patient denies nausea, vomiting, weakness, dizziness, headache, blurred vision, chest pain, shortness of breath, abdominal pain, fever, leg weakness or rash.  The patient states that nothing seems make his condition, better or worse Past Medical History  Diagnosis Date  . DDD (degenerative disc disease), lumbar   . Arthritis     "probably a touch in my back" (11/15/2014)  . Chronic lower back pain    Past Surgical History  Procedure Laterality Date  . Tonsillectomy    . Anterior cervical decomp/discectomy fusion N/A 08/30/2014    Procedure: ANTERIOR CERVICAL DECOMPRESSION/DISCECTOMY FUSION CERVICAL FIVE-SIX,CERVICAL SIX-SEVEN;  Surgeon: Tia Alert, MD;  Location: [facility];  Service: Neurosurgery;  Laterality: N/A;  . Vascular surgery    . Posterior lumbar fusion  11/15/2014   Family History  Problem Relation Age of Onset  . Heart disease Mother   . Heart attack Father    History  Substance Use Topics  . Smoking status: Current Every Day Smoker -- 0.50 packs/day for 20 years    Types: Cigarettes  . Smokeless tobacco: Never Used     Comment: 11/15/2014 "down from 1 1/2 ppd in 08/2014"  . 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 All pertinent positives and negatives as reviewed in the HPI.  Allergies  Review of patient's allergies indicates no known allergies.  Home Medications   Prior to Admission medications   Medication Sig Start Date End Date Taking? Authorizing Provider  oxyCODONE-acetaminophen (PERCOCET) 10-325 MG per tablet Take 1 tablet by mouth every 4 (four) hours as needed for pain.   Yes Historical Provider, MD  tiZANidine (ZANAFLEX) 4 MG capsule Take 4 mg by mouth 3 (three) times daily as needed for muscle spasms.  10/22/14  Yes Historical Provider, MD  oxyCODONE-acetaminophen (PERCOCET/ROXICET) 5-325 MG per tablet Take 2 tablets by mouth every 4 (four) hours as needed for moderate pain. Patient not taking: Reported on 11/17/2014 11/16/14   Donalee Citrin, MD   BP 112/51 mmHg  Pulse 92  Temp(Src) 98.2 F (36.8 C) (Oral)  Resp 23  SpO2 97% Physical Exam  Constitutional: He is oriented to person, place, and time. He appears well-developed and well-nourished. No distress.  HENT:  Head: Normocephalic and atraumatic.  Mouth/Throat: Oropharynx is clear and moist.  Eyes: Pupils are equal, round, and reactive to light.  Neck: Normal range of motion. Neck supple.  Cardiovascular: Normal rate, regular rhythm and normal heart sounds.  Exam reveals no gallop and no friction rub.   No murmur heard. Pulmonary/Chest: Effort normal and breath sounds normal. No respiratory distress.  Abdominal: Soft. Bowel sounds are normal. He exhibits  no distension. There is no tenderness.  Neurological: He is alert and oriented to person, place, and time. He has normal strength and normal reflexes. He displays normal reflexes. No sensory deficit. He exhibits normal muscle tone. Coordination and gait normal. GCS eye subscore is 4. GCS verbal subscore is 5. GCS motor subscore is 6.  Nursing note and vitals reviewed.   ED Course  Procedures (including critical  care time) Labs Review Labs Reviewed  BASIC METABOLIC PANEL - Abnormal; Notable for the following:    Sodium 133 (*)    Chloride 100 (*)    Glucose, Bld 108 (*)    All other components within normal limits  CBC - Abnormal; Notable for the following:    WBC 22.0 (*)    All other components within normal limits  CBG MONITORING, ED - Abnormal; Notable for the following:    Glucose-Capillary 104 (*)    All other components within normal limits  URINALYSIS, ROUTINE W REFLEX MICROSCOPIC (NOT [facility])   The patient states that he would like to forego the MRI and I did speak with Dr. Wynetta Emerycram of neurosurgery, who advised that it seems very unlikely that he has an infectious process at this point and did advise him to return here for any worsening in his condition.  Also advised him to follow-up Monday with his neurosurgeon for a recheck.  The patient's syncope was most likely related to a vasovagal response from using the bathroom and then standing up.   Charlestine NightChristopher Chrissi Crow, PA-C 11/17/14 2353  Linwood DibblesJon Knapp, MD 11/18/14 [phone]

## 2014-11-17 NOTE — Discharge Instructions (Signed)
Return here as needed.  Follow-up with your neurosurgeon on Monday for recheck

## 2014-11-17 NOTE — ED Notes (Signed)
Patient C/O being dizzy

## 2014-11-17 NOTE — ED Notes (Addendum)
Pt reports possible syncopal episode today after using restroom. Recent lower back surgery. Appears in no acute distress at triage. Family reports pt had jerking, possible seizure activity while laying on the ground.

## 2014-11-19 ENCOUNTER — Encounter (HOSPITAL_COMMUNITY): Payer: Self-pay | Admitting: Neurological Surgery

## 2014-12-25 ENCOUNTER — Encounter: Payer: Self-pay | Admitting: Neurology

## 2014-12-25 ENCOUNTER — Ambulatory Visit (INDEPENDENT_AMBULATORY_CARE_PROVIDER_SITE_OTHER): Payer: BLUE CROSS/BLUE SHIELD | Admitting: Neurology

## 2014-12-25 VITALS — BP 130/84 | HR 86 | Ht 69.0 in | Wt 227.0 lb

## 2014-12-25 DIAGNOSIS — R410 Disorientation, unspecified: Secondary | ICD-10-CM | POA: Diagnosis not present

## 2014-12-25 NOTE — Progress Notes (Signed)
PATIENT: Eduardo Barnes DOB: [birth year]  Chief Complaint  Patient presents with  . Seizures    He is here with his wife, [name], to be evaluated for a seizure on 11/17/14.  This occurred two days after having back surgery with Dr. Marikay Alar on 11/15/14.  He was treated in the ED but not placed on any anticonvulsant medication.  He feels this may have been caused by tizanidine.     HISTORICAL  Eduardo Barnes is a 39 years old right-handed male, accompanied by his wife seen in refer by  neurosurgeon Dr. Marikay Alar for evaluation of seizure-like event  He had history of cervical and lumbar degenerative disc disease, had cervical C5-6, C6-7 decompression surgery in April 2016, lumbar decompression surgery L4-5, L5-S1 in November 15 2014.  In November 17 2014, he was in a lot of low back pain, attempted to to have a bowel movement without success, sat on toilet for 15 minutes, then he decided to get up, fell landed on his knee, started with body jerking movement, when his wife hold him up, noticed he make noises in his throat, then went limp, slept afterwards, he presented to the emergency room, was considered due to syncope, medication side effect, he was taking tizanidine 4 mg 4 times a day,  Now he is on Valium 5 mg 3-4 times a day as needed, denier history of benzodiazepine withdrawal, denied previous history of seizure  Over the past 4 weeks, he continue complains of intermittent lightheadedness, dizziness, sometimes transient confusion,  In December 24 2014, while driving, he had sudden onset lightheadedness, dizziness, transient confusion went to sleep, later on was also noticed by his wife, he has slurred speech, mild confusion, lasting for a few hours  I have personally reviewed MRI of the brain without contrast December 15 2014, that was normal.  REVIEW OF SYSTEMS: Full 14 system review of systems performed and notable only for fatigue, urination problems, easy bruising, confusion,  headaches, weakness, dizziness, seizure, decreased energy  ALLERGIES: No Known Allergies  HOME MEDICATIONS: Current Outpatient Prescriptions  Medication Sig Dispense Refill  . diazepam (VALIUM) 5 MG tablet TAKE 1 TABLET BY MOUTH EVERY 8 HOURS AS NEEDED FOR SPASMS  0  . oxyCODONE-acetaminophen (PERCOCET) 10-325 MG per tablet Take 1 tablet by mouth every 4 (four) hours as needed for pain.     No current facility-administered medications for this visit.    PAST MEDICAL HISTORY: Past Medical History  Diagnosis Date  . DDD (degenerative disc disease), lumbar   . Arthritis     "probably a touch in my back" (11/15/2014)  . Chronic lower back pain   . Neck pain   . Seizure     PAST SURGICAL HISTORY: Past Surgical History  Procedure Laterality Date  . Tonsillectomy    . Anterior cervical decomp/discectomy fusion N/A 08/30/2014    Procedure: ANTERIOR CERVICAL DECOMPRESSION/DISCECTOMY FUSION CERVICAL FIVE-SIX,CERVICAL SIX-SEVEN;  Surgeon: Tia Alert, MD;  Location: [facility];  Service: Neurosurgery;  Laterality: N/A;  . Vascular surgery    . Posterior lumbar fusion  11/15/2014    FAMILY HISTORY: Family History  Problem Relation Age of Onset  . Heart disease Mother   . Heart attack Father   . Seizures Mother   . Migraines Mother     SOCIAL HISTORY:  Social History   Social History  . Marital Status: Married    Spouse Name: N/A  . Number of Children: 0  . Years  of Education: GED   Occupational History  . Unemployed    Social History Main Topics  . Smoking status: Current Every Day Smoker -- 0.50 packs/day for 20 years    Types: Cigarettes  . Smokeless tobacco: Never Used     Comment: 11/15/2014 "down from 1 1/2 ppd in 08/2014"  . 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 Drug Use: No  . Sexual Activity: Not Currently   Other Topics Concern  . Not on file   Social History Narrative   Lives at home with his wife  and step-children.   Right-handed.   3/4 - 1 two liter of soda per day.     PHYSICAL EXAM   Filed Vitals:   12/25/14 0929  BP: 130/84  Pulse: 86  Height:  (1.753 m)  Weight: 227 lb (102.967 kg)    Not recorded      Body mass index is 33.51 kg/(m^2).  PHYSICAL EXAMNIATION:  Gen: NAD, conversant, well nourised, obese, well groomed                     Cardiovascular: Regular rate rhythm, no peripheral edema, warm, nontender. Eyes: Conjunctivae clear without exudates or hemorrhage Neck: Supple, no carotid bruise. Pulmonary: Clear to auscultation bilaterally   NEUROLOGICAL EXAM:  MENTAL STATUS: Speech:    Speech is normal; fluent and spontaneous with normal comprehension.  Cognition:     Orientation to time, place and person     Normal recent and remote memory     Normal Attention span and concentration     Normal Language, naming, repeating,spontaneous speech     Fund of knowledge   CRANIAL NERVES: CN II: Visual fields are full to confrontation. Fundoscopic exam is normal with sharp discs and no vascular changes. Pupils are round equal and briskly reactive to light. CN III, IV, VI: extraocular movement are normal. No ptosis. CN V: Facial sensation is intact to pinprick in all 3 divisions bilaterally. Corneal responses are intact.  CN VII: Face is symmetric with normal eye closure and smile. CN VIII: Hearing is normal to rubbing fingers CN IX, X: Palate elevates symmetrically. Phonation is normal. CN XI: Head turning and shoulder shrug are intact CN XII: Tongue is midline with normal movements and no atrophy.  MOTOR: There is no pronator drift of out-stretched arms. Muscle bulk and tone are normal. Muscle strength is normal.  REFLEXES: Reflexes are 2+ and symmetric at the biceps, triceps, knees, and ankles. Plantar responses are flexor.  SENSORY: Intact to light touch, pinprick, position sense, and vibration sense are intact in fingers and  toes.  COORDINATION: Rapid alternating movements and fine finger movements are intact. There is no dysmetria on finger-to-nose and heel-knee-shin.    GAIT/STANCE: Guarding his lumbar paraspinal muscles, cautious gait Romberg is absent.   DIAGNOSTIC DATA (LABS, IMAGING, TESTING) - I reviewed patient records, labs, notes, testing and imaging myself where available.   ASSESSMENT AND PLAN  ACIE CUSTIS is a 39 y.o. male   Seizure-like event, recurrent episode of mild confusion  Differentiation diagnosis includes seizure, medicine side effect, orthostatic blood pressure changes  He had normal MRI of the brain in December 15 2014  EEG  Document.this event  Return to clinic in 3-4 weeks    Levert Feinstein, M.D. Ph.D.  [facility] [address], [address] [place], [place] [address] Ph: [phone] Fax: [phone]  CC: Dr.  Marikay Alar

## 2015-01-02 ENCOUNTER — Ambulatory Visit (INDEPENDENT_AMBULATORY_CARE_PROVIDER_SITE_OTHER): Payer: BLUE CROSS/BLUE SHIELD | Admitting: Neurology

## 2015-01-02 ENCOUNTER — Telehealth: Payer: Self-pay | Admitting: *Deleted

## 2015-01-02 DIAGNOSIS — R410 Disorientation, unspecified: Secondary | ICD-10-CM | POA: Diagnosis not present

## 2015-01-02 NOTE — Telephone Encounter (Signed)
History:  Edyn Qazi is a 39 year old patient with an episode on 11/17/2014. The patient was trying to have a bowel movement, tried to stand up, and fell on his knee, had some jerking movements of the body, then went limp. The patient is being evaluated for possible seizure-type episode. The patient has had some events of mild confusion as well in August 2016.  This is a routine EEG. No skull defects are noted. Medications include diazepam and oxycodone.   EEG classification: Normal awake  Description of the recording: The background rhythms of this recording consists of a fairly well modulated medium amplitude alpha rhythm of 10 Hz that is reactive to eye opening and closure. As the record progresses, the patient appears to remain in the waking state throughout the recording. Photic stimulation was not performed. Hyperventilation was performed, resulting in a minimal buildup of the background rhythm activities without significant slowing seen. At no time during the recording does there appear to be evidence of spike or spike wave discharges or evidence of focal slowing. EKG monitor shows no evidence of cardiac rhythm abnormalities with a heart rate of 72.  Impression: This is a normal EEG recording in the waking state. No evidence of ictal or interictal discharges are seen.

## 2015-01-02 NOTE — Procedures (Signed)
History:  Eduardo Barnes is a 38-year-old patient with an episode on 11/17/2014. The patient was trying to have a bowel movement, tried to stand up, and fell on his knee, had some jerking movements of the body, then went limp. The patient is being evaluated for possible seizure-type episode. The patient has had some events of mild confusion as well in August 2016.  This is a routine EEG. No skull defects are noted. Medications include diazepam and oxycodone.   EEG classification: Normal awake  Description of the recording: The background rhythms of this recording consists of a fairly well modulated medium amplitude alpha rhythm of 10 Hz that is reactive to eye opening and closure. As the record progresses, the patient appears to remain in the waking state throughout the recording. Photic stimulation was not performed. Hyperventilation was performed, resulting in a minimal buildup of the background rhythm activities without significant slowing seen. At no time during the recording does there appear to be evidence of spike or spike wave discharges or evidence of focal slowing. EKG monitor shows no evidence of cardiac rhythm abnormalities with a heart rate of 72.  Impression: This is a normal EEG recording in the waking state. No evidence of ictal or interictal discharges are seen.      History:  Eduardo Barnes is a 38 year old patient with an episode on 11/17/2014. The patient was trying to have a bowel movement, tried to stand up, and fell on his knee, had some jerking movements of the body, then went limp. The patient is being evaluated for possible seizure-type episode. The patient has had some events of mild confusion as well in August 2016.  This is a routine EEG. No skull defects are noted. Medications include diazepam and oxycodone.   EEG classification: Normal awake  Description of the recording: The background rhythms of this recording consists of a fairly well modulated medium amplitude alpha rhythm of 10 Hz that is reactive to eye opening and closure. As the record progresses, the patient appears to remain in the waking state throughout the recording. Photic stimulation was not performed. Hyperventilation was performed, resulting in a minimal buildup of the background rhythm activities without significant slowing seen. At no time during the recording does there appear to be evidence of spike or spike wave discharges or evidence of focal slowing. EKG monitor shows no evidence of cardiac rhythm abnormalities with a heart rate of 72.  Impression: This is a normal EEG recording in the waking state. No evidence of ictal or interictal discharges are seen.

## 2015-01-02 NOTE — Telephone Encounter (Signed)
Attempted to contact patient with results.  I received a message stating his number has been changed and the new number is unknown.  Unable to reach.  He has a follow up appointment on 9/20.16.  If he calls our office, we will need to get a good contact number for him.

## 2015-01-22 ENCOUNTER — Ambulatory Visit: Payer: BLUE CROSS/BLUE SHIELD | Admitting: Neurology

## 2015-01-22 ENCOUNTER — Telehealth: Payer: Self-pay | Admitting: *Deleted

## 2015-01-22 NOTE — Telephone Encounter (Signed)
No showed follow up appt. 

## 2015-01-23 ENCOUNTER — Encounter: Payer: Self-pay | Admitting: Neurology

## 2015-04-04 DIAGNOSIS — K5792 Diverticulitis of intestine, part unspecified, without perforation or abscess without bleeding: Secondary | ICD-10-CM

## 2015-04-04 HISTORY — DX: Diverticulitis of intestine, part unspecified, without perforation or abscess without bleeding: K57.92

## 2015-04-08 ENCOUNTER — Emergency Department (HOSPITAL_COMMUNITY)
Admission: EM | Admit: 2015-04-08 | Discharge: 2015-04-08 | Disposition: A | Discharge status: Home / Self Care | Payer: BLUE CROSS/BLUE SHIELD | Attending: Emergency Medicine | Admitting: Emergency Medicine

## 2015-04-08 ENCOUNTER — Emergency Department (HOSPITAL_COMMUNITY): Payer: BLUE CROSS/BLUE SHIELD

## 2015-04-08 ENCOUNTER — Encounter (HOSPITAL_COMMUNITY): Payer: Self-pay | Admitting: Emergency Medicine

## 2015-04-08 DIAGNOSIS — R1030 Lower abdominal pain, unspecified: Secondary | ICD-10-CM | POA: Diagnosis present

## 2015-04-08 DIAGNOSIS — G8929 Other chronic pain: Secondary | ICD-10-CM | POA: Insufficient documentation

## 2015-04-08 DIAGNOSIS — M1990 Unspecified osteoarthritis, unspecified site: Secondary | ICD-10-CM | POA: Insufficient documentation

## 2015-04-08 DIAGNOSIS — L84 Corns and callosities: Secondary | ICD-10-CM | POA: Insufficient documentation

## 2015-04-08 DIAGNOSIS — K5732 Diverticulitis of large intestine without perforation or abscess without bleeding: Secondary | ICD-10-CM | POA: Insufficient documentation

## 2015-04-08 DIAGNOSIS — F17210 Nicotine dependence, cigarettes, uncomplicated: Secondary | ICD-10-CM | POA: Insufficient documentation

## 2015-04-08 LAB — CBC WITH DIFFERENTIAL/PLATELET
Basophils Absolute: 0.1 10*3/uL (ref 0.0–0.1)
Basophils Relative: 0 %
EOS PCT: 1 %
Eosinophils Absolute: 0.1 10*3/uL (ref 0.0–0.7)
HCT: 44.9 % (ref 39.0–52.0)
HEMOGLOBIN: 15.7 g/dL (ref 13.0–17.0)
LYMPHS ABS: 3.6 10*3/uL (ref 0.7–4.0)
LYMPHS PCT: 26 %
MCH: 30.8 pg (ref 26.0–34.0)
MCHC: 35 g/dL (ref 30.0–36.0)
MCV: 88 fL (ref 78.0–100.0)
MONO ABS: 1 10*3/uL (ref 0.1–1.0)
Monocytes Relative: 7 %
Neutro Abs: 9.1 10*3/uL — ABNORMAL HIGH (ref 1.7–7.7)
Neutrophils Relative %: 66 %
Platelets: 322 10*3/uL (ref 150–400)
RBC: 5.1 MIL/uL (ref 4.22–5.81)
RDW: 14 % (ref 11.5–15.5)
WBC: 13.9 10*3/uL — ABNORMAL HIGH (ref 4.0–10.5)

## 2015-04-08 LAB — COMPREHENSIVE METABOLIC PANEL
ALK PHOS: 81 U/L (ref 38–126)
ALT: 19 U/L (ref 17–63)
ANION GAP: 8 (ref 5–15)
AST: 18 U/L (ref 15–41)
Albumin: 4.2 g/dL (ref 3.5–5.0)
BILIRUBIN TOTAL: 0.3 mg/dL (ref 0.3–1.2)
BUN: 14 mg/dL (ref 6–20)
CO2: 23 mmol/L (ref 22–32)
Calcium: 9.1 mg/dL (ref 8.9–10.3)
Chloride: 101 mmol/L (ref 101–111)
Creatinine, Ser: 0.91 mg/dL (ref 0.61–1.24)
GFR calc Af Amer: >60 mL/min (ref >60)
GFR calc non Af Amer: >60 mL/min (ref >60)
Glucose, Bld: 80 mg/dL (ref 65–99)
POTASSIUM: 3.8 mmol/L (ref 3.5–5.1)
SODIUM: 132 mmol/L — AB (ref 135–145)
Total Protein: 7.5 g/dL (ref 6.5–8.1)

## 2015-04-08 LAB — URINALYSIS, ROUTINE W REFLEX MICROSCOPIC
Bilirubin Urine: NEGATIVE
Glucose, UA: NEGATIVE mg/dL
Hgb urine dipstick: NEGATIVE
KETONES UR: NEGATIVE mg/dL
LEUKOCYTES UA: NEGATIVE
NITRITE: NEGATIVE
PH: 5.5 (ref 5.0–8.0)
Protein, ur: NEGATIVE mg/dL
SPECIFIC GRAVITY, URINE: 1.02 (ref 1.005–1.030)

## 2015-04-08 LAB — LIPASE, BLOOD: LIPASE: 22 U/L (ref 11–51)

## 2015-04-08 LAB — ETHANOL: Alcohol, Ethyl (B): <5 mg/dL (ref <5)

## 2015-04-08 MED ORDER — METRONIDAZOLE 500 MG PO TABS: 500.0000 mg | ORAL_TABLET | Freq: Three times a day (TID) | ORAL | Status: DC

## 2015-04-08 MED ORDER — IOHEXOL 300 MG/ML  SOLN
100.0000 mL | Freq: Once | INTRAMUSCULAR | Status: AC | PRN
  Administered 2015-04-08: 100 mL via INTRAVENOUS

## 2015-04-08 MED ORDER — PROMETHAZINE HCL 25 MG PO TABS: 25.0000 mg | ORAL_TABLET | Freq: Four times a day (QID) | ORAL | Status: DC | PRN

## 2015-04-08 MED ORDER — HYDROCODONE-ACETAMINOPHEN 5-325 MG PO TABS: ORAL_TABLET | ORAL | Status: DC

## 2015-04-08 MED ORDER — CIPROFLOXACIN HCL 500 MG PO TABS
500.0000 mg | ORAL_TABLET | Freq: Two times a day (BID) | ORAL | Status: DC
Start: 2015-04-08 — End: 2015-04-17

## 2015-04-08 MED ORDER — CIPROFLOXACIN HCL 250 MG PO TABS
500.0000 mg | ORAL_TABLET | Freq: Once | ORAL | Status: AC
  Administered 2015-04-08: 500 mg via ORAL
  Filled 2015-04-08: qty 2

## 2015-04-08 MED ORDER — METRONIDAZOLE 500 MG PO TABS
500.0000 mg | ORAL_TABLET | Freq: Once | ORAL | Status: AC
  Administered 2015-04-08: 500 mg via ORAL
  Filled 2015-04-08: qty 1

## 2015-04-08 NOTE — ED Provider Notes (Signed)
CSN: [id number]     Arrival date & time 04/08/15  1623 History   First MD Initiated Contact with Patient 04/08/15 1814     Chief Complaint  Patient presents with  . Abdominal Pain  . Foot Pain      HPI Pt was seen at 1830.  Per pt, c/o gradual onset and persistence of constant lower abd "pain" for the past 2 to 3 days.  Has been associated with multiple intermittent episodes of diarrhea.  Describes the abd pain as "aching" and "cramping."  Describes the stools as "dark." Denies etoh abuse (quit 05/2014). Denies N/V, no fevers, no back pain, no rash, no CP/SOB, no blood in stools.       Past Medical History  Diagnosis Date  . DDD (degenerative disc disease), lumbar   . Arthritis     "probably a touch in my back" (11/15/2014)  . Chronic lower back pain   . Neck pain   . Seizure [facility](H[facility])    Past Surgical History  Procedure Laterality Date  . Tonsillectomy    . Anterior cervical decomp/discectomy fusion N/A 08/30/2014    Procedure: ANTERIOR CERVICAL DECOMPRESSION/DISCECTOMY FUSION CERVICAL FIVE-SIX,CERVICAL SIX-SEVEN;  Surgeon: Tia Alertavid S Jones, MD;  Location: [facility];  Service: Neurosurgery;  Laterality: N/A;  . Vascular surgery    . Posterior lumbar fusion  11/15/2014   Family History  Problem Relation Age of Onset  . Heart disease Mother   . Heart attack Father   . Seizures Mother   . Migraines Mother    Social History  Substance Use Topics  . Smoking status: Current Every Day Smoker -- 0.50 packs/day for 20 years    Types: Cigarettes  . Smokeless tobacco: Never Used     Comment: 11/15/2014 "down from 1 1/2 ppd in 08/2014"  . 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 ; ; Eyes: Negative for eye pain, redness and discharge. ; ; ENMT: Negative for ear pain, hoarseness, nasal congestion, sinus  pressure and sore throat. ; ; Cardiovascular: Negative for chest pain, palpitations, diaphoresis, dyspnea and peripheral edema. ; ; Respiratory: Negative for cough, wheezing and stridor. ; ; Gastrointestinal: +abd pain, "dark stools." Negative for nausea, vomiting, blood in stool, hematemesis, jaundice and rectal bleeding. . ; ; Genitourinary: Negative for dysuria, flank pain and hematuria. ; ; Genital:  No penile drainage or rash, no testicular pain or swelling, no scrotal rash or swelling. ;; Musculoskeletal: Negative for back pain and neck pain. Negative for swelling and trauma.; ; Skin: Negative for pruritus, rash, abrasions, blisters, bruising and skin lesion.; ; Neuro: Negative for headache, lightheadedness and neck stiffness. Negative for weakness, altered level of consciousness , altered mental status, extremity weakness, paresthesias, involuntary movement, seizure and syncope.      Allergies  Review of patient's allergies indicates no known allergies.  Home Medications   Prior to Admission medications   Medication Sig Start Date End Date Taking? Authorizing Provider  diazepam (VALIUM) 5 MG tablet TAKE 1 TABLET BY MOUTH EVERY 8 HOURS AS NEEDED FOR SPASMS 12/14/14   Historical Provider, MD  oxyCODONE-acetaminophen (PERCOCET) 10-325 MG per tablet Take 1 tablet by mouth every 4 (four) hours as needed for pain.    Historical Provider, MD   BP 129/67 mmHg  Pulse 69  Temp(Src) 97.5  F (36.4 C) (Oral)  Resp 14  Ht  (1.753 m)  Wt 230 lb (104.327 kg)  BMI 33.95 kg/m2  SpO2 96% Physical Exam 1835: Physical examination:  Nursing notes reviewed; Vital signs and O2 SAT reviewed;  Constitutional: Well developed, Well nourished, Well hydrated, In no acute distress; Head:  Normocephalic, atraumatic; Eyes: EOMI, PERRL, No scleral icterus; ENMT: Mouth and pharynx normal, Mucous membranes moist; Neck: Supple, Full range of motion, No lymphadenopathy; Cardiovascular: Regular rate and rhythm, No  gallop; Respiratory: Breath sounds clear & equal bilaterally, No wheezes.  Speaking full sentences with ease, Normal respiratory effort/excursion; Chest: Nontender, Movement normal; Abdomen: Soft, +RLQ and LLQ tenderness to palp. Nondistended, Normal bowel sounds. Rectal exam performed w/permission of pt and ED RN chaperone present.  Anal tone normal.  Non-tender, soft brown stool in rectal vault, heme neg.  No fissures, no external hemorrhoids, no palp masses.; Genitourinary: No CVA tenderness; Extremities: Pulses normal, No tenderness, No edema, No calf edema or asymmetry. +several callused areas on ball of right foot. No rash.; Neuro: AA&Ox3, Major CN grossly intact.  Speech clear. No gross focal motor or sensory deficits in extremities.; Skin: Color normal, Warm, Dry.   ED Course  Procedures (including critical care time) Labs Review   Imaging Review  I have personally reviewed and evaluated these images and lab results as part of my medical decision-making.   EKG Interpretation None      MDM  MDM Reviewed: previous chart, nursing note and vitals Reviewed previous: labs Interpretation: labs and CT scan      Results for orders placed or performed during the hospital encounter of 04/08/15  Comprehensive metabolic panel  Result Value Ref Range   Sodium 132 (L) 135 - 145 mmol/L   Potassium 3.8 3.5 - 5.1 mmol/L   Chloride 101 101 - 111 mmol/L   CO2 23 22 - 32 mmol/L   Glucose, Bld 80 65 - 99 mg/dL   BUN 14 6 - 20 mg/dL   Creatinine, Ser 1.61 0.61 - 1.24 mg/dL   Calcium 9.1 8.9 - 09.6 mg/dL   Total Protein 7.5 6.5 - 8.1 g/dL   Albumin 4.2 3.5 - 5.0 g/dL   AST 18 15 - 41 U/L   ALT 19 17 - 63 U/L   Alkaline Phosphatase 81 38 - 126 U/L   Total Bilirubin 0.3 0.3 - 1.2 mg/dL   GFR calc non Af Amer >60 >60 mL/min   GFR calc Af Amer >60 >60 mL/min   Anion gap 8 5 - 15  Lipase, blood  Result Value Ref Range   Lipase 22 11 - 51 U/L  CBC with Differential  Result Value Ref Range    WBC 13.9 (H) 4.0 - 10.5 K/uL   RBC 5.10 4.22 - 5.81 MIL/uL   Hemoglobin 15.7 13.0 - 17.0 g/dL   HCT 04.5 40.9 - 81.1 %   MCV 88.0 78.0 - 100.0 fL   MCH 30.8 26.0 - 34.0 pg   MCHC 35.0 30.0 - 36.0 g/dL   RDW 91.4 78.2 - 95.6 %   Platelets 322 150 - 400 K/uL   Neutrophils Relative % 66 %   Neutro Abs 9.1 (H) 1.7 - 7.7 K/uL   Lymphocytes Relative 26 %   Lymphs Abs 3.6 0.7 - 4.0 K/uL   Monocytes Relative 7 %   Monocytes Absolute 1.0 0.1 - 1.0 K/uL   Eosinophils Relative 1 %   Eosinophils Absolute 0.1 0.0 - 0.7 K/uL  Basophils Relative 0 %   Basophils Absolute 0.1 0.0 - 0.1 K/uL  Ethanol  Result Value Ref Range   Alcohol, Ethyl (B) <5 <5 mg/dL  Urinalysis, Routine w reflex microscopic  Result Value Ref Range   Color, Urine YELLOW YELLOW   APPearance CLEAR CLEAR   Specific Gravity, Urine 1.020 1.005 - 1.030   pH 5.5 5.0 - 8.0   Glucose, UA NEGATIVE NEGATIVE mg/dL   Hgb urine dipstick NEGATIVE NEGATIVE   Bilirubin Urine NEGATIVE NEGATIVE   Ketones, ur NEGATIVE NEGATIVE mg/dL   Protein, ur NEGATIVE NEGATIVE mg/dL   Nitrite NEGATIVE NEGATIVE   Leukocytes, UA NEGATIVE NEGATIVE   Ct Abdomen Pelvis W Contrast 04/08/2015  CLINICAL DATA:  Lower abdominal pain and cramping for 3 days. Melena. EXAM: CT ABDOMEN AND PELVIS WITH CONTRAST TECHNIQUE: Multidetector CT imaging of the abdomen and pelvis was performed using the standard protocol following bolus administration of intravenous contrast. CONTRAST:  OMNIPAQUE IOHEXOL 300 MG/ML  SOLN COMPARISON:  Noncontrast CT on 07/08/2014 FINDINGS: Lower chest:  No acute findings. Hepatobiliary: No masses or other significant abnormality. Gallbladder is unremarkable. Pancreas: No mass, inflammatory changes, or other significant abnormality. Spleen: Within normal limits in size and appearance. Adrenals/Urinary Tract: Adrenal glands are unremarkable. Small bilateral renal cysts again noted. No evidence of masses or hydronephrosis. Stomach/Bowel: No  evidence of obstruction, inflammatory process, or abnormal fluid collections. Mild sigmoid diverticulosis is demonstrated. There is mild wall thickening of proximal sigmoid colon with mild stranding in the adjacent pericolonic fat. This appears new and suspicious for mild diverticulitis. No evidence of abscess. Normal appendix visualized. Vascular/Lymphatic: No pathologically enlarged lymph nodes. No evidence of abdominal aortic aneurysm. Reproductive: No mass or other significant abnormality. Other: None. Musculoskeletal: No suspicious bone lesions identified. Lower lumbar spine fusion hardware noted. IMPRESSION: Findings suspicious for mild diverticulitis involving the proximal sigmoid colon. No evidence of abscess or other complication. Electronically Signed   By: Myles Rosenthal M.D.   On: 04/08/2015 20:45    2105:  CT with mild diverticulitis; tx abx, f/u GI MD. Calluses on foot; f/u Podiatry. Pt has tol PO well while in the ED without N/V.  No stooling while in the ED.  Abd benign, VSS. Feels better and wants to go home now. Dx and testing d/w pt and family.  Questions answered.  Verb understanding, agreeable to d/c home with outpt f/u.    Samuel Jester, DO 04/10/15 2147

## 2015-04-08 NOTE — ED Notes (Signed)
Having abdominal pain for last 2-3 days, rates pain 3/10.  History of ETOH, Quit Jan 11 th, 2015, says pain feels like it did when he was drinking volka heavy.  C/o burning cramping to lower abdomen, light headed, very soft stools (dark brown paste).  Also c/o sore to right foot.

## 2015-04-08 NOTE — Discharge Instructions (Signed)
°Emergency Department Resource Guide °1) Find a Doctor and Pay Out of Pocket °Although you won't have to find out who is covered by your insurance plan, it is a good idea to ask around and get recommendations. You will then need to call the office and see if the doctor you have chosen will accept you as a new patient and what types of options they offer for patients who are self-pay. Some doctors offer discounts or will set up payment plans for their patients who do not have insurance, but you will need to ask so you aren't surprised when you get to your appointment. ° °2) Contact Your Local Health Department °Not all health departments have doctors that can see patients for sick visits, but many do, so it is worth a call to see if yours does. If you don't know where your local health department is, you can check in your phone book. The CDC also has a tool to help you locate your state's health department, and many state websites also have listings of all of their local health departments. ° °3) Find a Walk-in Clinic °If your illness is not likely to be very severe or complicated, you may want to try a walk in clinic. These are popping up all over the country in pharmacies, drugstores, and shopping centers. They're usually staffed by nurse practitioners or physician assistants that have been trained to treat common illnesses and complaints. They're usually fairly quick and inexpensive. However, if you have serious medical issues or chronic medical problems, these are probably not your best option. ° °No Primary Care Doctor: °- Call [facility] at  832-8000 - they can help you locate a primary care doctor that  accepts your insurance, provides certain services, etc. °- Physician Referral Service- 1-800-533-3463 ° °Chronic Pain Problems: °Organization         Address  Phone   Notes  °[facility]  (336) 297-2271 Patients need to be referred by their primary care doctor.  ° °Medication  Assistance: °Organization         Address  Phone   Notes  °[place] [facility] [address] °[place], [place] [address] (336) 641-8030 --Must be a resident of [place] °-- Must have NO insurance coverage whatsoever (no Medicaid/ Medicare, etc.) °-- The pt. MUST have a primary care doctor that directs their care regularly and follows them in the community °  °[facility]  (866) 331-1348   °[employer]  (888) 892-1162   ° °Agencies that provide inexpensive medical care: °Organization         Address  Phone   Notes  °[facility]  (336) 832-8035   °[facility]    (336) 832-7272   °[facility] [address] °[place], [place] [address] (336) 832-4777   °[facility] [address], °[place] (336) 271-4999   °[facility]    (336) 373-0678   °[facility]    (336) 272-1050   °[facility] ° [address], [place] Phone:  (336) 832-4444, Fax:  (336) 832-4440 Hours of Operation:  9 am - 6 pm, M-F.  Also accepts Medicaid/Medicare and self-pay.  °[facility] ° [address], [place] Phone: (336) 832-3150, Fax: (336) [phone]. Hours of Operation:  8:30 am - 5:30 pm, M-F.  Also accepts Medicaid and self-pay.  °[facility] [place] [address], [place] Phone: (336) 878-6027   °[facility] [address], [place], [place] (336)723-1848, Ext. 123 Mondays & Thursdays: 7-9 AM.  First 15 patients are seen on a first come, first serve basis. °  ° °Medicaid-accepting [place] Providers: ° °Organization         Address  Phone   Notes  °[facility] [address], [place] (336) 641-2100 Also accepts self-pay patients.  °[facility] [address], [place] ° (336) 856-9996   °[facility] [address], [place]  (336) 288-8857   °[facility] [address] [place] [address], [place] (336) 299-7000   °Veita Bland [address], [place]  ° (336) 373-1557 Only accepts [place] Access Medicaid patients after they have their name applied to their card.  ° °Self-Pay (no insurance) in [place]: ° °Organization         Address  Phone   Notes  °Sickle Cell Patients, [facility] [address], [place] (336) 832-1970   °[facility] [address], [place] (336) 832-4400   °[facility] ° [address], [place] (336) 992-4800   °[facility]/Dr. Osei-Bonsu ° [address] [place] [address], [place] or [address], [place] (336) 841-8500 Phone number for both [place] and [place] locations is the same.  °[facility] [address], [place] (336) 299-0000   °[facility] [address] [place] [address], [place] or [address] (336) 852-7530 °(336) 878-2260   °[facility] [address], [place] (336) 350-1642, phone; (336) 294-5005, fax Sees patients 1st and 3rd Saturday of every month.  Must not qualify for public or private insurance (i.e. Medicaid, Medicare, [place] Health Choice, Veterans' Benefits) • Household income should be no more than 200% of the poverty level •The clinic cannot treat you if you are pregnant or think you are pregnant • Sexually transmitted diseases are not treated at the clinic.  ° ° °Dental Care: °Organization         Address  Phone  Notes  °[place] [facility] [address], [place] (336) 641-6152 Accepts children up to age 21 who are enrolled in Medicaid or [place] Health Choice; pregnant women with a Medicaid card; and children who have applied for Medicaid or [place] Health Choice, but were declined, whose parents can pay a reduced fee at time of service.  °[place]  [facility] [place]  [address], [place] (336) 641-7733 Accepts children up to age 21 who are enrolled in Medicaid or [place] Health Choice; pregnant women with a Medicaid card; and children who have applied for Medicaid or [place] Health Choice, but were declined, whose parents can pay a reduced fee at time of service.  °[facility] ° [address], [place] (336) 641-4533 Patients are seen by appointment only. Walk-ins are not accepted. [facility] will see patients 18 years of age and older. °Monday - Tuesday (8am-5pm) °Most Wednesdays (8:30-5pm) °$30 per visit, cash only  °[facility] ° [address], [place] (336) 641-4533 Patients are seen by appointment only. Walk-ins are not accepted. [facility] will see patients 18 years of age and older. °One   Wednesday Evening (Monthly: Volunteer Based).  $30 per visit, cash only  °[facility]  (919) 537-3737 for adults; Children under age 4, call Graduate Pediatric Dentistry at ([phone]) [phone]. Children aged 4-14, please call (919) [phone] to request a pediatric application. ° Dental services are provided in all areas of dental care including fillings, crowns and bridges, complete and partial dentures, implants, gum treatment, root canals, and extractions. Preventive care is also provided. Treatment is provided to both adults and children. °Patients are selected via a lottery and there is often a waiting list. °  °[facility] [address], °[place] ° (336) 763-8833 [email or web address] °  °[facility] [address], [place], [place] (336)723-1848, Ext. 123 Second and Fourth Thursday of each month, opens at 6:30 AM; Clinic ends at 9 AM.  Patients are seen on a first-come first-served basis, and a limited number are seen during each clinic.  ° °[facility] ° [address], [place], [place] (336) 723-7904    Eligibility Requirements °You must have lived in [place], [place], or [place] counties for at least the last three months. °  You cannot be eligible for state or federal sponsored healthcare insurance, including Veterans Administration, Medicaid, or Medicare. °  You generally cannot be eligible for healthcare insurance through your employer.  °  How to apply: °Eligibility screenings are held every Tuesday and Wednesday afternoon from 1:00 pm until 4:00 pm. You do not need an appointment for the interview!  °[facility] [address], [place], [place] 336-631-2330   °[facility]  336-342-8273   °[facility]  336-703-3100   °[facility]  336-570-6415   ° °Behavioral Health Resources in the Community: °Intensive Outpatient Programs °Organization         Address  Phone  Notes  °[facility] [address], [place], [place] 336-878-6098   °[facility] [address], [place], [place] 336-832-9800   °[facility] [address], [place], [place] ° 336-882-2125   °[facility] [address],  °[place], [place] 1-800-853-5163 or 336-641-4981   °Substance Abuse Resources °Organization         Address  Phone  Notes  °[facility]  336-882-2125   °[facility]  336-784-9470   °[facility]  336-285-9073   °[facility]  336-845-3988   °Residential & Outpatient Substance Abuse Program  1-800-659-3381   °Psychological Services °Organization         Address  Phone  Notes  °[facility]  336- 832-9600   °[facility]  336- 378-7881   °[facility] [address], [place] 1-800-853-5163 or 336-641-4981   ° °Mobile Crisis Teams °Organization         Address  Phone  Notes  °[facility], Mobile Crisis Care Unit  1-877-626-1772   °Assertive °[facility] ° [address]  [place], [place] 336-834-9664   °Sharon DeEsch [address] °[place] 336-554-5454   ° °Self-Help/Support Groups °Organization         Address  Phone             Notes  °[facility]  - variety of support groups  336- 373-1402 Call for more information  °Narcotics Anonymous (NA), [facility] [address], °[place]  2 meetings at this location  ° °  Residential Treatment Programs Organization         Address  Phone  Notes  [facility] [address],    [place]  [phone]   [facility]  [address], [place] [phone], [place], [place] [phone]) [phone]   [facility] [address], [place] [phone] Admissions: 8am-3pm M-F  [facility] [address]    [place], [place] [phone]   [facility] [address], [place], [place] [phone](516)849-2114   [facility] [address]  [place], [place] [phone]   [facility] Intensive Outpatient [address], [place], [place] [phone]   [facility] [address]  [place], [place] [phone] or [phone](351)[phone]   [facility] ([facility]) [address] [place], [place] [phone] Accepts Medicaid  [facility] [address]  [place] [phone] Substance Abuse/Addiction Treatment   [facility]         Address  Phone  Notes  [facility]  986-241-2908(888) 747-513-7604   Angie FavaJulie Brannon, PhD [address] [place], [place]   [phone](336) [phone] or [phone](336) [phone]   [facility]   [address] [place], [place] [phone](336) [phone]   [facility] [address], [place], [place] [phone](336) [phone] Insurance/Medicaid/sponsorship through [facility]  [facility] [address]                                    [place], [place] [phone](336) [phone] Therapy/tele-psych/case    [facility] [address]   [place], [place] [phone](336) [phone]    Dr. Lolly MustacheArfeen  [phone]) [phone]   [facility]  [employer] [facility] 1) [address],  2) [address], [place] 3)  [address], [place] [phone]) [phone](336) [phone]  [phone](336) [phone]   [facility] Child Abuse Hotline 306-372-6052(336) [phone] or [phone](336) [phone] (After Hours)      Take the prescriptions as directed. Increase the fiber in your diet.  Call the GI doctor tomorrow to schedule a follow up appointment within the week. Call the Podiatrist tomorrow to schedule a follow up appointment within the next week.  Return to the Emergency Department immediately sooner if worsening.

## 2015-04-09 ENCOUNTER — Telehealth: Payer: Self-pay | Admitting: Gastroenterology

## 2015-04-09 NOTE — Telephone Encounter (Signed)
Patient called this afternoon saying he had been seen in the ED recently and was told to call us to make follow up appointment. Please advise if we will accept him as a new patient. He does not have a PCP

## 2015-04-10 ENCOUNTER — Encounter: Payer: Self-pay | Admitting: Internal Medicine

## 2015-04-10 NOTE — Telephone Encounter (Signed)
Needs to be seen Monday or Tuesday of next week (prior to him completing antibiotics provided by ER). Currently I have an opening on Monday at 10.

## 2015-04-10 NOTE — Telephone Encounter (Signed)
I was unable to reach patient by phone or leave voice mail. I mailed him an appointment letter of his appointment date and time of 12/12 at 10am with LSL

## 2015-04-15 ENCOUNTER — Telehealth: Payer: Self-pay | Admitting: Gastroenterology

## 2015-04-15 ENCOUNTER — Encounter: Payer: Self-pay | Admitting: Gastroenterology

## 2015-04-15 ENCOUNTER — Ambulatory Visit: Payer: BLUE CROSS/BLUE SHIELD | Admitting: Gastroenterology

## 2015-04-15 NOTE — Telephone Encounter (Signed)
PATIENT WAS A NO SHOW AND LETTER SENT  °

## 2015-04-17 ENCOUNTER — Ambulatory Visit (INDEPENDENT_AMBULATORY_CARE_PROVIDER_SITE_OTHER): Payer: BLUE CROSS/BLUE SHIELD | Admitting: Gastroenterology

## 2015-04-17 ENCOUNTER — Encounter: Payer: Self-pay | Admitting: Gastroenterology

## 2015-04-17 ENCOUNTER — Other Ambulatory Visit: Payer: Self-pay

## 2015-04-17 VITALS — BP 135/84 | HR 91 | Temp 97.6°F | Ht 69.0 in | Wt 235.2 lb

## 2015-04-17 DIAGNOSIS — K5732 Diverticulitis of large intestine without perforation or abscess without bleeding: Secondary | ICD-10-CM | POA: Diagnosis not present

## 2015-04-17 MED ORDER — PEG 3350-KCL-NA BICARB-NACL 420 G PO SOLR: 4000.0000 mL | Freq: Once | ORAL | Status: DC

## 2015-04-17 NOTE — Patient Instructions (Addendum)
We have scheduled you for a colonoscopy with Dr. Jena Gaussourk. Follow the low-fiber diet below until you start feeling better. After that, you will advance to a high fiber diet, which is also attached below.  Call me if you have any worsening symptoms.     Low-Fiber Diet Fiber is found in fruits, vegetables, and whole grains. A low-fiber diet restricts fibrous foods that are not digested in the small intestine. A diet containing about 10-15 grams of fiber per day is considered low fiber. Low-fiber diets may be used to:  Promote healing and rest the bowel during intestinal flare-ups.  Prevent blockage of a partially obstructed or narrowed gastrointestinal tract.  Reduce fecal weight and volume.  Slow the movement of feces. You may be on a low-fiber diet as a transitional diet following surgery, after an injury (trauma), or because of a short (acute) or lifelong (chronic) illness. Your health care provider will determine the length of time you need to stay on this diet.  WHAT DO I NEED TO KNOW ABOUT A LOW-FIBER DIET? Always check the fiber content on the packaging's Nutrition Facts label, especially on foods from the grains list. Ask your dietitian if you have questions about specific foods that are related to your condition, especially if the food is not listed below. In general, a low-fiber food will have less than 2 g of fiber. WHAT FOODS CAN I EAT? Grains All breads and crackers made with white flour. Sweet rolls, doughnuts, waffles, pancakes, [place] toast, bagels. Pretzels, Melba toast, zwieback. Well-cooked cereals, such as cornmeal, farina, or cream cereals. Dry cereals that do not contain whole grains, fruit, or nuts, such as refined corn, wheat, rice, and oat cereals. Potatoes prepared any way without skins, plain pastas and noodles, refined white rice. Use white flour for baking and making sauces. Use allowed list of grains for casseroles, dumplings, and puddings.  Vegetables Strained  tomato and vegetable juices. Fresh lettuce, cucumber, spinach. Well-cooked (no skin or pulp) or canned vegetables, such as asparagus, bean sprouts, beets, carrots, green beans, mushrooms, potatoes, pumpkin, spinach, yellow squash, tomato sauce/puree, turnips, yams, and zucchini. Keep servings limited to  cup.  Fruits All fruit juices except prune juice. Cooked or canned fruits without skin and seeds, such as applesauce, apricots, cherries, fruit cocktail, grapefruit, grapes, mandarin oranges, melons, peaches, pears, pineapple, and plums. Fresh fruits without skin, such as apricots, avocados, bananas, melons, pineapple, nectarines, and peaches. Keep servings limited to  cup or 1 piece.  Meat and Other Protein Sources Ground or well-cooked tender beef, ham, veal, lamb, pork, or poultry. Eggs, plain cheese. Fish, oysters, shrimp, lobster, and other seafood. Liver, organ meats. Smooth nut butters. Dairy All milk products and alternative dairy substitutes, such as soy, rice, almond, and coconut, not containing added whole nuts, seeds, or added fruit. Beverages Decaf coffee, fruit, and vegetable juices or smoothies (small amounts, with no pulp or skins, and with fruits from allowed list), sports drinks, herbal tea. Condiments Ketchup, mustard, vinegar, cream sauce, cheese sauce, cocoa powder. Spices in moderation, such as allspice, basil, bay leaves, celery powder or leaves, cinnamon, cumin powder, curry powder, ginger, mace, marjoram, onion or garlic powder, oregano, paprika, parsley flakes, ground pepper, rosemary, sage, savory, tarragon, thyme, and turmeric. Sweets and Desserts Plain cakes and cookies, pie made with allowed fruit, pudding, custard, cream pie. Gelatin, fruit, ice, sherbet, frozen ice pops. Ice cream, ice milk without nuts. Plain hard candy, honey, jelly, molasses, syrup, sugar, chocolate syrup, gumdrops, marshmallows. Limit overall sugar  intake.  Fats and Oil Margarine, butter, cream,  mayonnaise, salad oils, plain salad dressings made from allowed foods. Choose healthy fats such as olive oil, canola oil, and omega-3 fatty acids (such as found in salmon or tuna) when possible.  Other Bouillon, broth, or cream soups made from allowed foods. Any strained soup. Casseroles or mixed dishes made with allowed foods. The items listed above may not be a complete list of recommended foods or beverages. Contact your dietitian for more options.  WHAT FOODS ARE NOT RECOMMENDED? Grains All whole wheat and whole grain breads and crackers. Multigrains, rye, bran seeds, nuts, or coconut. Cereals containing whole grains, multigrains, bran, coconut, nuts, raisins. Cooked or dry oatmeal, steel-cut oats. Coarse wheat cereals, granola. Cereals advertised as high fiber. Potato skins. Whole grain pasta, wild or brown rice. Popcorn. Coconut flour. Bran, buckwheat, corn bread, multigrains, rye, wheat germ.  Vegetables Fresh, cooked or canned vegetables, such as artichokes, asparagus, beet greens, broccoli, Brussels sprouts, cabbage, celery, cauliflower, corn, eggplant, kale, legumes or beans, okra, peas, and tomatoes. Avoid large servings of any vegetables, especially raw vegetables.  Fruits Fresh fruits, such as apples with or without skin, berries, cherries, figs, grapes, grapefruit, guavas, kiwis, mangoes, oranges, papayas, pears, persimmons, pineapple, and pomegranate. Prune juice and juices with pulp, stewed or dried prunes. Dried fruits, dates, raisins. Fruit seeds or skins. Avoid large servings of all fresh fruits. Meats and Other Protein Sources Tough, fibrous meats with gristle. Chunky nut butter. Cheese made with seeds, nuts, or other foods not recommended. Nuts, seeds, legumes (beans, including baked beans), dried peas, beans, lentils.  Dairy Yogurt or cheese that contains nuts, seeds, or added fruit.  Beverages Fruit juices with high pulp, prune juice. Caffeinated coffee and teas.   Condiments Coconut, maple syrup, pickles, olives. Sweets and Desserts Desserts, cookies, or candies that contain nuts or coconut, chunky peanut butter, dried fruits. Jams, preserves with seeds, marmalade. Large amounts of sugar and sweets. Any other dessert made with fruits from the not recommended list.  Other Soups made from vegetables that are not recommended or that contain other foods not recommended.  The items listed above may not be a complete list of foods and beverages to avoid. Contact your dietitian for more information.   This information is not intended to replace advice given to you by your health care provider. Make sure you discuss any questions you have with your health care provider.   Document Released: 10/10/2001 Document Revised: 04/25/2013 Document Reviewed: 03/13/2013 Elsevier Interactive Patient Education 2016 Elsevier Inc. High-Fiber Diet Fiber, also called dietary fiber, is a type of carbohydrate found in fruits, vegetables, whole grains, and beans. A high-fiber diet can have many health benefits. Your health care provider may recommend a high-fiber diet to help:  Prevent constipation. Fiber can make your bowel movements more regular.  Lower your cholesterol.  Relieve hemorrhoids, uncomplicated diverticulosis, or irritable bowel syndrome.  Prevent overeating as part of a weight-loss plan.  Prevent heart disease, type 2 diabetes, and certain cancers. WHAT IS MY PLAN? The recommended daily intake of fiber includes:  38 grams for men under age 5.  30 grams for men over age 80.  25 grams for women under age 71.  21 grams for women over age 13. You can get the recommended daily intake of dietary fiber by eating a variety of fruits, vegetables, grains, and beans. Your health care provider may also recommend a fiber supplement if it is not possible to get enough fiber through  your diet. WHAT DO I NEED TO KNOW ABOUT A HIGH-FIBER DIET?  Fiber supplements  have not been widely studied for their effectiveness, so it is better to get fiber through food sources.  Always check the fiber content on thenutrition facts label of any prepackaged food. Look for foods that contain at least 5 grams of fiber per serving.  Ask your dietitian if you have questions about specific foods that are related to your condition, especially if those foods are not listed in the following section.  Increase your daily fiber consumption gradually. Increasing your intake of dietary fiber too quickly may cause bloating, cramping, or gas.  Drink plenty of water. Water helps you to digest fiber. WHAT FOODS CAN I EAT? Grains Whole-grain breads. Multigrain cereal. Oats and oatmeal. Brown rice. Barley. Bulgur wheat. Millet. Bran muffins. Popcorn. Rye wafer crackers. Vegetables Sweet potatoes. Spinach. Kale. Artichokes. Cabbage. Broccoli. Green peas. Carrots. Squash. Fruits Berries. Pears. Apples. Oranges. Avocados. Prunes and raisins. Dried figs. Meats and Other Protein Sources Navy, kidney, pinto, and soy beans. Split peas. Lentils. Nuts and seeds. Dairy Fiber-fortified yogurt. Beverages Fiber-fortified soy milk. Fiber-fortified orange juice. Other Fiber bars. The items listed above may not be a complete list of recommended foods or beverages. Contact your dietitian for more options. WHAT FOODS ARE NOT RECOMMENDED? Grains White bread. Pasta made with refined flour. White rice. Vegetables Fried potatoes. Canned vegetables. Well-cooked vegetables.  Fruits Fruit juice. Cooked, strained fruit. Meats and Other Protein Sources Fatty cuts of meat. Fried Environmental education officer or fried fish. Dairy Milk. Yogurt. Cream cheese. Sour cream. Beverages Soft drinks. Other Cakes and pastries. Butter and oils. The items listed above may not be a complete list of foods and beverages to avoid. Contact your dietitian for more information. WHAT ARE SOME TIPS FOR INCLUDING HIGH-FIBER FOODS IN MY  DIET?  Eat a wide variety of high-fiber foods.  Make sure that half of all grains consumed each day are whole grains.  Replace breads and cereals made from refined flour or white flour with whole-grain breads and cereals.  Replace white rice with brown rice, bulgur wheat, or millet.  Start the day with a breakfast that is high in fiber, such as a cereal that contains at least 5 grams of fiber per serving.  Use beans in place of meat in soups, salads, or pasta.  Eat high-fiber snacks, such as berries, raw vegetables, nuts, or popcorn.   This information is not intended to replace advice given to you by your health care provider. Make sure you discuss any questions you have with your health care provider.   Document Released: 04/20/2005 Document Revised: 05/11/2014 Document Reviewed: 10/03/2013 Elsevier Interactive Patient Education [employer]

## 2015-04-17 NOTE — Assessment & Plan Note (Signed)
39 year old male with recent episode of uncomplicated diverticulitis diagnosed in the ED, improving clinically with supportive measures and Cipro/Flagyl. Will complete antibiotics today. Still doesn't feel quite back to baseline. Discussed staying on a low residue diet for a few more days and then advancing to high fiber. Needs initial colonoscopy to evaluate for underlying etiology but likely this is straight-forward diverticulitis. Low-volume hematochezia noted in the past, likely benign in setting of baseline constipation.   Proceed with TCS with Dr. Jena Gaussourk in near future: the risks, benefits, and alternatives have been discussed with the patient in detail. The patient states understanding and desires to proceed. 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 He has been sober since Jan 2015 and was applauded on this.

## 2015-04-17 NOTE — Progress Notes (Signed)
Primary Care Physician:  No PCP Per Patient Primary Gastroenterologist:  Dr. Jena Gauss   Chief Complaint  Patient presents with  . Abdominal Pain  . Diarrhea  . Gas    HPI:   Eduardo Barnes is a 39 y.o. male presenting today at the request of the ED secondary to mild sigmoid colon diverticulitis.   States he had past history of ETOH use and would have "attacks" of lower abdominal pain/burning and attributed to drinking. Now thinks maybe he was having episodes of diverticulitis, as this pain was similar. Dark stool/brown during episodes. Heme negative stool in ED. Prescribed antibiotics through ED and almost done. Still with some cramping/gas. Finishes Cipro and Flagyl today. Still feels somewhat "disconnected" in his head but no fevers. Bowel movements "muddy" appearing. BM looks like "soft-serve", no diarrhea. Goes about twice per day. Scant paper hematochezia in past. Rare nausea, no vomiting. Appetite has decreased since episode of diverticulitis. Feels somewhat full all the time, gassy. Has had decreased energy for 2-3 years now. Prior to presentation trended more towards constipation and would strain occasionally.    Past Medical History  Diagnosis Date  . DDD (degenerative disc disease), lumbar   . Arthritis     "probably a touch in my back" (11/15/2014)  . Chronic lower back pain   . Neck pain   . Seizure (HCC)   . Diverticulitis Dec 2016    Past Surgical History  Procedure Laterality Date  . Tonsillectomy    . Anterior cervical decomp/discectomy fusion N/A 08/30/2014    Procedure: ANTERIOR CERVICAL DECOMPRESSION/DISCECTOMY FUSION CERVICAL FIVE-SIX,CERVICAL SIX-SEVEN;  Surgeon: Tia Alert, MD;  Location: [facility];  Service: Neurosurgery;  Laterality: N/A;  . Posterior lumbar fusion  11/15/2014    Current Outpatient Prescriptions  Medication Sig Dispense Refill  . oxyCODONE-acetaminophen (PERCOCET) 10-325 MG per tablet Take 1 tablet by mouth every 4 (four) hours as  needed for pain.     No current facility-administered medications for this visit.    Allergies as of 04/17/2015  . (No Known Allergies)    Family History  Problem Relation Age of Onset  . Heart disease Mother   . Heart attack Father   . Seizures Mother   . Migraines Mother   . Colon cancer Neg Hx     Thinks his Aunt may have been diagnosed but no first degree relatives    Social History   Social History  . Marital Status: Married    Spouse Name: N/A  . Number of Children: 0  . Years of Education: GED   Occupational History  . Unemployed    Social History Main Topics  . Smoking status: Current Every Day Smoker -- 0.50 packs/day for 20 years    Types: Cigarettes  . Smokeless tobacco: Never Used     Comment: 11/15/2014 "down from 1 1/2 ppd in 08/2014"  . 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 Drug Use: No  . Sexual Activity: Not Currently   Other Topics Concern  . Not on file   Social History Narrative   Lives at home with his wife and step-children.   Right-handed.   3/4 - 1 two liter of soda per day.    Review of Systems: Negative unless mentioned in HPI   Physical Exam: BP 135/84 mmHg  Pulse 91  Temp(Src) 97.6 F (  36.4 C) (Oral)  Ht 5\' 9"  (1.753 m)  Wt 235 lb 3.2 oz (106.686 kg)  BMI 34.72 kg/m2 General:   Alert and oriented. Pleasant and cooperative. Well-nourished and well-developed.  Head:  Normocephalic and atraumatic. Eyes:  Without icterus, sclera clear and conjunctiva pink.  Ears:  Normal auditory acuity. Lungs:  Clear to auscultation bilaterally. No wheezes, rales, or rhonchi. No distress.  Heart:  S1, S2 present without murmurs appreciated.  Abdomen:  +BS, soft, very mild discomfort with palpation lower abdomen and non-distended. No HSM noted. No guarding or rebound. No masses appreciated.  Rectal:  Deferred  Msk:  Symmetrical without gross  deformities. Normal posture. Extremities:  Without clubbing or edema. Neurologic:  Alert and  oriented x4;  grossly normal neurologically. Psych:  Alert and cooperative. Normal mood and affect.  Lab Results  Component Value Date   WBC 13.9* 04/08/2015   HGB 15.7 04/08/2015   HCT 44.9 04/08/2015   MCV 88.0 04/08/2015   PLT 322 04/08/2015   Lab Results  Component Value Date   ALT 19 04/08/2015   AST 18 04/08/2015   ALKPHOS 81 04/08/2015   BILITOT 0.3 04/08/2015

## 2015-04-18 NOTE — Progress Notes (Signed)
No pcp per patient 

## 2015-05-07 NOTE — Patient Instructions (Signed)
Eduardo Barnes  05/07/2015     @PREFPERIOPPHARMACY @   Your procedure is scheduled on 05/13/2015.  Report to [facility] at 9:00 A.M.  Call this number if you have problems the morning of surgery:  [phone]   Remember:  Do not eat food or drink liquids after midnight.  Take these medicines the morning of surgery with A SIP OF WATER Oxycodone if needed   Do not wear jewelry, make-up or nail polish.  Do not wear lotions, powders, or perfumes.  You may wear deodorant.  Do not shave 48 hours prior to surgery.  Men may shave face and neck.  Do not bring valuables to the hospital.  [facility] is not responsible for any belongings or valuables.  Contacts, dentures or bridgework may not be worn into surgery.  Leave your suitcase in the car.  After surgery it may be brought to your room.  For patients admitted to the hospital, discharge time will be determined by your treatment team.  Patients discharged the day of surgery will not be allowed to drive home.    Please read over the following fact sheets that you were given. Anesthesia Post-op Instructions     PATIENT INSTRUCTIONS POST-ANESTHESIA  IMMEDIATELY FOLLOWING SURGERY:  Do not drive or operate machinery for the first twenty four hours after surgery.  Do not make any important decisions for twenty four hours after surgery or while taking narcotic pain medications or sedatives.  If you develop intractable nausea and vomiting or a severe headache please notify your doctor immediately.  FOLLOW-UP:  Please make an appointment with your surgeon as instructed. You do not need to follow up with anesthesia unless specifically instructed to do so.  WOUND CARE INSTRUCTIONS (if applicable):  Keep a dry clean dressing on the anesthesia/puncture wound site if there is drainage.  Once the wound has quit draining you may leave it open to air.  Generally you should leave the bandage intact for twenty four hours unless there is drainage.  If  the epidural site drains for more than 36-48 hours please call the anesthesia department.  QUESTIONS?:  Please feel free to call your physician or the hospital operator if you have any questions, and they will be happy to assist you.      Colonoscopy A colonoscopy is an exam to look at the entire large intestine (colon). This exam can help find problems such as tumors, polyps, inflammation, and areas of bleeding. The exam takes about 1 hour.  LET [facility] CARE PROVIDER KNOW ABOUT:   Any allergies you have.  All medicines you are taking, including vitamins, herbs, eye drops, creams, and over-the-counter medicines.  Previous problems you or members of your family have had with the use of anesthetics.  Any blood disorders you have.  Previous surgeries you have had.  Medical conditions you have. RISKS AND COMPLICATIONS  Generally, this is a safe procedure. However, as with any procedure, complications can occur. Possible complications include:  Bleeding.  Tearing or rupture of the colon wall.  Reaction to medicines given during the exam.  Infection (rare). BEFORE THE PROCEDURE   Ask your health care provider about changing or stopping your regular medicines.  You may be prescribed an oral bowel prep. This involves drinking a large amount of medicated liquid, starting the day before your procedure. The liquid will cause you to have multiple loose stools until your stool is almost clear or light green. This cleans out your colon in  preparation for the procedure.  Do not eat or drink anything else once you have started the bowel prep, unless your health care provider tells you it is safe to do so.  Arrange for someone to drive you home after the procedure. PROCEDURE   You will be given medicine to help you relax (sedative).  You will lie on your side with your knees bent.  A long, flexible tube with a light and camera on the end (colonoscope) will be inserted through the  rectum and into the colon. The camera sends video back to a computer screen as it moves through the colon. The colonoscope also releases carbon dioxide gas to inflate the colon. This helps your health care provider see the area better.  During the exam, your health care provider may take a small tissue sample (biopsy) to be examined under a microscope if any abnormalities are found.  The exam is finished when the entire colon has been viewed. AFTER THE PROCEDURE   Do not drive for 24 hours after the exam.  You may have a small amount of blood in your stool.  You may pass moderate amounts of gas and have mild abdominal cramping or bloating. This is caused by the gas used to inflate your colon during the exam.  Ask when your test results will be ready and how you will get your results. Make sure you get your test results.   This information is not intended to replace advice given to you by your health care provider. Make sure you discuss any questions you have with your health care provider.   Document Released: 04/17/2000 Document Revised: 02/08/2013 Document Reviewed: 12/26/2012 Elsevier Interactive Patient Education [employer] Inc[employer]

## 2015-05-08 ENCOUNTER — Encounter (HOSPITAL_COMMUNITY): Payer: Self-pay

## 2015-05-08 ENCOUNTER — Encounter (HOSPITAL_COMMUNITY)
Admission: RE | Admit: 2015-05-08 | Discharge: 2015-05-08 | Disposition: A | Discharge status: Home / Self Care | Payer: BLUE CROSS/BLUE SHIELD | Source: Ambulatory Visit | Attending: Internal Medicine | Admitting: Internal Medicine

## 2015-05-08 DIAGNOSIS — Z01818 Encounter for other preprocedural examination: Secondary | ICD-10-CM | POA: Diagnosis not present

## 2015-05-08 LAB — CBC
HCT: 45.8 % (ref 39.0–52.0)
Hemoglobin: 15.8 g/dL (ref 13.0–17.0)
MCH: 30.6 pg (ref 26.0–34.0)
MCHC: 34.5 g/dL (ref 30.0–36.0)
MCV: 88.6 fL (ref 78.0–100.0)
PLATELETS: 298 10*3/uL (ref 150–400)
RBC: 5.17 MIL/uL (ref 4.22–5.81)
RDW: 13.9 % (ref 11.5–15.5)
WBC: 12.7 10*3/uL — ABNORMAL HIGH (ref 4.0–10.5)

## 2015-05-08 LAB — BASIC METABOLIC PANEL
Anion gap: 8 (ref 5–15)
BUN: 15 mg/dL (ref 6–20)
CO2: 25 mmol/L (ref 22–32)
CREATININE: 1.12 mg/dL (ref 0.61–1.24)
Calcium: 9.4 mg/dL (ref 8.9–10.3)
Chloride: 105 mmol/L (ref 101–111)
Glucose, Bld: 116 mg/dL — ABNORMAL HIGH (ref 65–99)
POTASSIUM: 4.3 mmol/L (ref 3.5–5.1)
SODIUM: 138 mmol/L (ref 135–145)

## 2015-05-13 ENCOUNTER — Encounter (HOSPITAL_COMMUNITY): Payer: Self-pay | Admitting: *Deleted

## 2015-05-13 ENCOUNTER — Ambulatory Visit (HOSPITAL_COMMUNITY)
Admission: RE | Admit: 2015-05-13 | Discharge: 2015-05-13 | Disposition: A | Discharge status: Home / Self Care | Payer: BLUE CROSS/BLUE SHIELD | Source: Ambulatory Visit | Attending: Internal Medicine | Admitting: Internal Medicine

## 2015-05-13 ENCOUNTER — Ambulatory Visit (HOSPITAL_COMMUNITY): Payer: BLUE CROSS/BLUE SHIELD | Admitting: Anesthesiology

## 2015-05-13 ENCOUNTER — Encounter (HOSPITAL_COMMUNITY)
Admission: RE | Disposition: A | Discharge status: Home / Self Care | Payer: Self-pay | Source: Ambulatory Visit | Attending: Internal Medicine

## 2015-05-13 DIAGNOSIS — M5136 Other intervertebral disc degeneration, lumbar region: Secondary | ICD-10-CM | POA: Insufficient documentation

## 2015-05-13 DIAGNOSIS — M545 Low back pain: Secondary | ICD-10-CM | POA: Diagnosis not present

## 2015-05-13 DIAGNOSIS — Z1211 Encounter for screening for malignant neoplasm of colon: Secondary | ICD-10-CM | POA: Insufficient documentation

## 2015-05-13 DIAGNOSIS — K5730 Diverticulosis of large intestine without perforation or abscess without bleeding: Secondary | ICD-10-CM | POA: Insufficient documentation

## 2015-05-13 DIAGNOSIS — R933 Abnormal findings on diagnostic imaging of other parts of digestive tract: Secondary | ICD-10-CM | POA: Insufficient documentation

## 2015-05-13 DIAGNOSIS — M1990 Unspecified osteoarthritis, unspecified site: Secondary | ICD-10-CM | POA: Diagnosis not present

## 2015-05-13 DIAGNOSIS — G8929 Other chronic pain: Secondary | ICD-10-CM | POA: Insufficient documentation

## 2015-05-13 HISTORY — PX: COLONOSCOPY WITH PROPOFOL: SHX5780

## 2015-05-13 SURGERY — COLONOSCOPY WITH PROPOFOL
Anesthesia: Monitor Anesthesia Care

## 2015-05-13 MED ORDER — PROPOFOL 500 MG/50ML IV EMUL
INTRAVENOUS | Status: DC | PRN
  Administered 2015-05-13: 150 ug/kg/min via INTRAVENOUS
  Administered 2015-05-13: 10:00:00 via INTRAVENOUS

## 2015-05-13 MED ORDER — MIDAZOLAM HCL 2 MG/2ML IJ SOLN
INTRAMUSCULAR | Status: AC
  Filled 2015-05-13: qty 2

## 2015-05-13 MED ORDER — MIDAZOLAM HCL 5 MG/5ML IJ SOLN
INTRAMUSCULAR | Status: DC | PRN
  Administered 2015-05-13: 2 mg via INTRAVENOUS

## 2015-05-13 MED ORDER — GLYCOPYRROLATE 0.2 MG/ML IJ SOLN
INTRAMUSCULAR | Status: AC
  Filled 2015-05-13: qty 1

## 2015-05-13 MED ORDER — FENTANYL CITRATE (PF) 100 MCG/2ML IJ SOLN
INTRAMUSCULAR | Status: AC
  Filled 2015-05-13: qty 2

## 2015-05-13 MED ORDER — GLYCOPYRROLATE 0.2 MG/ML IJ SOLN
0.2000 mg | Freq: Once | INTRAMUSCULAR | Status: AC
Start: 2015-05-13 — End: 2015-05-13
  Administered 2015-05-13: 0.2 mg via INTRAVENOUS

## 2015-05-13 MED ORDER — ONDANSETRON HCL 4 MG/2ML IJ SOLN
INTRAMUSCULAR | Status: AC
  Filled 2015-05-13: qty 2

## 2015-05-13 MED ORDER — FENTANYL CITRATE (PF) 100 MCG/2ML IJ SOLN
25.0000 ug | INTRAMUSCULAR | Status: AC
Start: 2015-05-13 — End: 2015-05-13
  Administered 2015-05-13 (×2): 25 ug via INTRAVENOUS

## 2015-05-13 MED ORDER — ONDANSETRON HCL 4 MG/2ML IJ SOLN
4.0000 mg | Freq: Once | INTRAMUSCULAR | Status: AC
  Administered 2015-05-13: 4 mg via INTRAVENOUS

## 2015-05-13 MED ORDER — MIDAZOLAM HCL 2 MG/2ML IJ SOLN
1.0000 mg | INTRAMUSCULAR | Status: DC | PRN
  Administered 2015-05-13: 2 mg via INTRAVENOUS

## 2015-05-13 MED ORDER — PROPOFOL 10 MG/ML IV BOLUS
INTRAVENOUS | Status: AC
Start: 2015-05-13 — End: 2015-05-13
  Filled 2015-05-13: qty 40

## 2015-05-13 MED ORDER — LACTATED RINGERS IV SOLN
INTRAVENOUS | Status: DC
  Administered 2015-05-13: 1000 mL via INTRAVENOUS

## 2015-05-13 MED ORDER — LIDOCAINE HCL (PF) 1 % IJ SOLN
INTRAMUSCULAR | Status: AC
  Filled 2015-05-13: qty 20

## 2015-05-13 MED ORDER — LIDOCAINE HCL (CARDIAC) 20 MG/ML IV SOLN
INTRAVENOUS | Status: DC | PRN
  Administered 2015-05-13: 10 mg via INTRATRACHEAL

## 2015-05-13 NOTE — Transfer of Care (Signed)
Immediate Anesthesia Transfer of Care Note  Patient: Eduardo Barnes  Procedure(s) Performed: Procedure(s) with comments: COLONOSCOPY WITH PROPOFOL (N/A) - 1030  Patient Location: PACU  Anesthesia Type:MAC  Level of Consciousness: awake  Airway & Oxygen Therapy: Patient Spontanous Breathing and Patient connected to face mask oxygen  Post-op Assessment: Report given to RN  Post vital signs: Reviewed and stable  Last Vitals:  Filed Vitals:   05/13/15 0930 05/13/15 0935  BP: 123/79 125/79  Temp:    Resp: 25 16    Complications: No apparent anesthesia complications

## 2015-05-13 NOTE — Anesthesia Preprocedure Evaluation (Signed)
Anesthesia Evaluation  Patient identified by MRN, date of birth, ID band Patient awake    Reviewed: Allergy & Precautions, NPO status , Patient's Chart, lab work & pertinent test results  Airway Mallampati: II  TM Distance: >3 FB Neck ROM: Full    Dental  (+) Dental Advisory Given, Edentulous Upper,    Pulmonary Current Smoker,    Pulmonary exam normal        Cardiovascular negative cardio ROS Normal cardiovascular exam     Neuro/Psych Seizures - (once),     GI/Hepatic   Endo/Other  Morbid obesity  Renal/GU      Musculoskeletal  (+) Arthritis ,   Abdominal   Peds  Hematology   Anesthesia Other Findings   Reproductive/Obstetrics                             Anesthesia Physical Anesthesia Plan  ASA: III  Anesthesia Plan: MAC   Post-op Pain Management:    Induction: Intravenous  Airway Management Planned: Simple Face Mask  Additional Equipment:   Intra-op Plan:   Post-operative Plan:   Informed Consent: I have reviewed the patients History and Physical, chart, labs and discussed the procedure including the risks, benefits and alternatives for the proposed anesthesia with the patient or authorized representative who has indicated his/her understanding and acceptance.     Plan Discussed with:   Anesthesia Plan Comments:         Anesthesia Quick Evaluation

## 2015-05-13 NOTE — H&P (View-Only) (Signed)
Primary Care Physician:  No PCP Per Patient Primary Gastroenterologist:  Dr. Jena Gaussourk   Chief Complaint  Patient presents with  . Abdominal Pain  . Diarrhea  . Gas    HPI:   Eduardo Barnes is a 40 y.o. male presenting today at the request of the ED secondary to mild sigmoid colon diverticulitis.   States he had past history of ETOH use and would have "attacks" of lower abdominal pain/burning and attributed to drinking. Now thinks maybe he was having episodes of diverticulitis, as this pain was similar. Dark stool/brown during episodes. Heme negative stool in ED. Prescribed antibiotics through ED and almost done. Still with some cramping/gas. Finishes Cipro and Flagyl today. Still feels somewhat "disconnected" in his head but no fevers. Bowel movements "muddy" appearing. BM looks like "soft-serve", no diarrhea. Goes about twice per day. Scant paper hematochezia in past. Rare nausea, no vomiting. Appetite has decreased since episode of diverticulitis. Feels somewhat full all the time, gassy. Has had decreased energy for 2-3 years now. Prior to presentation trended more towards constipation and would strain occasionally.    Past Medical History  Diagnosis Date  . DDD (degenerative disc disease), lumbar   . Arthritis     "probably a touch in my back" (11/15/2014)  . Chronic lower back pain   . Neck pain   . Seizure (HCC)   . Diverticulitis Dec 2016    Past Surgical History  Procedure Laterality Date  . Tonsillectomy    . Anterior cervical decomp/discectomy fusion N/A 08/30/2014    Procedure: ANTERIOR CERVICAL DECOMPRESSION/DISCECTOMY FUSION CERVICAL FIVE-SIX,CERVICAL SIX-SEVEN;  Surgeon: Tia Alertavid S Jones, MD;  Location: [facility];  Service: Neurosurgery;  Laterality: N/A;  . Posterior lumbar fusion  11/15/2014    Current Outpatient Prescriptions  Medication Sig Dispense Refill  . oxyCODONE-acetaminophen (PERCOCET) 10-325 MG per tablet Take 1 tablet by mouth every 4 (four) hours as  needed for pain.     No current facility-administered medications for this visit.    Allergies as of 04/17/2015  . (No Known Allergies)    Family History  Problem Relation Age of Onset  . Heart disease Mother   . Heart attack Father   . Seizures Mother   . Migraines Mother   . Colon cancer Neg Hx     Thinks his Aunt may have been diagnosed but no first degree relatives    Social History   Social History  . Marital Status: Married    Spouse Name: N/A  . Number of Children: 0  . Years of Education: GED   Occupational History  . Unemployed    Social History Main Topics  . Smoking status: Current Every Day Smoker -- 0.50 packs/day for 20 years    Types: Cigarettes  . Smokeless tobacco: Never Used     Comment: 11/15/2014 "down from 1 1/2 ppd in 08/2014"  . 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 Drug Use: No  . Sexual Activity: Not Currently   Other Topics Concern  . Not on file   Social History Narrative   Lives at home with his wife and step-children.   Right-handed.   3/4 - 1 two liter of soda per day.    Review of Systems: Negative unless mentioned in HPI   Physical Exam: BP 135/84 mmHg  Pulse 91  Temp(Src) 97.6 F (  36.4 C) (Oral)  Ht 5\' 9"  (1.753 m)  Wt 235 lb 3.2 oz (106.686 kg)  BMI 34.72 kg/m2 General:   Alert and oriented. Pleasant and cooperative. Well-nourished and well-developed.  Head:  Normocephalic and atraumatic. Eyes:  Without icterus, sclera clear and conjunctiva pink.  Ears:  Normal auditory acuity. Lungs:  Clear to auscultation bilaterally. No wheezes, rales, or rhonchi. No distress.  Heart:  S1, S2 present without murmurs appreciated.  Abdomen:  +BS, soft, very mild discomfort with palpation lower abdomen and non-distended. No HSM noted. No guarding or rebound. No masses appreciated.  Rectal:  Deferred  Msk:  Symmetrical without gross  deformities. Normal posture. Extremities:  Without clubbing or edema. Neurologic:  Alert and  oriented x4;  grossly normal neurologically. Psych:  Alert and cooperative. Normal mood and affect.  Lab Results  Component Value Date   WBC 13.9* 04/08/2015   HGB 15.7 04/08/2015   HCT 44.9 04/08/2015   MCV 88.0 04/08/2015   PLT 322 04/08/2015   Lab Results  Component Value Date   ALT 19 04/08/2015   AST 18 04/08/2015   ALKPHOS 81 04/08/2015   BILITOT 0.3 04/08/2015

## 2015-05-13 NOTE — Op Note (Signed)
[facility] [address] [place], [id number]   COLONOSCOPY PROCEDURE REPORT  PATIENT: Eduardo Barnes, Eduardo Barnes  MR#: [id number] BIRTHDATE: [birth year] , 39  yrs. old GENDER: male ENDOSCOPIST: R.  Roetta SessionsMichael Donyel Nester, MD FACP [facility] REFERRED XB:JYNWGBY:[place] [facility] PROCEDURE DATE:  05/13/2015 PROCEDURE:   Colonoscopy, diagnostic INDICATIONS:abnormal sigmoid colon on CT; recent diverticulitis. MEDICATIONS: Deep sedation per Dr.  [facility] ASA CLASS:       Class II  CONSENT: The risks, benefits, alternatives and imponderables including but not limited to bleeding, perforation as well as the possibility of a missed lesion have been reviewed.  The potential for biopsy, lesion removal, etc. have also been discussed. Questions have been answered.  All parties agreeable.  Please see the history and physical in the medical record for more information.  DESCRIPTION OF PROCEDURE:   After the risks benefits and alternatives of the procedure were thoroughly explained, informed consent was obtained.  The digital rectal exam revealed no abnormalities of the rectum.   The EC-[id number] ([id number]  endoscope was introduced through the anus and advanced to the cecum, which was identified by both the appendix and ileocecal valve. No adverse events experienced.   The quality of the prep was adequate  The instrument was then slowly withdrawn as the colon was fully examined. Estimated blood loss is zero unless otherwise noted in this procedure report.      COLON FINDINGS: Normal-appearing rectal mucosa.  Scattered sigmoid diverticula; the remainder of the colonic mucosa appeared normal. Retroflexion was performed. .  Withdrawal time=8 minutes 0 seconds.  The scope was withdrawn and the procedure completed. COMPLICATIONS: There were no immediate complications.  ENDOSCOPIC IMPRESSION: Sigmoid diverticulosis; otherwise normal colonoscopy. Clinically, patient recently experienced a bout of  diverticulitis. His abdominal pain has resolved.  RECOMMENDATIONS: Begin Benefiber 1 tablespoon twice daily. Return at  age 40 for screening colonoscopy  eSigned:  R. Roetta SessionsMichael Ruth Kovich, MD Jerrel IvoryFACP [facility] 05/13/2015 10:07 AM   cc:  CPT CODES: ICD CODES:  The ICD and CPT codes recommended by this software are interpretations from the data that the clinical staff has captured with the software.  The verification of the translation of this report to the ICD and CPT codes and modifiers is the sole responsibility of the health care institution and practicing physician where this report was generated.  [employer] will not be held responsible for the validity of the ICD and CPT codes included on this report.  AMA assumes no liability for data contained or not contained herein. CPT is a Publishing rights managerregistered trademark of the [employer].

## 2015-05-13 NOTE — Interval H&P Note (Signed)
History and Physical Interval Note:  05/13/2015 9:22 AM  Pollyann GlenSteven T Harms  has presented today for surgery, with the diagnosis of history of diverticulitis  The various methods of treatment have been discussed with the patient and family. After consideration of risks, benefits and other options for treatment, the patient has consented to  Procedure(s) with comments: COLONOSCOPY WITH PROPOFOL (N/A) - 1030 as a surgical intervention .  The patient's history has been reviewed, patient examined, no change in status, stable for surgery.  I have reviewed the patient's chart and labs.  Questions were answered to the patient's satisfaction.     Robert Rourk  Abdominal pain has almost resolved. Otherwise no change. Diagnostic colonoscopy per plan.   The risks, benefits, limitations, alternatives and imponderables have been reviewed with the patient. Questions have been answered. All parties are agreeable.

## 2015-05-13 NOTE — Discharge Instructions (Signed)
.Diverticulosis Diverticulosis is the condition that develops when small pouches (diverticula) form in the wall of your colon. Your colon, or large intestine, is where water is absorbed and stool is formed. The pouches form when the inside layer of your colon pushes through weak spots in the outer layers of your colon. CAUSES  No one knows exactly what causes diverticulosis. RISK FACTORS  Being older than 50. Your risk for this condition increases with age. Diverticulosis is rare in people younger than 40 years. By age 14, almost everyone has it.  Eating a low-fiber diet.  Being frequently constipated.  Being overweight.  Not getting enough exercise.  Smoking.  Taking over-the-counter pain medicines, like aspirin and ibuprofen. SYMPTOMS  Most people with diverticulosis do not have symptoms. DIAGNOSIS  Because diverticulosis often has no symptoms, health care providers often discover the condition during an exam for other colon problems. In many cases, a health care provider will diagnose diverticulosis while using a flexible scope to examine the colon (colonoscopy). TREATMENT  If you have never developed an infection related to diverticulosis, you may not need treatment. If you have had an infection before, treatment may include:  Eating more fruits, vegetables, and grains.  Taking a fiber supplement.  Taking a live bacteria supplement (probiotic).  Taking medicine to relax your colon. HOME CARE INSTRUCTIONS   Drink at least 6-8 glasses of water each day to prevent constipation.  Try not to strain when you have a bowel movement.  Keep all follow-up appointments. If you have had an infection before:  Increase the fiber in your diet as directed by your health care provider or dietitian.  Take a dietary fiber supplement if your health care provider approves.  Only take medicines as directed by your health care provider. SEEK MEDICAL CARE IF:   You have abdominal  pain.  You have bloating.  You have cramps.  You have not gone to the bathroom in 3 days. SEEK IMMEDIATE MEDICAL CARE IF:   Your pain gets worse.  Yourbloating becomes very bad.  You have a fever or chills, and your symptoms suddenly get worse.  You begin vomiting.  You have bowel movements that are bloody or black. MAKE SURE YOU:  Understand these instructions.  Will watch your condition.  Will get help right away if you are not doing well or get worse.   This information is not intended to replace advice given to you by your health care provider. Make sure you discuss any questions you have with your health care provider.   Document Released: 01/16/2004 Document Revised: 04/25/2013 Document Reviewed: 03/15/2013 Elsevier Interactive Patient Education 2016 Elsevier Inc.   High-Fiber Diet Fiber, also called dietary fiber, is a type of carbohydrate found in fruits, vegetables, whole grains, and beans. A high-fiber diet can have many health benefits. Your health care provider may recommend a high-fiber diet to help:  Prevent constipation. Fiber can make your bowel movements more regular.  Lower your cholesterol.  Relieve hemorrhoids, uncomplicated diverticulosis, or irritable bowel syndrome.  Prevent overeating as part of a weight-loss plan.  Prevent heart disease, type 2 diabetes, and certain cancers. WHAT IS MY PLAN? The recommended daily intake of fiber includes:  38 grams for men under age 71.  30 grams for men over age 17.  25 grams for women under age 95.  21 grams for women over age 34. You can get the recommended daily intake of dietary fiber by eating a variety of fruits, vegetables, grains, and  beans. Your health care provider may also recommend a fiber supplement if it is not possible to get enough fiber through your diet. WHAT DO I NEED TO KNOW ABOUT A HIGH-FIBER DIET?  Fiber supplements have not been widely studied for their effectiveness, so it is  better to get fiber through food sources.  Always check the fiber content on thenutrition facts label of any prepackaged food. Look for foods that contain at least 5 grams of fiber per serving.  Ask your dietitian if you have questions about specific foods that are related to your condition, especially if those foods are not listed in the following section.  Increase your daily fiber consumption gradually. Increasing your intake of dietary fiber too quickly may cause bloating, cramping, or gas.  Drink plenty of water. Water helps you to digest fiber. WHAT FOODS CAN I EAT? Grains Whole-grain breads. Multigrain cereal. Oats and oatmeal. Brown rice. Barley. Bulgur wheat. Millet. Bran muffins. Popcorn. Rye wafer crackers. Vegetables Sweet potatoes. Spinach. Kale. Artichokes. Cabbage. Broccoli. Green peas. Carrots. Squash. Fruits Berries. Pears. Apples. Oranges. Avocados. Prunes and raisins. Dried figs. Meats and Other Protein Sources Navy, kidney, pinto, and soy beans. Split peas. Lentils. Nuts and seeds. Dairy Fiber-fortified yogurt. Beverages Fiber-fortified soy milk. Fiber-fortified orange juice. Other Fiber bars. The items listed above may not be a complete list of recommended foods or beverages. Contact your dietitian for more options. WHAT FOODS ARE NOT RECOMMENDED? Grains White bread. Pasta made with refined flour. White rice. Vegetables Fried potatoes. Canned vegetables. Well-cooked vegetables.  Fruits Fruit juice. Cooked, strained fruit. Meats and Other Protein Sources Fatty cuts of meat. Fried Environmental education officerpoultry or fried fish. Dairy Milk. Yogurt. Cream cheese. Sour cream. Beverages Soft drinks. Other Cakes and pastries. Butter and oils. The items listed above may not be a complete list of foods and beverages to avoid. Contact your dietitian for more information. WHAT ARE SOME TIPS FOR INCLUDING HIGH-FIBER FOODS IN MY DIET?  Eat a wide variety of high-fiber foods.  Make sure  that half of all grains consumed each day are whole grains.  Replace breads and cereals made from refined flour or white flour with whole-grain breads and cereals.  Replace white rice with brown rice, bulgur wheat, or millet.  Start the day with a breakfast that is high in fiber, such as a cereal that contains at least 5 grams of fiber per serving.  Use beans in place of meat in soups, salads, or pasta.  Eat high-fiber snacks, such as berries, raw vegetables, nuts, or popcorn.   This information is not intended to replace advice given to you by your health care provider. Make sure you discuss any questions you have with your health care provider.   Document Released: 04/20/2005 Document Revised: 05/11/2014 Document Reviewed: 10/03/2013 Elsevier Interactive Patient Education 2016 Elsevier Inc.  Colonoscopy Discharge Instructions  Read the instructions outlined below and refer to this sheet in the next few weeks. These discharge instructions provide you with general information on caring for yourself after you leave the hospital. Your doctor may also give you specific instructions. While your treatment has been planned according to the most current medical practices available, unavoidable complications occasionally occur. If you have any problems or questions after discharge, call Dr. Jena Gaussourk at [phone]. ACTIVITY  You may resume your regular activity, but move at a slower pace for the next 24 hours.   Take frequent rest periods for the next 24 hours.   Walking will help get rid of the  air and reduce the bloated feeling in your belly (abdomen).   No driving for 24 hours (because of the medicine (anesthesia) used during the test).    Do not sign any important legal documents or operate any machinery for 24 hours (because of the anesthesia used during the test).  NUTRITION  Drink plenty of fluids.   You may resume your normal diet as instructed by your doctor.   Begin with a light  meal and progress to your normal diet. Heavy or fried foods are harder to digest and may make you feel sick to your stomach (nauseated).   Avoid alcoholic beverages for 24 hours or as instructed.  MEDICATIONS  You may resume your normal medications unless your doctor tells you otherwise.  WHAT YOU CAN EXPECT TODAY  Some feelings of bloating in the abdomen.   Passage of more gas than usual.   Spotting of blood in your stool or on the toilet paper.  IF YOU HAD POLYPS REMOVED DURING THE COLONOSCOPY:  No aspirin products for 7 days or as instructed.   No alcohol for 7 days or as instructed.   Eat a soft diet for the next 24 hours.  FINDING OUT THE RESULTS OF YOUR TEST Not all test results are available during your visit. If your test results are not back during the visit, make an appointment with your caregiver to find out the results. Do not assume everything is normal if you have not heard from your caregiver or the medical facility. It is important for you to follow up on all of your test results.  SEEK IMMEDIATE MEDICAL ATTENTION IF:  You have more than a spotting of blood in your stool.   Your belly is swollen (abdominal distention).   You are nauseated or vomiting.   You have a temperature over 101.   You have abdominal pain or discomfort that is severe or gets worse throughout the day.   Diverticulosis information provided  Begin Benefiber 1 tablespoon twice daily  Repeat colonoscopy at age 23 for colon cancer screening

## 2015-05-16 ENCOUNTER — Encounter (HOSPITAL_COMMUNITY): Payer: Self-pay | Admitting: Internal Medicine

## 2015-05-16 NOTE — Anesthesia Postprocedure Evaluation (Signed)
Anesthesia Post Note Late entry Patient: Eduardo Barnes  Procedure(s) Performed: Procedure(s) (LRB): COLONOSCOPY WITH PROPOFOL (N/A)  Patient location during evaluation: PACU Anesthesia Type: MAC Level of consciousness: awake and alert and oriented Pain management: pain level controlled Vital Signs Assessment: post-procedure vital signs reviewed and stable Respiratory status: spontaneous breathing Cardiovascular status: blood pressure returned to baseline Postop Assessment: no signs of nausea or vomiting Anesthetic complications: no    Last Vitals:  Filed Vitals:   05/13/15 1020 05/13/15 1032  BP:  105/68  Pulse: 81 78  Temp:  36.7 C  Resp: 17 18    Last Pain:  Filed Vitals:   05/15/15 0902  PainSc: 3                  Eduardo Barnes

## 2015-06-04 ENCOUNTER — Emergency Department (HOSPITAL_COMMUNITY): Payer: BLUE CROSS/BLUE SHIELD

## 2015-06-04 ENCOUNTER — Encounter (HOSPITAL_COMMUNITY): Payer: Self-pay | Admitting: Emergency Medicine

## 2015-06-04 ENCOUNTER — Emergency Department (HOSPITAL_COMMUNITY)
Admission: EM | Admit: 2015-06-04 | Discharge: 2015-06-04 | Disposition: A | Discharge status: Home / Self Care | Payer: BLUE CROSS/BLUE SHIELD | Attending: Emergency Medicine | Admitting: Emergency Medicine

## 2015-06-04 DIAGNOSIS — N50819 Testicular pain, unspecified: Secondary | ICD-10-CM | POA: Diagnosis not present

## 2015-06-04 DIAGNOSIS — M1990 Unspecified osteoarthritis, unspecified site: Secondary | ICD-10-CM | POA: Diagnosis not present

## 2015-06-04 DIAGNOSIS — G8929 Other chronic pain: Secondary | ICD-10-CM | POA: Diagnosis not present

## 2015-06-04 DIAGNOSIS — Z981 Arthrodesis status: Secondary | ICD-10-CM | POA: Insufficient documentation

## 2015-06-04 DIAGNOSIS — M62830 Muscle spasm of back: Secondary | ICD-10-CM | POA: Insufficient documentation

## 2015-06-04 DIAGNOSIS — M545 Low back pain, unspecified: Secondary | ICD-10-CM

## 2015-06-04 DIAGNOSIS — F17210 Nicotine dependence, cigarettes, uncomplicated: Secondary | ICD-10-CM | POA: Diagnosis not present

## 2015-06-04 DIAGNOSIS — R200 Anesthesia of skin: Secondary | ICD-10-CM | POA: Diagnosis not present

## 2015-06-04 DIAGNOSIS — Z8719 Personal history of other diseases of the digestive system: Secondary | ICD-10-CM | POA: Diagnosis not present

## 2015-06-04 MED ORDER — DIAZEPAM 5 MG PO TABS
5.0000 mg | ORAL_TABLET | Freq: Once | ORAL | Status: AC
  Administered 2015-06-04: 5 mg via ORAL
  Filled 2015-06-04: qty 1

## 2015-06-04 MED ORDER — OXYCODONE-ACETAMINOPHEN 5-325 MG PO TABS
1.0000 | ORAL_TABLET | Freq: Once | ORAL | Status: AC
  Administered 2015-06-04: 1 via ORAL
  Filled 2015-06-04: qty 1

## 2015-06-04 MED ORDER — CYCLOBENZAPRINE HCL 10 MG PO TABS: 10.0000 mg | ORAL_TABLET | Freq: Once | ORAL | Status: DC

## 2015-06-04 MED ORDER — DIAZEPAM 5 MG PO TABS: 5.0000 mg | ORAL_TABLET | Freq: Two times a day (BID) | ORAL | Status: DC

## 2015-06-04 MED ORDER — HYDROMORPHONE HCL 1 MG/ML IJ SOLN: 0.5000 mg | Freq: Once | INTRAMUSCULAR | Status: DC

## 2015-06-04 NOTE — ED Provider Notes (Signed)
CSN: [id number]     Arrival date & time 06/04/15  1920 History  By signing my name below, I, Tanda Rockers, attest that this documentation has been prepared under the direction and in the presence of Kerrie Buffalo, NP. Electronically Signed: Tanda Rockers, ED Scribe. 06/04/2015. 8:22 PM.   Chief Complaint  Patient presents with  . Back Pain   Patient is a 40 y.o. male presenting with back pain. The history is provided by the patient. No language interpreter was used.  Back Pain Location:  Lumbar spine Quality:  Burning Radiates to:  R posterior upper leg and L posterior upper leg Pain severity:  Moderate Onset quality:  Sudden Timing:  Constant Progression:  Worsening Chronicity:  Chronic Context comment:  Sneezing Ineffective treatments: Oycodone. Associated symptoms: leg pain and numbness   Associated symptoms: no bladder incontinence, no bowel incontinence, no paresthesias and no weakness      HPI Comments: KOI ZANGARA is a 40 y.o. male with hx DDD and chronic back pain s/p lumbar fusion who presents to the Emergency Department complaining of sudden onset, constant, worsening, burning, lower back pain radiating down bilateral lower legs that began earlier today. Pt had lumbar fusion in August (approximatley 6 months ago) and has been having chronic back pain since. He reports that he sneezed today, causing worsening back pain radiating down his BLEs which is new. He reports that the pain alternates between his right leg and left leg depending on which leg he places pressure on while walking. Pt takes 7.5 mg Oxycodone TID for his pain and took some earlier today without relief. He also reports intermittent testicular pain that was present prior to the surgery. Pt has had numbness in his 3rd-5th toes on right foot since surgery. He denies weakness, tingling, urinary or bowel incontinence, or any other associated symptoms.    Past Medical History  Diagnosis Date  . DDD (degenerative  disc disease), lumbar   . Arthritis     "probably a touch in my back" (11/15/2014)  . Chronic lower back pain   . Neck pain   . Diverticulitis Dec 2016  . Seizure (HCC)     had 1 seizure after back surgery from "too many muscle relaxers;.On no meds and no more seizure   Past Surgical History  Procedure Laterality Date  . Tonsillectomy    . Anterior cervical decomp/discectomy fusion N/A 08/30/2014    Procedure: ANTERIOR CERVICAL DECOMPRESSION/DISCECTOMY FUSION CERVICAL FIVE-SIX,CERVICAL SIX-SEVEN;  Surgeon: Tia Alert, MD;  Location: [facility];  Service: Neurosurgery;  Laterality: N/A;  . Posterior lumbar fusion  11/15/2014  . Colonoscopy with propofol N/A 05/13/2015    Procedure: COLONOSCOPY WITH PROPOFOL;  Surgeon: Corbin Ade, MD;  Location: [facility];  Service: Endoscopy;  Laterality: N/A;  1030   Family History  Problem Relation Age of Onset  . Heart disease Mother   . Heart attack Father   . Seizures Mother   . Migraines Mother   . Colon cancer Neg Hx     Thinks his Aunt may have been diagnosed but no first degree relatives   Social History  Substance Use Topics  . Smoking status: Current Every Day Smoker -- 0.00 packs/day for 0 years    Types: Cigarettes  . Smokeless tobacco: Never Used     Comment: 11/15/2014 "down from 1 1/2 ppd in 08/2014"  . Alcohol Use: 0.0 oz/week    0 Standard drinks or equivalent per week    Review  of Systems  Gastrointestinal: Negative for bowel incontinence.  Genitourinary: Negative for bladder incontinence.  Musculoskeletal: Positive for back pain.  Neurological: Positive for numbness. Negative for weakness and paresthesias.  All other systems reviewed and are negative.     Allergies  Flexeril  Home Medications   Prior to Admission medications   Medication Sig Start Date End Date Taking? Authorizing Provider  diazepam (VALIUM) 5 MG tablet Take 1 tablet (5 mg total) by mouth 2 (two) times daily. 06/04/15   Hope Orlene Och, NP   oxyCODONE-acetaminophen (PERCOCET) 10-325 MG per tablet Take 1 tablet by mouth every 4 (four) hours as needed for pain.    Historical Provider, MD  polyethylene glycol-electrolytes (NULYTELY/GOLYTELY) 420 G solution Take 4,000 mLs by mouth once. 04/17/15   Nira Retort, NP   BP 139/99 mmHg  Pulse 88  Temp(Src) 97.6 F (36.4 C) (Oral)  Resp 16  Ht  (1.753 m)  Wt 107.956 kg  BMI 35.13 kg/m2  SpO2 97%   Physical Exam  Constitutional: He is oriented to person, place, and time. He appears well-developed and well-nourished. No distress.  HENT:  Head: Normocephalic and atraumatic.  Eyes: Conjunctivae and EOM are normal.  Neck: Normal range of motion. Neck supple. No tracheal deviation present.  Cardiovascular: Normal rate and regular rhythm.   Pulmonary/Chest: Effort normal. No respiratory distress. He has no wheezes. He has no rales. He exhibits no tenderness.  Abdominal: Soft. Bowel sounds are normal. There is no tenderness.  Musculoskeletal: He exhibits tenderness. He exhibits no edema.       Lumbar back: He exhibits decreased range of motion (due to pain), tenderness and spasm. He exhibits no deformity and normal pulse.       Back:  No C or T spine tenderness Tenderness to L spine   Neurological: He is alert and oriented to person, place, and time. He has normal strength. No cranial nerve deficit or sensory deficit. Gait normal.  Reflex Scores:      Bicep reflexes are 2+ on the right side and 2+ on the left side.      Brachioradialis reflexes are 2+ on the right side and 2+ on the left side.      Patellar reflexes are 2+ on the right side and 2+ on the left side. Distal pulses are 2+ Grips are equal Radial pulses are 2+ Adequate circulation  Skin: Skin is warm and dry.  Psychiatric: He has a normal mood and affect. His behavior is normal.  Nursing note and vitals reviewed.   ED Course  Procedures (including critical care time) Mr Lumbar Spine Wo Contrast  06/04/2015   CLINICAL DATA:  Acute onset constant burning worsening low back pain radiating to bilateral lower extremities beginning earlier today. Status post lumbar fusion 6 months ago with chronic back pain. Chronic intermittent testicular pain and RIGHT lateral foot numbness since surgery. History of seizures. EXAM: MRI LUMBAR SPINE WITHOUT CONTRAST TECHNIQUE: Multiplanar, multisequence MR imaging of the lumbar spine was performed. No intravenous contrast was administered. COMPARISON:  CT abdomen and pelvis December 5th 2016 FINDINGS: Status post L5-S1 laminectomies and undersurface L4-5 laminotomies. L4 through S1 pedicle screws and bridging rods resulting in susceptibility artifact. Stable grade 2 (8 mm) L5-S1 anterolisthesis with interbody fusion material. The remaining lumbar vertebral body disc heights maintained, with decreased T2 signal within the L4-5 disc compatible with mild desiccation. L1 superior endplate Schmorl's node. No STIR signal abnormality to suggest fracture. Multiple cysts in the kidney better  characterized on CT abdomen and pelvis April 08, 2015. Moderate paraspinal muscle atrophy at and below the level of surgical intervention. Minimal seroma within the paraspinal surgical bed at L5-S1. Level by level evaluation: L1-2 and L2-3: No disc bulge, canal stenosis nor neural foraminal narrowing. L3-4: No disc bulge. Mild facet arthropathy and ligamentum flavum redundancy with trace facet effusions which are likely reactive. No canal stenosis or neural foraminal narrowing. L4-5: Central annular fissure, less likely discectomy. No significant disc bulge. Status post apparent undersurface laminotomies without canal stenosis or neural foraminal narrowing. L5-S1: Status post laminectomies. No canal stenosis. No neural foraminal narrowing. IMPRESSION: Status post L5-S1 PLIF with L4-5 and L5-S1 posterior decompression. Stable grade 2 L5-S1 anterolisthesis. Central L4-5 annular fissure, less likely discectomy. No  canal stenosis or neural foraminal narrowing. Electronically Signed   By: Awilda Metro M.D.   On: 06/04/2015 21:57     DIAGNOSTIC STUDIES: Oxygen Saturation is 97% on RA, normal by my interpretation.    COORDINATION OF CARE: 8:11 PM-Discussed treatment plan which includes MRI Lumbar spine with pt at bedside and pt agreed to plan.   Dr. Fayrene Fearing reviewed MRI, will give patient Rx for Valium for muscle spasm and patient will continue his Percocet as directed. He will follow up with his doctor as scheduled.   MDM  40 y.o. male with low back pain that started suddenly after he sneezed and felt a pop in the lower back. Stable for d/c without new findings on MRI. Discussed with the patient and all questioned fully answered. He will return if any problems arise.   Final diagnoses:  Severe lumbar pain    I personally performed the services described in this documentation, which was scribed in my presence. The recorded information has been reviewed and is accurate.      [place], [place] 06/04/15 2219  Rolland Porter, MD 06/08/15 [phone]

## 2015-06-04 NOTE — ED Notes (Addendum)
Pt. reports chronic low back pain worse today after sneezing , pt. stated " I felt a pop " , pain radiating to both legs and hips / increases with movement .

## 2015-06-04 NOTE — ED Notes (Signed)
Patient transported to MRI 

## 2015-06-04 NOTE — Discharge Instructions (Signed)

## 2016-03-30 IMAGING — MR MR LUMBAR SPINE W/O CM
4 of 5 series · 18 of 48 positions shown · non-contrast
Comparison: CT abdomen and pelvis Wednesday April, 2015

CLINICAL DATA: Acute onset constant burning worsening low back pain
radiating to bilateral lower extremities beginning earlier today.
Status post lumbar fusion 6 months ago with chronic back pain.
Chronic intermittent testicular pain and RIGHT lateral foot numbness
since surgery. History of seizures.

EXAM:
MRI LUMBAR SPINE WITHOUT CONTRAST
TECHNIQUE: Multiplanar, multisequence MR imaging of the lumbar spine was
performed. No intravenous contrast was administered.

[Series 3: T2 · sagittal · 4.0mm · 0.55mm/px · 4 of 12 slices shown (1 of 2)]
[im 1/12]
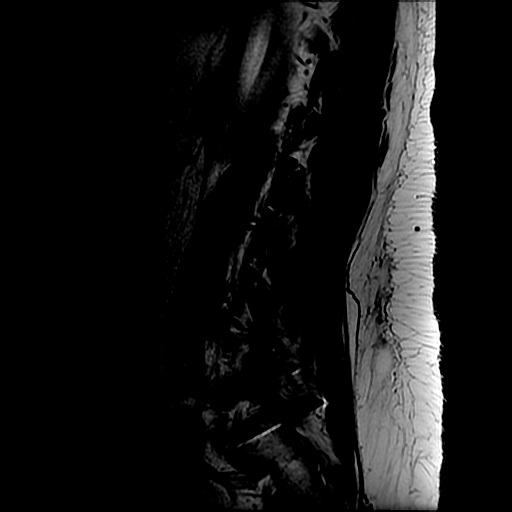
[im 4/12]
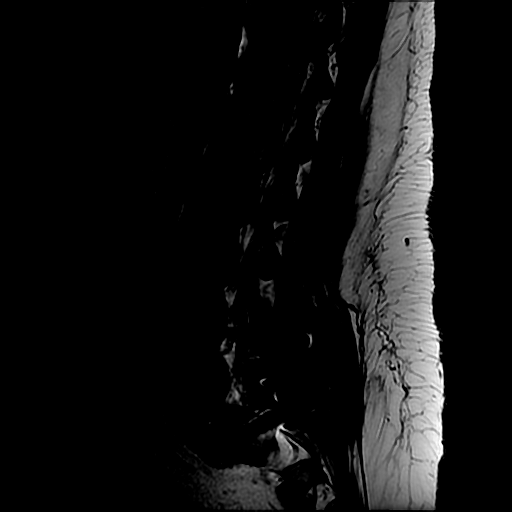
[im 8/12]
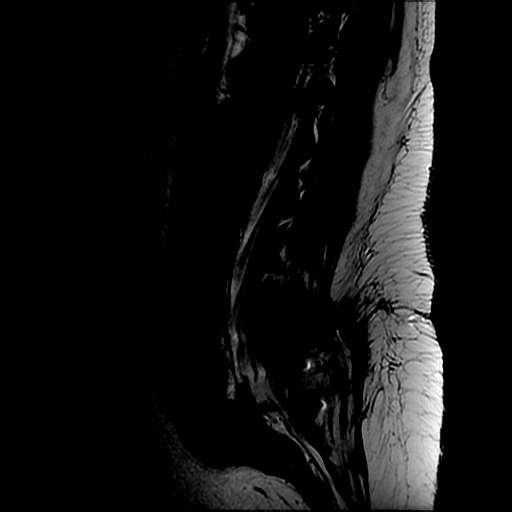
[im 12/12]
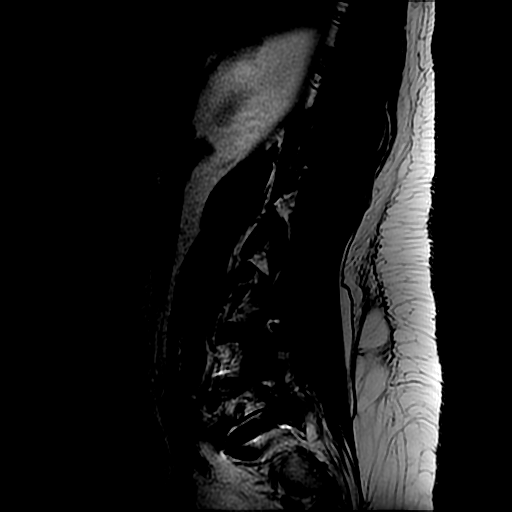

[Series 4: T1 · sagittal · 4.0mm · 0.55mm/px · 3 of 12 slices shown (1 of 2)]
[im 1/12]
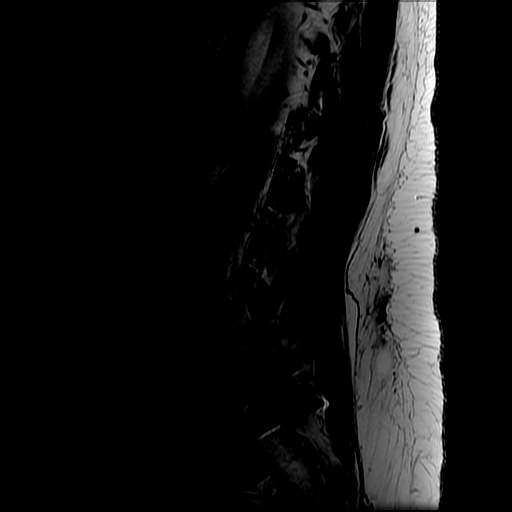
[im 6/12]
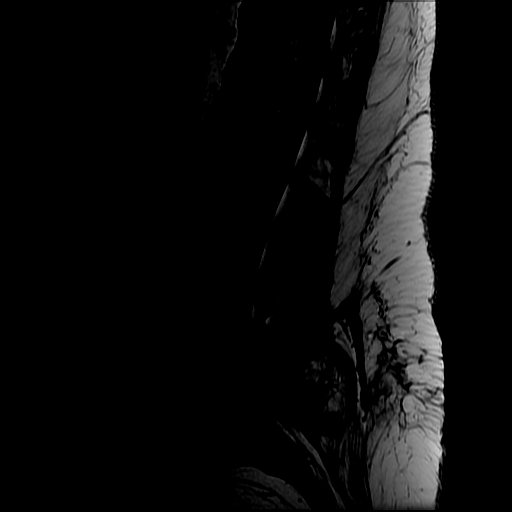
[im 12/12]
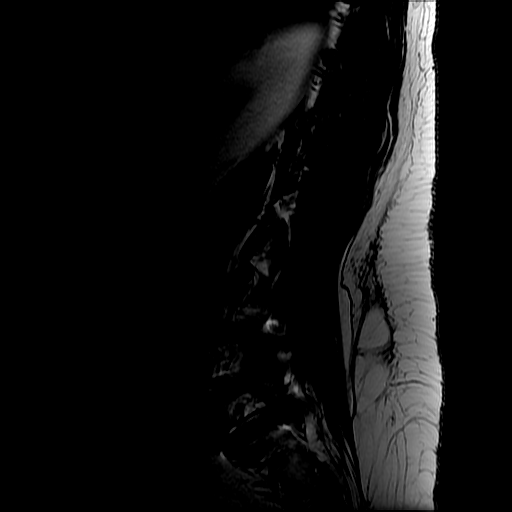

[Series 6: T2 · axial · 4.0mm · 0.39mm/px · z∈[-106,+73]mm · 8 of 40 slices shown (2 of 2)]
[im 3/40]
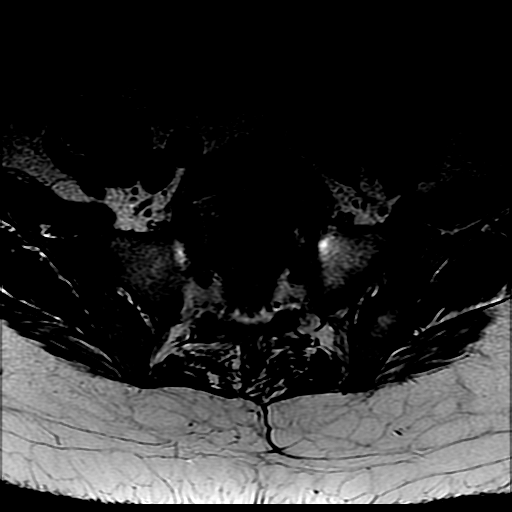
[im 5/40]
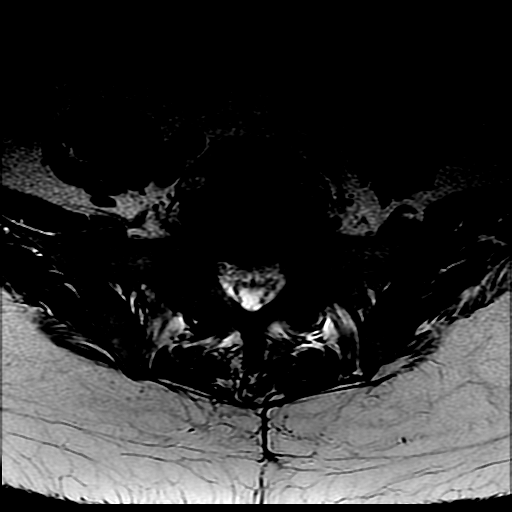
[im 8/40]
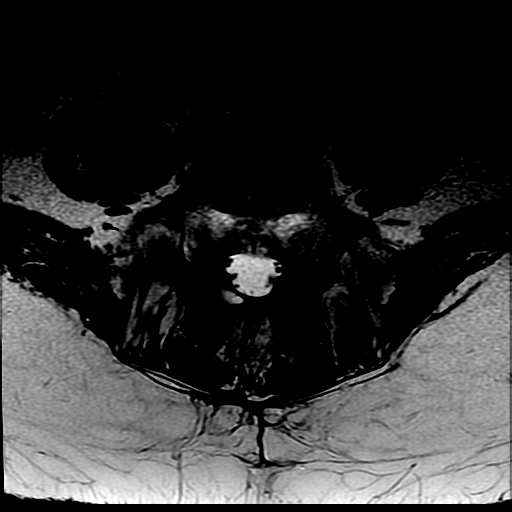
[im 13/40]
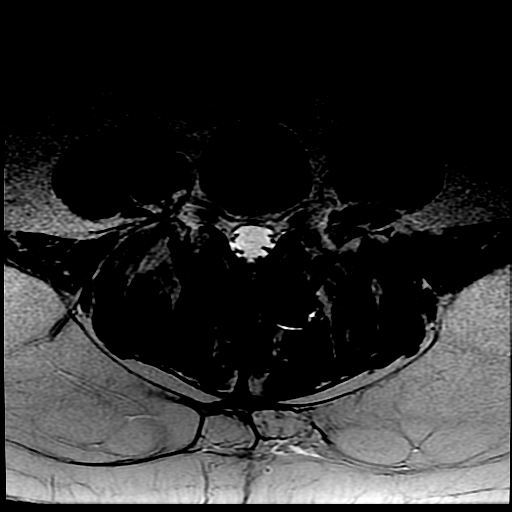
[im 18/40]
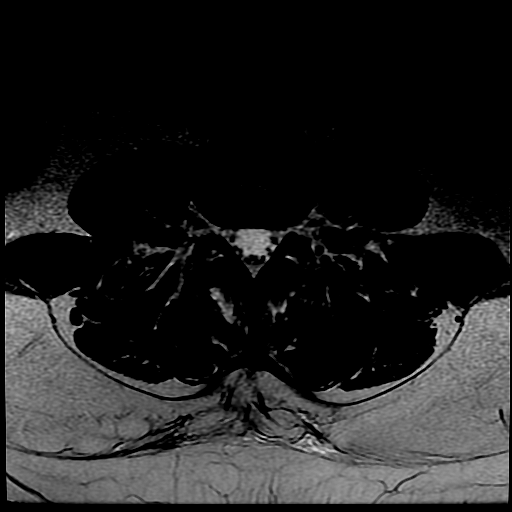
[im 20/40]
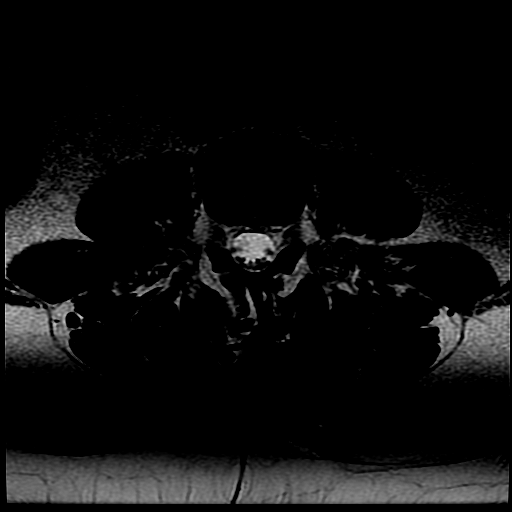
[im 22/40]
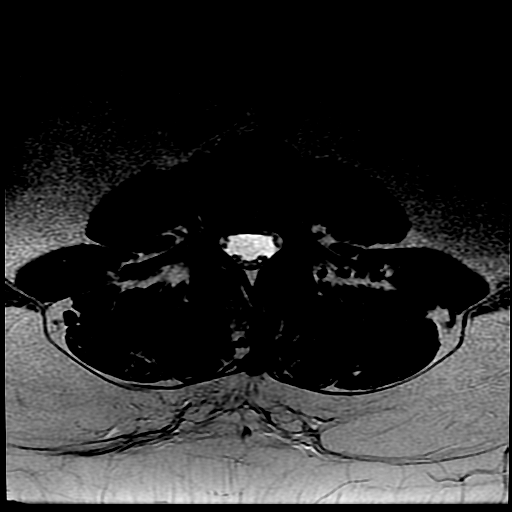
[im 35/40]
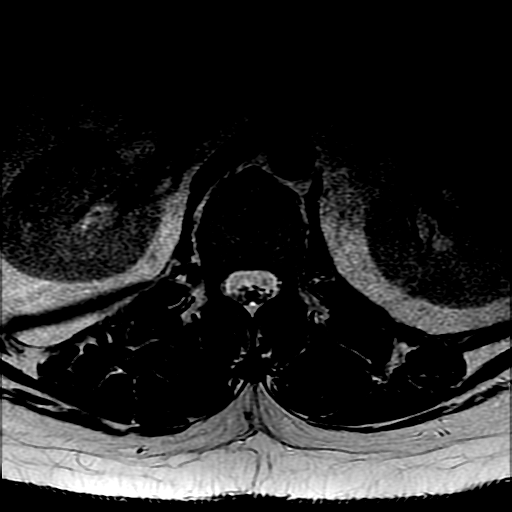

[Series 7: T1 · axial · 4.0mm · 0.39mm/px · z∈[-96,+73]mm · 3 of 40 slices shown (2 of 2)]
[im 5/40]
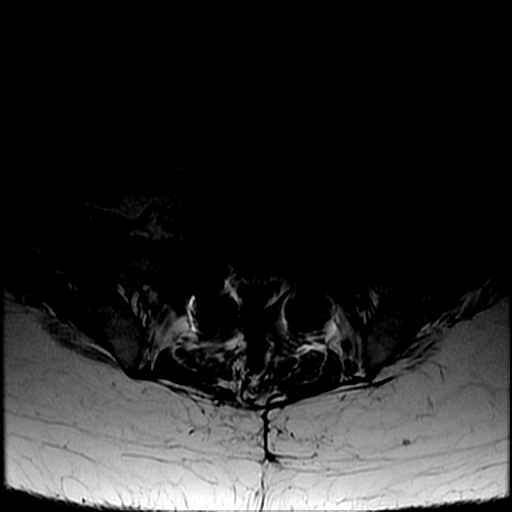
[im 20/40]
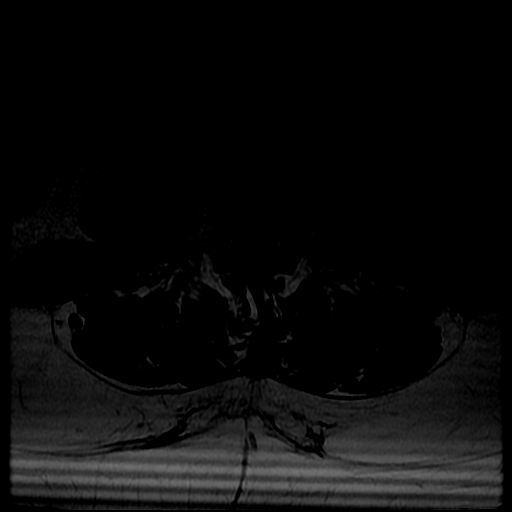
[im 35/40]
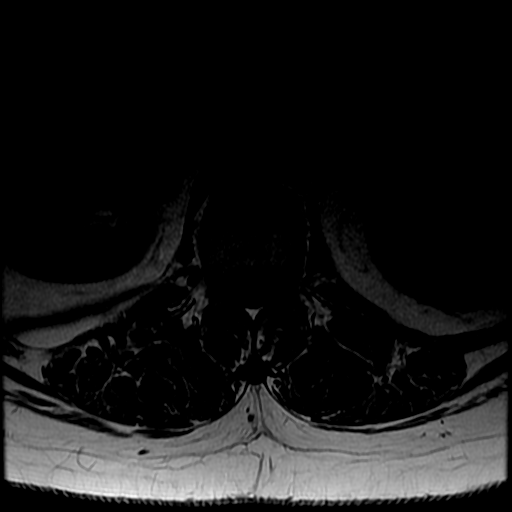

[18 of 48 positions shown; findings below may reference images not displayed]

FINDINGS: Status post L5-S1 laminectomies and undersurface L4-5 laminotomies.
L4 through S1 pedicle screws and bridging rods resulting in
susceptibility artifact. Stable grade 2 (8 mm) L5-S1 anterolisthesis
with interbody fusion material. The remaining lumbar vertebral body
disc heights maintained, with decreased T2 signal within the L4-5
disc compatible with mild desiccation. L1 superior endplate
Schmorl's node. No STIR signal abnormality to suggest fracture.

Multiple cysts in the kidney better characterized on CT abdomen and
pelvis April 08, 2015. Moderate paraspinal muscle atrophy at and
below the level of surgical intervention. Minimal seroma within the
paraspinal surgical bed at L5-S1.

Level by level evaluation:

L1-2 and L2-3: No disc bulge, canal stenosis nor neural foraminal
narrowing.

L3-4: No disc bulge. Mild facet arthropathy and ligamentum flavum
redundancy with trace facet effusions which are likely reactive. No
canal stenosis or neural foraminal narrowing.

L4-5: Central annular fissure, less likely discectomy. No
significant disc bulge. Status post apparent undersurface
laminotomies without canal stenosis or neural foraminal narrowing.

L5-S1: Status post laminectomies. No canal stenosis. No neural
foraminal narrowing.
IMPRESSION: Status post L5-S1 PLIF with L4-5 and L5-S1 posterior decompression.
Stable grade 2 L5-S1 anterolisthesis.

Central L4-5 annular fissure, less likely discectomy.

No canal stenosis or neural foraminal narrowing.

## 2017-05-12 ENCOUNTER — Other Ambulatory Visit: Payer: Self-pay

## 2017-05-12 ENCOUNTER — Encounter (HOSPITAL_COMMUNITY): Payer: Self-pay | Admitting: Psychiatry

## 2017-05-12 ENCOUNTER — Ambulatory Visit (INDEPENDENT_AMBULATORY_CARE_PROVIDER_SITE_OTHER): Payer: 59 | Admitting: Psychiatry

## 2017-05-12 VITALS — BP 138/96 | HR 97 | Ht 70.0 in | Wt 259.0 lb

## 2017-05-12 DIAGNOSIS — F411 Generalized anxiety disorder: Secondary | ICD-10-CM | POA: Diagnosis not present

## 2017-05-12 DIAGNOSIS — F17210 Nicotine dependence, cigarettes, uncomplicated: Secondary | ICD-10-CM

## 2017-05-12 DIAGNOSIS — F5102 Adjustment insomnia: Secondary | ICD-10-CM | POA: Diagnosis not present

## 2017-05-12 DIAGNOSIS — F341 Dysthymic disorder: Secondary | ICD-10-CM

## 2017-05-12 DIAGNOSIS — M549 Dorsalgia, unspecified: Secondary | ICD-10-CM

## 2017-05-12 DIAGNOSIS — F410 Panic disorder [episodic paroxysmal anxiety] without agoraphobia: Secondary | ICD-10-CM | POA: Diagnosis not present

## 2017-05-12 DIAGNOSIS — F419 Anxiety disorder, unspecified: Secondary | ICD-10-CM

## 2017-05-12 DIAGNOSIS — R450 Nervousness: Secondary | ICD-10-CM

## 2017-05-12 DIAGNOSIS — Z560 Unemployment, unspecified: Secondary | ICD-10-CM | POA: Diagnosis not present

## 2017-05-12 MED ORDER — DULOXETINE HCL 30 MG PO CPEP: 30.0000 mg | ORAL_CAPSULE | Freq: Two times a day (BID) | ORAL | 0 refills | Status: DC

## 2017-05-12 NOTE — Patient Instructions (Signed)
Refer to therapy  Refer for sleep study

## 2017-05-12 NOTE — Progress Notes (Signed)
Psychiatric Initial Adult Assessment   Patient Identification: OVA MEEGAN MRN:  [id number] Date of Evaluation:  05/12/2017 Referral Source: primary care Chief Complaint:   Chief Complaint    Establish Care; Other     Visit Diagnosis:    ICD-10-CM   1. Panic disorder F41.0   2. 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 GAD (generalized anxiety disorder) F41.1   4. Adjustment insomnia F51.02     History of Present Illness:  42 years old [name] referred for management of mood and anxiety  Patient suffers from anxiety for long. Has chronic back condition. Spinal fusion surgery which made his back wores. He is not on hydrocodone but takes nsaids. States stressed at home has 5 step kids. He cant work and function due to his pain. Endorses panic attacks, apprehension . Feels shaky, panic, palpitations and want to breath or get out of that room when panicking. endoreses worries, excessive and difficulty sleeping and mantaining sleep Low grade depression since childhood.  Recently more isolating, decreased interest and frustration with arguments at home.  Not taken ssri before.  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 Parents were going thru divorce  Previous Psychotropic Medications: No   Substance Abuse History in the last 12 months:  No.  Consequences of Substance Abuse: NA  Past Medical History:  Past Medical History:  Diagnosis Date   . Arthritis    "probably a touch in my back" (11/15/2014)  . Chronic lower back pain   . DDD (degenerative disc disease), lumbar   . Diverticulitis Dec 2016  . Neck pain   . Seizure (HCC)    had 1 seizure after back surgery from "too many muscle relaxers;.On no meds and no more seizure    Past Surgical History:  Procedure Laterality Date  . ANTERIOR CERVICAL DECOMP/DISCECTOMY FUSION N/A 08/30/2014   Procedure: ANTERIOR CERVICAL DECOMPRESSION/DISCECTOMY FUSION CERVICAL FIVE-SIX,CERVICAL SIX-SEVEN;  Surgeon: Tia Alert, MD;  Location: [facility];  Service: Neurosurgery;  Laterality: N/A;  . COLONOSCOPY WITH PROPOFOL N/A 05/13/2015   Procedure: COLONOSCOPY WITH PROPOFOL;  Surgeon: Corbin Ade, MD;  Location: [facility];  Service: Endoscopy;  Laterality: N/A;  1030  . POSTERIOR LUMBAR FUSION  11/15/2014  . TONSILLECTOMY      Family Psychiatric History: depression and anxiety: mom   Family History:  Family History  Problem Relation Age of Onset  . Heart disease Mother   . Seizures Mother   . Migraines Mother   . Heart attack Father   . Colon cancer Neg Hx        Thinks his Aunt may have been diagnosed but no first degree relatives    Social History:   Social History   Socioeconomic History  . Marital status: Married    Spouse name: None  . Number of children: 0  . Years of education: GED  . Highest education level: None  Social Needs  . Financial resource strain: None  . Food insecurity - worry: None  . Food insecurity - inability: None  .  Transportation needs - medical: None  . Transportation needs - non-medical: None  Occupational History  . Occupation: Unemployed  Tobacco Use  . Smoking status: Current Every Day Smoker    Packs/day: 0.50    Years: 24.00    Pack years: 12.00    Types: Cigarettes  . Smokeless tobacco: Never Used  . Tobacco comment: 11/15/2014 "down from 1 1/2 ppd in 08/2014"  Substance and Sexual Activity  . Alcohol use: No     Alcohol/week: 0.0 oz    Frequency: Never  . Drug use: No  . Sexual activity: Yes    Birth control/protection: Other-see comments    Comment: Wife had tubaligation  Other Topics Concern  . None  Social History Narrative   Lives at home with his wife and step-children.   Right-handed.   3/4 - 1 two liter of soda per day.    Additional Social History: grew up with parents. Till age 42. Then mom and step dad. Picked on at school . GED . No kids of his own. Has 5 step kids mostly teen or in twenties   Allergies:   Allergies  Allergen Reactions  . Flexeril [Cyclobenzaprine] Other (See Comments)    "seizures"    Metabolic Disorder Labs: No results found for: HGBA1C, MPG No results found for: PROLACTIN No results found for: CHOL, TRIG, HDL, CHOLHDL, VLDL, LDLCALC   Current Medications: Current Outpatient Medications  Medication Sig Dispense Refill  . Cholecalciferol (VITAMIN D PO) Take 25 Units by mouth daily.    Marland Kitchen. ibuprofen (ADVIL,MOTRIN) 200 MG tablet Take 200 mg by mouth daily as needed.    . diazepam (VALIUM) 5 MG tablet Take 1 tablet (5 mg total) by mouth 2 (two) times daily. (Patient not taking: Reported on 05/12/2017) 10 tablet 0  . DULoxetine (CYMBALTA) 30 MG capsule Take 1 capsule (30 mg total) by mouth 2 (two) times daily. Start one a day for 5 days then twice a day 60 capsule 0  . oxyCODONE-acetaminophen (PERCOCET) 10-325 MG per tablet Take 1 tablet by mouth every 4 (four) hours as needed for pain.    . polyethylene glycol-electrolytes (NULYTELY/GOLYTELY) 420 G solution Take 4,000 mLs by mouth once. (Patient not taking: Reported on 05/12/2017) 4000 mL 0   No current facility-administered medications for this visit.     Neurologic: Headache: No Seizure: No Paresthesias:No  Musculoskeletal: Strength & Muscle Tone: within normal limits Gait & Station: normal Patient leans: Front  Psychiatric Specialty Exam: Review of Systems  Cardiovascular: Negative for chest pain.   Musculoskeletal: Positive for back pain.  Skin: Negative for rash.  Psychiatric/Behavioral: Negative for substance abuse. The patient is nervous/anxious.     Blood pressure (!) 138/96, pulse 97, height 5\' 10"  (1.778 m), weight 259 lb (117.5 kg).Body mass index is 37.16 kg/m.  General Appearance: Casual  Eye Contact:  Fair  Speech:  Slow  Volume:  Decreased  Mood:  Depressed  Affect:  Congruent  Thought Process:  Goal Directed  Orientation:  Full (Time, Place, and Person)  Thought Content:  Logical  Suicidal Thoughts:  No  Homicidal Thoughts:  No  Memory:  Immediate;   Fair Recent;   Fair  Judgement:  Fair  Insight:  Fair  Psychomotor Activity:  Decreased  Concentration:  Concentration: Fair and Attention Span: Fair  Recall:  [employer]ervFair  Fund of Knowledge:Fair  Language: Fair  Akathisia:  Negative  Handed:  Right  AIMS (if indicated):    Assets:  Desire for Improvement  ADL's:  Intact  Cognition: WNL  Sleep:  poor    Treatment Plan Summary: Medication management and Plan as follows  1. 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 Choosing this as he has probable fibromyalgia says diagnosed with primary care 2. GAD; start cymbalta 3. Panic disorder; start cymbalta increase bid in one week Refer to therapy Would consider gabapentin or other meds if needed 4. InsomniaL: reviewed sleep hygiene. Refer to sleep study to rule out sleep apnea  Work on Pharmacologist . Increase acitivity during the day . FU 3-4 weeks    Thresa Ross, MD 1/9/20199:29 AM

## 2017-05-20 ENCOUNTER — Encounter: Payer: Self-pay | Admitting: Internal Medicine

## 2017-05-20 ENCOUNTER — Ambulatory Visit (INDEPENDENT_AMBULATORY_CARE_PROVIDER_SITE_OTHER): Payer: 59 | Admitting: Internal Medicine

## 2017-05-20 VITALS — BP 134/74 | HR 104 | Ht 70.0 in | Wt 255.5 lb

## 2017-05-20 DIAGNOSIS — G4733 Obstructive sleep apnea (adult) (pediatric): Secondary | ICD-10-CM | POA: Insufficient documentation

## 2017-05-20 NOTE — Patient Instructions (Signed)
Order- please schedule unattended home sleep test    Dx OSA  Please call for results and recommendation about 2 weeks after your sleep study. We might be able to start treatment, if appropriate, before we see you back.

## 2017-05-20 NOTE — Assessment & Plan Note (Signed)
High probability based on history and physical exam.  Appropriate discussion done covering basic sleep hygiene, physiology and medical concerns of sleep apnea, treatment options, importance of maintaining normal weight, responsibility to drive safely. Plan-schedule sleep study.  He is to call us for results anticipating we will likely order CPAP.

## 2017-05-20 NOTE — Progress Notes (Signed)
05/20/17-42 year old male smoker for sleep evaluation courtesy of Dr Fredda Hammed. Medical problem list includes degenerative disc disease, colon diverticulosis, fibromyalgia ------possible sleep apnea with depression. no sleep study.  Epworth score 3 He has been out of work for 4 years because of back pain problems.  Wife works third shift but tells him he snores a lot.  He feels unrested in the morning.  Some witnessed apnea.  No sleep medicines.  Little caffeine.  He wakes occasionally at night to cough up clear phlegm-attributed to smoking. ENT surgery-tonsils.  Previous cervical spine fusion.  Not aware of heart or lung problems.  Cymbalta has been helping sleep quality.  Needs white noise of fan running in bedroom.  Not aware of any parasomnias. Father had been a very loud snorer.  Prior to Admission medications   Medication Sig Start Date End Date Taking? Authorizing Provider  Cholecalciferol (VITAMIN D PO) Take 25 Units by mouth daily.   Yes [provider]  diazepam (VALIUM) 5 MG tablet Take 1 tablet (5 mg total) by mouth 2 (two) times daily. 06/04/15  Yes Neese, Hope M, NP  DULoxetine (CYMBALTA) 30 MG capsule Take 1 capsule (30 mg total) by mouth 2 (two) times daily. Start one a day for 5 days then twice a day 05/12/17  Yes Thresa Ross, MD  ibuprofen (ADVIL,MOTRIN) 200 MG tablet Take 200 mg by mouth daily as needed.   Yes [provider]  oxyCODONE-acetaminophen (PERCOCET) 10-325 MG per tablet Take 1 tablet by mouth every 4 (four) hours as needed for pain.   Yes [provider]   Past Medical History:  Diagnosis Date  . Arthritis    "probably a touch in my back" (11/15/2014)  . Chronic lower back pain   . DDD (degenerative disc disease), lumbar   . Diverticulitis Dec 2016  . Neck pain   . Seizure (HCC)    had 1 seizure after back surgery from "too many muscle relaxers;.On no meds and no more seizure   Past Surgical History:  Procedure Laterality Date  .  ANTERIOR CERVICAL DECOMP/DISCECTOMY FUSION N/A 08/30/2014   Procedure: ANTERIOR CERVICAL DECOMPRESSION/DISCECTOMY FUSION CERVICAL FIVE-SIX,CERVICAL SIX-SEVEN;  Surgeon: Tia Alert, MD;  Location: [facility];  Service: Neurosurgery;  Laterality: N/A;  . COLONOSCOPY WITH PROPOFOL N/A 05/13/2015   Procedure: COLONOSCOPY WITH PROPOFOL;  Surgeon: Corbin Ade, MD;  Location: [facility];  Service: Endoscopy;  Laterality: N/A;  1030  . POSTERIOR LUMBAR FUSION  11/15/2014  . TONSILLECTOMY     Family History  Problem Relation Age of Onset  . Heart disease Mother   . Seizures Mother   . Migraines Mother   . Heart attack Father   . Colon cancer Neg Hx        Thinks his Aunt may have been diagnosed but no first degree relatives   Social History   Socioeconomic History  . Marital status: Married    Spouse name: Not on file  . Number of children: 0  . Years of education: GED  . Highest education level: Not on file  Social Needs  . Financial resource strain: Not on file  . Food insecurity - worry: Not on file  . Food insecurity - inability: Not on file  . Transportation needs - medical: Not on file  . Transportation needs - non-medical: Not on file  Occupational History  . Occupation: Unemployed  Tobacco Use  . Smoking status: Current Every Day Smoker    Packs/day: 0.50  Years: 24.00    Pack years: 12.00    Types: Cigarettes  . Smokeless tobacco: Never Used  . Tobacco comment: 11/15/2014 "down from 1 1/2 ppd in 08/2014"  Substance and Sexual Activity  . Alcohol use: No    Alcohol/week: 0.0 oz    Frequency: Never  . Drug use: No  . Sexual activity: Yes    Birth control/protection: Other-see comments    Comment: Wife had tubaligation  Other Topics Concern  . Not on file  Social History Narrative   Lives at home with his wife and step-children.   Right-handed.   3/4 - 1 two liter of soda per day.   ROS-see HPI   + = positive Constitutional:    weight loss, night sweats,  fevers, chills, fatigue, lassitude. HEENT:    headaches, difficulty swallowing, tooth/dental problems, sore throat,       sneezing, itching, ear ache, nasal congestion, post nasal drip, snoring CV:    chest pain, orthopnea, PND, swelling in lower extremities, anasarca,                                                  dizziness, palpitations Resp:   +shortness of breath with exertion or at rest.               + productive cough,   non-productive cough, coughing up of blood.              change in color of mucus.  wheezing.   Skin:    rash or lesions. GI:  +heartburn,+ indigestion, abdominal pain, nausea, vomiting, diarrhea,                 change in bowel habits, loss of appetite GU: dysuria, change in color of urine, no urgency or frequency.   flank pain. MS:   joint pain, stiffness, decreased range of motion, back pain. Neuro-     nothing unusual Psych:  change in mood or affect.  +depression or +anxiety.   memory loss.  OBJ- Physical Exam General- Alert, Oriented, Affect-appropriate, Distress- none acute, + overweight  Skin- rash-none, lesions- none, excoriation- none Lymphadenopathy- none Head- atraumatic            Eyes- Gross vision intact, PERRLA, conjunctivae and secretions clear            Ears- Hearing, canals-normal            Nose- Clear, no-Septal dev, mucus, polyps, erosion, perforation             Throat- Mallampati III-IV , mucosa clear , drainage- none, tonsils- atrophic Neck- flexible , trachea midline, no stridor , thyroid nl, carotid no bruit Chest - symmetrical excursion , unlabored           Heart/CV- RRR , no murmur , no gallop  , no rub, nl s1 s2                           - JVD- none , edema- none, stasis changes- none, varices- none           Lung- clear to P&A, wheeze- none, cough- none , dullness-none, rub- none           Chest wall-  Abd-  Br/ Gen/ Rectal- Not done, not indicated Extrem- cyanosis- none,  clubbing, none, atrophy- none, strength- nl Neuro-  grossly intact to observation

## 2017-06-04 ENCOUNTER — Telehealth (HOSPITAL_COMMUNITY): Payer: Self-pay | Admitting: Psychiatry

## 2017-06-04 NOTE — Telephone Encounter (Signed)
Pt states, since he has started meds, he is having dry mouth throat swollen, diarrhea, and he almost passed out when he laid down the other night.   Please advise.  cb # [phone]

## 2017-06-04 NOTE — Telephone Encounter (Signed)
Informed patient per Dr. Gilmore LarocheAkhtar  take one Cymbalta daily one hour after breakfast or lunch, and drink enough water. I informed him to call back with any concerns or go to the ER if it is urgent. Nothing further is needed at this time.

## 2017-06-04 NOTE — Telephone Encounter (Signed)
Cut down cymbalta to one a day. Do not take empty stomach or early morning. Drink enough water  Take med after one hour breakfast or lunch Call back for concerns or go to eR if urgent

## 2017-06-10 ENCOUNTER — Encounter (HOSPITAL_COMMUNITY): Payer: Self-pay | Admitting: Psychiatry

## 2017-06-10 ENCOUNTER — Other Ambulatory Visit: Payer: Self-pay

## 2017-06-10 ENCOUNTER — Ambulatory Visit (INDEPENDENT_AMBULATORY_CARE_PROVIDER_SITE_OTHER): Payer: 59 | Admitting: Psychiatry

## 2017-06-10 VITALS — BP 130/88 | HR 83 | Ht 70.0 in | Wt 256.0 lb

## 2017-06-10 DIAGNOSIS — M549 Dorsalgia, unspecified: Secondary | ICD-10-CM

## 2017-06-10 DIAGNOSIS — F410 Panic disorder [episodic paroxysmal anxiety] without agoraphobia: Secondary | ICD-10-CM | POA: Diagnosis not present

## 2017-06-10 DIAGNOSIS — F411 Generalized anxiety disorder: Secondary | ICD-10-CM | POA: Diagnosis not present

## 2017-06-10 DIAGNOSIS — F17210 Nicotine dependence, cigarettes, uncomplicated: Secondary | ICD-10-CM | POA: Diagnosis not present

## 2017-06-10 DIAGNOSIS — F341 Dysthymic disorder: Secondary | ICD-10-CM | POA: Diagnosis not present

## 2017-06-10 DIAGNOSIS — F5102 Adjustment insomnia: Secondary | ICD-10-CM

## 2017-06-10 DIAGNOSIS — Z818 Family history of other mental and behavioral disorders: Secondary | ICD-10-CM

## 2017-06-10 DIAGNOSIS — Z560 Unemployment, unspecified: Secondary | ICD-10-CM | POA: Diagnosis not present

## 2017-06-10 DIAGNOSIS — Z634 Disappearance and death of family member: Secondary | ICD-10-CM | POA: Diagnosis not present

## 2017-06-10 DIAGNOSIS — R450 Nervousness: Secondary | ICD-10-CM | POA: Diagnosis not present

## 2017-06-10 MED ORDER — DULOXETINE HCL 30 MG PO CPEP: 30.0000 mg | ORAL_CAPSULE | Freq: Every day | ORAL | 0 refills | Status: DC

## 2017-06-10 MED ORDER — BUSPIRONE HCL 7.5 MG PO TABS: 7.5000 mg | ORAL_TABLET | Freq: Two times a day (BID) | ORAL | 0 refills | Status: DC | PRN

## 2017-06-10 NOTE — Progress Notes (Signed)
[facility]  [facility]  Patient Identification: Eduardo Barnes MRN:  [id number] Date of Evaluation:  06/10/2017 Referral Source: primary care Chief Complaint:   Chief Complaint    Other; Follow-up     Visit Diagnosis:    ICD-10-CM   1. Panic disorder F41.0   2. GAD (generalized anxiety disorder) F41.1   3. Adjustment insomnia F51.02   4. 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 Has chronic back condition. Spinal fusion surgery which made his back wores.  Has lost mom and other losses last year that started panic and anxiety  Last visit cymbalta was recommended and increase to bid. It did help but was having night sweats . 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 Parents were going thru divorce  Previous Psychotropic Medications: No   Substance Abuse History in the last 12 months:  No.  Consequences of Substance Abuse: NA  Past Medical History:  Past Medical History:  Diagnosis Date  . Arthritis    "probably a touch in my back" (11/15/2014)  . Chronic lower back pain   . DDD (degenerative disc disease), lumbar   . Diverticulitis Dec 2016  . Neck pain   . Seizure (HCC)    had 1 seizure after back surgery from "too many muscle relaxers;.On no meds and no more seizure    Past Surgical History:  Procedure Laterality Date  . ANTERIOR CERVICAL DECOMP/DISCECTOMY FUSION N/A 08/30/2014   Procedure: ANTERIOR CERVICAL DECOMPRESSION/DISCECTOMY  FUSION CERVICAL FIVE-SIX,CERVICAL SIX-SEVEN;  Surgeon: Tia Alertavid S Jones, MD;  Location: [facility];  Service: Neurosurgery;  Laterality: N/A;  . COLONOSCOPY WITH PROPOFOL N/A 05/13/2015   Procedure: COLONOSCOPY WITH PROPOFOL;  Surgeon: Corbin Adeobert M Rourk, MD;  Location: [facility];  Service: Endoscopy;  Laterality: N/A;  1030  . POSTERIOR LUMBAR FUSION  11/15/2014  . TONSILLECTOMY      Family Psychiatric History: depression and anxiety: mom   Family History:  Family History  Problem Relation Age of Onset  . Heart disease Mother   . Seizures Mother   . Migraines Mother   . Heart attack Father   . Colon cancer Neg Hx        Thinks his Aunt may have been diagnosed but no first degree relatives    Social History:   Social History   Socioeconomic History  . Marital status: Married    Spouse name: None  . Number of children: 0  . Years of education: GED  . Highest education level: None  Social Needs  . Financial resource strain: None  . Food insecurity - worry: None  . Food insecurity - inability: None  . Transportation needs - medical: None  . Transportation needs - non-medical: None  Occupational History  . Occupation: Unemployed  Tobacco Use  . Smoking status: Current Every Day Smoker    Packs/day: 0.50    Years: 24.00    Pack years: 12.00    Types:  Cigarettes  . Smokeless tobacco: Never Used  . Tobacco comment: 11/15/2014 "down from 1 1/2 ppd in 08/2014"  Substance and Sexual Activity  . Alcohol use: No    Alcohol/week: 0.0 oz    Frequency: Never  . Drug use: No  . Sexual activity: Yes    Birth control/protection: Other-see comments    Comment: Wife had tubaligation  Other Topics Concern  . None  Social History Narrative   Lives at home with his wife and step-children.   Right-handed.   3/4 - 1 two liter of soda per day.      Allergies:   Allergies  Allergen Reactions  . Flexeril [Cyclobenzaprine] Other (See Comments)    "seizures"    Metabolic Disorder  Labs: No results found for: HGBA1C, MPG No results found for: PROLACTIN No results found for: CHOL, TRIG, HDL, CHOLHDL, VLDL, LDLCALC   Current Medications: Current Outpatient Medications  Medication Sig Dispense Refill  . Cholecalciferol (VITAMIN D PO) Take 25 Units by mouth daily.    . DULoxetine (CYMBALTA) 30 MG capsule Take 1 capsule (30 mg total) by mouth daily. Start one a day for 5 days then twice a day 30 capsule 0  . busPIRone (BUSPAR) 7.5 MG tablet Take 1 tablet (7.5 mg total) by mouth 2 (two) times daily as needed. 45 tablet 0  . ibuprofen (ADVIL,MOTRIN) 200 MG tablet Take 200 mg by mouth daily as needed.    Marland Kitchen oxyCODONE-acetaminophen (PERCOCET) 10-325 MG per tablet Take 1 tablet by mouth every 4 (four) hours as needed for pain.     No current facility-administered medications for this visit.       Psychiatric Specialty Exam: Review of Systems  Cardiovascular: Negative for palpitations.  Musculoskeletal: Positive for back pain.  Skin: Negative for rash.  Psychiatric/Behavioral: Negative for substance abuse. The patient is nervous/anxious.     Blood pressure 130/88, pulse 83, height 5\' 10"  (1.778 m), weight 256 lb (116.1 kg).Body mass index is 36.73 kg/m.  General Appearance: Casual  Eye Contact:  Fair  Speech:  Slow  Volume:  Decreased  Mood:  Fair but endorses worries  Affect:  congruent  Thought Process:  Goal Directed  Orientation:  Full (Time, Place, and Person)  Thought Content:  Logical  Suicidal Thoughts:  No  Homicidal Thoughts:  No  Memory:  Immediate;   Fair Recent;   Fair  Judgement:  Fair  Insight:  Fair  Psychomotor Activity:  Decreased  Concentration:  Concentration: Fair and Attention Span: Fair  Recall:  [employer] of Knowledge:Fair  Language: Fair  Akathisia:  Negative  Handed:  Right  AIMS (if indicated):    Assets:  Desire for Improvement  ADL's:  Intact  Cognition: WNL  Sleep:  poor    Treatment Plan Summary: Medication  management and Plan as follows  1. 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 GAD; stressed. Continue cymbalta. Add buspar 7.5mg  upto bid prn 3. Panic disorder; continue cymbalta it has helped. willl review if sweating persist Would consider gabapentin or other meds if needed 4. InsomniaL: reviewed sleep hygiene. Refer to sleep study to rule out sleep apnea Has in home study today  Work in therapy to cope with anxiety as well.  Fu 4 w. Call for cymbalta refill     Thresa Ross, MD 2/7/20198:55 AM

## 2017-06-11 DIAGNOSIS — G4733 Obstructive sleep apnea (adult) (pediatric): Secondary | ICD-10-CM | POA: Diagnosis not present

## 2017-06-15 DIAGNOSIS — G4733 Obstructive sleep apnea (adult) (pediatric): Secondary | ICD-10-CM | POA: Diagnosis not present

## 2017-06-16 ENCOUNTER — Ambulatory Visit (INDEPENDENT_AMBULATORY_CARE_PROVIDER_SITE_OTHER): Payer: 59 | Admitting: Licensed Clinical Social Worker

## 2017-06-16 ENCOUNTER — Telehealth (HOSPITAL_COMMUNITY): Payer: Self-pay | Admitting: Psychiatry

## 2017-06-16 DIAGNOSIS — F410 Panic disorder [episodic paroxysmal anxiety] without agoraphobia: Secondary | ICD-10-CM

## 2017-06-16 DIAGNOSIS — F411 Generalized anxiety disorder: Secondary | ICD-10-CM | POA: Diagnosis not present

## 2017-06-16 DIAGNOSIS — F5102 Adjustment insomnia: Secondary | ICD-10-CM

## 2017-06-16 MED ORDER — DULOXETINE HCL 30 MG PO CPEP: 30.0000 mg | ORAL_CAPSULE | Freq: Two times a day (BID) | ORAL | 0 refills | Status: DC

## 2017-06-16 NOTE — Telephone Encounter (Signed)
Pt needs refill on cymbalta sent to [facility] in [place]

## 2017-06-16 NOTE — Telephone Encounter (Signed)
Informed patient of refill. Nothing further is needed at this time.
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
## 2017-06-16 NOTE — Telephone Encounter (Signed)
I sent refill 

## 2017-06-17 ENCOUNTER — Other Ambulatory Visit: Payer: Self-pay | Admitting: *Deleted

## 2017-06-17 DIAGNOSIS — G4733 Obstructive sleep apnea (adult) (pediatric): Secondary | ICD-10-CM

## 2017-07-02 ENCOUNTER — Ambulatory Visit (HOSPITAL_COMMUNITY): Payer: Self-pay | Admitting: Psychiatry

## 2017-07-05 ENCOUNTER — Ambulatory Visit (INDEPENDENT_AMBULATORY_CARE_PROVIDER_SITE_OTHER): Payer: 59 | Admitting: Psychiatry

## 2017-07-05 ENCOUNTER — Encounter (HOSPITAL_COMMUNITY): Payer: Self-pay | Admitting: Psychiatry

## 2017-07-05 VITALS — BP 122/82 | HR 85 | Ht 70.0 in | Wt 258.0 lb

## 2017-07-05 DIAGNOSIS — F411 Generalized anxiety disorder: Secondary | ICD-10-CM

## 2017-07-05 DIAGNOSIS — F17210 Nicotine dependence, cigarettes, uncomplicated: Secondary | ICD-10-CM

## 2017-07-05 DIAGNOSIS — F341 Dysthymic disorder: Secondary | ICD-10-CM

## 2017-07-05 DIAGNOSIS — F5102 Adjustment insomnia: Secondary | ICD-10-CM | POA: Diagnosis not present

## 2017-07-05 DIAGNOSIS — M549 Dorsalgia, unspecified: Secondary | ICD-10-CM | POA: Diagnosis not present

## 2017-07-05 DIAGNOSIS — Z560 Unemployment, unspecified: Secondary | ICD-10-CM

## 2017-07-05 DIAGNOSIS — Z818 Family history of other mental and behavioral disorders: Secondary | ICD-10-CM

## 2017-07-05 DIAGNOSIS — F410 Panic disorder [episodic paroxysmal anxiety] without agoraphobia: Secondary | ICD-10-CM | POA: Diagnosis not present

## 2017-07-05 DIAGNOSIS — Z634 Disappearance and death of family member: Secondary | ICD-10-CM | POA: Diagnosis not present

## 2017-07-05 MED ORDER — DULOXETINE HCL 30 MG PO CPEP: 30.0000 mg | ORAL_CAPSULE | Freq: Two times a day (BID) | ORAL | 2 refills | Status: DC

## 2017-07-05 NOTE — Progress Notes (Signed)
[facility] Outpatient Follow  Up visit  Patient Identification: Eduardo Barnes MRN:  [id number] Date of Evaluation:  07/05/2017 Referral Source: primary care Chief Complaint:   Chief Complaint    Follow-up; Medication Refill     Visit Diagnosis:    ICD-10-CM   1. Panic disorder F41.0   2. GAD (generalized anxiety disorder) F41.1   3. Adjustment insomnia F51.02   4. 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 Has chronic back condition. Spinal fusion surgery which made his back wores.  Has lost mom and other losses last year that started panic and anxiety  cymbalta helps depression but he still sweats. 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 Parents were going thru divorce  Previous Psychotropic Medications: No   Substance Abuse History in the last 12 months:  No.  Consequences of Substance Abuse: NA  Past Medical History:  Past Medical History:  Diagnosis Date  . Arthritis    "probably a touch in my back" (11/15/2014)  . Chronic lower back pain   . DDD (degenerative disc disease), lumbar   . Diverticulitis Dec 2016  . Neck pain   . Seizure (HCC)    had 1 seizure after back surgery from "too many muscle relaxers;.On no meds and no more seizure    Past Surgical History:  Procedure Laterality Date  . ANTERIOR CERVICAL DECOMP/DISCECTOMY FUSION N/A 08/30/2014   Procedure: ANTERIOR CERVICAL DECOMPRESSION/DISCECTOMY FUSION CERVICAL FIVE-SIX,CERVICAL  SIX-SEVEN;  Surgeon: Tia Alert, MD;  Location: [facility];  Service: Neurosurgery;  Laterality: N/A;  . COLONOSCOPY WITH PROPOFOL N/A 05/13/2015   Procedure: COLONOSCOPY WITH PROPOFOL;  Surgeon: Corbin Ade, MD;  Location: [facility];  Service: Endoscopy;  Laterality: N/A;  1030  . POSTERIOR LUMBAR FUSION  11/15/2014  . TONSILLECTOMY      Family Psychiatric History: depression and anxiety: mom   Family History:  Family History  Problem Relation Age of Onset  . Heart disease Mother   . Seizures Mother   . Migraines Mother   . Heart attack Father   . Colon cancer Neg Hx        Thinks his Aunt may have been diagnosed but no first degree relatives    Social History:   Social History   Socioeconomic History  . Marital status: Married    Spouse name: None  . Number of children: 0  . Years of education: GED  . Highest education level: None  Social Needs  . Financial resource strain: None  . Food insecurity - worry: None  . Food insecurity - inability: None  . Transportation needs - medical: None  . Transportation needs - non-medical: None  Occupational History  . Occupation: Unemployed  Tobacco Use  . Smoking status: Current Every Day Smoker    Packs/day: 0.50    Years: 24.00    Pack years: 12.00    Types: Cigarettes  . Smokeless tobacco: Never Used  .  Tobacco comment: 11/15/2014 "down from 1 1/2 ppd in 08/2014"  Substance and Sexual Activity  . Alcohol use: No    Alcohol/week: 0.0 oz    Frequency: Never  . Drug use: No  . Sexual activity: Yes    Birth control/protection: Other-see comments    Comment: Wife had tubaligation  Other Topics Concern  . None  Social History Narrative   Lives at home with his wife and step-children.   Right-handed.   3/4 - 1 two liter of soda per day.      Allergies:   Allergies  Allergen Reactions  . Flexeril [Cyclobenzaprine] Other (See Comments)    "seizures"    Metabolic Disorder Labs: No results found for:  HGBA1C, MPG No results found for: PROLACTIN No results found for: CHOL, TRIG, HDL, CHOLHDL, VLDL, LDLCALC   Current Medications: Current Outpatient Medications  Medication Sig Dispense Refill  . busPIRone (BUSPAR) 7.5 MG tablet Take 1 tablet (7.5 mg total) by mouth 2 (two) times daily as needed. 45 tablet 0  . Cholecalciferol (VITAMIN D PO) Take 25 Units by mouth daily.    . DULoxetine (CYMBALTA) 30 MG capsule Take 1 capsule (30 mg total) by mouth 2 (two) times daily. 60 capsule 2  . ibuprofen (ADVIL,MOTRIN) 200 MG tablet Take 200 mg by mouth daily as needed.    Marland Kitchen. oxyCODONE-acetaminophen (PERCOCET) 10-325 MG per tablet Take 1 tablet by mouth every 4 (four) hours as needed for pain.     No current facility-administered medications for this visit.       Psychiatric Specialty Exam: Review of Systems  Cardiovascular: Negative for chest pain and palpitations.  Musculoskeletal: Positive for back pain.  Skin: Negative for rash.  Psychiatric/Behavioral: Negative for depression and substance abuse.    Blood pressure 122/82, pulse 85, height 5\' 10"  (1.778 m), weight 258 lb (117 kg).Body mass index is 37.02 kg/m.  General Appearance: Casual  Eye Contact:  Fair  Speech:  Slow  Volume:  Decreased  Mood: better  Affect:  congruent  Thought Process:  Goal Directed  Orientation:  Full (Time, Place, and Person)  Thought Content:  Logical  Suicidal Thoughts:  No  Homicidal Thoughts:  No  Memory:  Immediate;   Fair Recent;   Fair  Judgement:  Fair  Insight:  Fair  Psychomotor Activity:  Decreased  Concentration:  Concentration: Fair and Attention Span: Fair  Recall:  FiservFair  Fund of Knowledge:Fair  Language: Fair  Akathisia:  Negative  Handed:  Right  AIMS (if indicated):    Assets:  Desire for Improvement  ADL's:  Intact  Cognition: WNL  Sleep:  poor    Treatment Plan Summary: Medication management and Plan as follows  1. Major depression vs dysthymia:fair. Continue  cymbalta 2. GAD; less stressed. Continue meds cymbalta 3. Panic disorder; not worse. Continue cymbalta 4. InsomniaL: has more dreams and sweats. Waiting for sleep study results  fu3 m. Renew cymbalta    Thresa RossNadeem Joelys Staubs, MD 3/4/201910:11 AM

## 2017-07-13 ENCOUNTER — Ambulatory Visit (INDEPENDENT_AMBULATORY_CARE_PROVIDER_SITE_OTHER): Payer: 59 | Admitting: Licensed Clinical Social Worker

## 2017-07-13 DIAGNOSIS — F5102 Adjustment insomnia: Secondary | ICD-10-CM | POA: Diagnosis not present

## 2017-07-13 DIAGNOSIS — F410 Panic disorder [episodic paroxysmal anxiety] without agoraphobia: Secondary | ICD-10-CM

## 2017-07-13 DIAGNOSIS — F411 Generalized anxiety disorder: Secondary | ICD-10-CM

## 2017-07-13 DIAGNOSIS — F341 Dysthymic disorder: Secondary | ICD-10-CM | POA: Diagnosis not present

## 2017-07-13 NOTE — Progress Notes (Addendum)
   THERAPIST PROGRESS NOTE  Session Time: 9:10 AM to 9:55 AM  Participation Level: Active  Behavioral Response: CasualAlertEuthymic  Type of Therapy: Individual Therapy  Treatment Goals addressed: coping, stress management, mood regulation  Interventions: Solution Focused, Strength-based, Supportive, Family Systems and Other: and coping  Summary: Pollyann GlenSteven T Reppond is a 42 y.o. male who presents with rushing around with things to do and also tired from not sleeping. He relates some related to insomnia and some related to schedule. Therapist discussed working on issues with anxiety and patient shares that it is more that "brain doesn't shut down". Therapist pointed out cognitive distortion of negative for casting related to anxiety when patient explains that he worries about what will happen next day, worries about taking care of the house, describes stress of wanting  take care of things but ends up not getting done due to schedules of people in the house. Patient discussed main stressor that kids are old enough now that they can go out on their own but have learned from wife on how to play the victim role. They don't help out, they get away with anything they want. Shared for example that 2 of the older ones who are 20 and 22 have not paid rent and they are behind in rent for a year. Patient relates when he tries to talk to his wife she acts like he is attacking her, not willing to change parenting style. Relates some underlying reason may to kids father relate grew up in a negative environment and mom compensating for that now. He shares with her he is trying to create positive change but dynamic is he is not supposed to have an opinion and say anything. Therapist discussed that enabling kids as not helpful for kids and parenting involves helping them to become more responsible and independent. Therapist identified unhealthy dynamic in family system, discuss strategies to help change it and if not  able to change it develop another approach either in terms of how he views the situation or radical acceptance when unable to change situation. Discussed stressors with kids but good relationship with wife when kids not involved. Reviewed session and patient relates helped him to be able to get things off his chest.  Suicidal/Homicidal: No  Therapist Response: assessment patient current functioning per report and worked on building therapeutic rapport as this is patient's second session. Identified dysfunctional family systems and discussed ways to change unhealthy dynamics. Discussed communication along with effective strategies in communicating message so it is communicated effectively. Identified wife as engaging in enabling behaviors contributing to dysfunction and her unwillingness to see her behaviors contributing to problems. Discussed gaining more insight to her motivations and intentions to understand better underlying causes to give him more insight to help in  making positive changes. Discussed radical acceptance as an option if patient feels unable to change the situation discuss it is giving up fighting reality and release psychological resources to move forward. Discussed problem solving and helping to change stressor. Help patient to process feelings and provided supportive and strength-based interventions.  Plan: Return again in 2 weeks.2.Therapist continued to work with patient on coping with stressors, mood regulation, helping patient to change dysfunction and family systems  Diagnosis: Axis I: panic disorder, generalized anxiety disorder, adjustment insomnia, dysthymia    Axis II: No diagnosis    Coolidge BreezeMary Zaraya Delauder, LCSW 07/13/2017

## 2017-07-27 ENCOUNTER — Ambulatory Visit (HOSPITAL_COMMUNITY): Payer: Self-pay | Admitting: Licensed Clinical Social Worker

## 2017-08-18 ENCOUNTER — Ambulatory Visit: Payer: Self-pay | Admitting: Internal Medicine

## 2017-09-28 ENCOUNTER — Other Ambulatory Visit: Payer: Self-pay

## 2017-09-28 ENCOUNTER — Ambulatory Visit (INDEPENDENT_AMBULATORY_CARE_PROVIDER_SITE_OTHER): Payer: 59 | Admitting: Psychiatry

## 2017-09-28 ENCOUNTER — Encounter (HOSPITAL_COMMUNITY): Payer: Self-pay | Admitting: Psychiatry

## 2017-09-28 VITALS — BP 144/92 | HR 95 | Ht 70.0 in | Wt 255.0 lb

## 2017-09-28 DIAGNOSIS — F410 Panic disorder [episodic paroxysmal anxiety] without agoraphobia: Secondary | ICD-10-CM

## 2017-09-28 DIAGNOSIS — M549 Dorsalgia, unspecified: Secondary | ICD-10-CM

## 2017-09-28 DIAGNOSIS — F341 Dysthymic disorder: Secondary | ICD-10-CM | POA: Diagnosis not present

## 2017-09-28 DIAGNOSIS — F411 Generalized anxiety disorder: Secondary | ICD-10-CM | POA: Diagnosis not present

## 2017-09-28 DIAGNOSIS — F5102 Adjustment insomnia: Secondary | ICD-10-CM

## 2017-09-28 DIAGNOSIS — Z560 Unemployment, unspecified: Secondary | ICD-10-CM

## 2017-09-28 DIAGNOSIS — F17210 Nicotine dependence, cigarettes, uncomplicated: Secondary | ICD-10-CM | POA: Diagnosis not present

## 2017-09-28 MED ORDER — DULOXETINE HCL 30 MG PO CPEP: 30.0000 mg | ORAL_CAPSULE | Freq: Every day | ORAL | 2 refills | Status: DC

## 2017-09-28 NOTE — Progress Notes (Signed)
[facility] Outpatient Follow  Up visit  Patient Identification: Eduardo Barnes MRN:  [id number] Date of Evaluation:  09/28/2017 Referral Source: primary care Chief Complaint:   Chief Complaint    Follow-up; Other     Visit Diagnosis:    ICD-10-CM   1. Panic disorder F41.0   2. GAD (generalized anxiety disorder) F41.1   3. Adjustment insomnia F51.02   4. 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 Parents were going thru divorce  Previous Psychotropic Medications: No   Substance Abuse History in the last 12 months:  No.  Consequences of Substance Abuse: NA  Past Medical History:  Past Medical History:  Diagnosis Date  . Arthritis    "probably a touch in my back" (11/15/2014)  . Chronic lower back pain   . DDD (degenerative disc disease), lumbar   . Diverticulitis Dec 2016  . Neck pain   . Seizure (HCC)    had 1 seizure after back surgery from "too many muscle relaxers;.On no meds and no more seizure    Past Surgical History:  Procedure Laterality Date  . ANTERIOR CERVICAL DECOMP/DISCECTOMY FUSION N/A 08/30/2014   Procedure: ANTERIOR CERVICAL DECOMPRESSION/DISCECTOMY FUSION CERVICAL FIVE-SIX,CERVICAL SIX-SEVEN;  Surgeon: Tia Alert, MD;  Location: [facility];  Service: Neurosurgery;  Laterality: N/A;  . COLONOSCOPY WITH PROPOFOL N/A 05/13/2015   Procedure: COLONOSCOPY WITH PROPOFOL;  Surgeon: Corbin Ade, MD;  Location: [facility];  Service: Endoscopy;  Laterality: N/A;  1030  . POSTERIOR LUMBAR  FUSION  11/15/2014  . TONSILLECTOMY      Family Psychiatric History: depression and anxiety: mom   Family History:  Family History  Problem Relation Age of Onset  . Heart disease Mother   . Seizures Mother   . Migraines Mother   . Heart attack Father   . Colon cancer Neg Hx        Thinks his Aunt may have been diagnosed but no first degree relatives    Social History:   Social History   Socioeconomic History  . Marital status: Married    Spouse name: Not on file  . Number of children: 0  . Years of education: GED  . Highest education level: Not on file  Occupational History  . Occupation: Unemployed  Social Needs  . Financial resource strain: Not on file  . Food insecurity:    Worry: Not on file    Inability: Not on file  . Transportation needs:    Medical: Not on file    Non-medical: Not on file  Tobacco Use  . Smoking status: Current Every Day Smoker    Packs/day: 0.50    Years: 24.00    Pack years: 12.00    Types: Cigarettes  . Smokeless tobacco: Never Used  . Tobacco comment: 11/15/2014 "down from 1 1/2 ppd in 08/2014"  Substance and Sexual Activity  . Alcohol use: No    Alcohol/week: 0.0 oz    Frequency: Never  . Drug use: No  . Sexual activity: Yes  Birth control/protection: Other-see comments    Comment: Wife had tubaligation  Lifestyle  . Physical activity:    Days per week: Not on file    Minutes per session: Not on file  . Stress: Not on file  Relationships  . Social connections:    Talks on phone: Not on file    Gets together: Not on file    Attends religious service: Not on file    Active member of club or organization: Not on file    Attends meetings of clubs or organizations: Not on file    Relationship status: Not on file  Other Topics Concern  . Not on file  Social History Narrative   Lives at home with his wife and step-children.   Right-handed.   3/4 - 1 two liter of soda per day.      Allergies:   Allergies  Allergen  Reactions  . Flexeril [Cyclobenzaprine] Other (See Comments)    "seizures"    Metabolic Disorder Labs: No results found for: HGBA1C, MPG No results found for: PROLACTIN No results found for: CHOL, TRIG, HDL, CHOLHDL, VLDL, LDLCALC   Current Medications: Current Outpatient Medications  Medication Sig Dispense Refill  . busPIRone (BUSPAR) 7.5 MG tablet Take 1 tablet (7.5 mg total) by mouth 2 (two) times daily as needed. 45 tablet 0  . Cholecalciferol (VITAMIN D PO) Take 25 Units by mouth daily.    . DULoxetine (CYMBALTA) 30 MG capsule Take 1 capsule (30 mg total) by mouth daily. 30 capsule 2  . ibuprofen (ADVIL,MOTRIN) 200 MG tablet Take 200 mg by mouth daily as needed.     No current facility-administered medications for this visit.       Psychiatric Specialty Exam: Review of Systems  Cardiovascular: Negative for chest pain and palpitations.  Musculoskeletal: Positive for back pain.  Skin: Negative for itching.  Psychiatric/Behavioral: Negative for depression and substance abuse.    Blood pressure (!) 144/92, pulse 95, height  (1.778 m), weight 255 lb (115.7 kg).Body mass index is 36.59 kg/m.  General Appearance: Casual  Eye Contact:  Fair  Speech:  Slow  Volume:  Decreased  Mood: fair  Affect:  congruent  Thought Process:  Goal Directed  Orientation:  Full (Time, Place, and Person)  Thought Content:  Logical  Suicidal Thoughts:  No  Homicidal Thoughts:  No  Memory:  Immediate;   Fair Recent;   Fair  Judgement:  Fair  Insight:  Fair  Psychomotor Activity:  Decreased  Concentration:  Concentration: Fair and Attention Span: Fair  Recall:  [employer] of Knowledge:Fair  Language: Fair  Akathisia:  Negative  Handed:  Right  AIMS (if indicated):    Assets:  Desire for Improvement  ADL's:  Intact  Cognition: WNL  Sleep:  poor    Treatment Plan Summary: Medication management and Plan as follows  1. Major depression vs dysthymia:fari. Continue cymbalta 2.  GAD; less stressed. Continue meds cymbalta 3. Panic disorder;sporadic. contnue cymbalta 4. InsomniaL:not worse  fu3 m. Renew cymbalta    Thresa Ross, MD 5/28/20198:43 AM

## 2017-09-30 ENCOUNTER — Ambulatory Visit (HOSPITAL_COMMUNITY): Payer: Self-pay | Admitting: Licensed Clinical Social Worker

## 2018-01-25 ENCOUNTER — Ambulatory Visit (HOSPITAL_COMMUNITY): Payer: Self-pay | Admitting: Psychiatry

## 2018-01-27 ENCOUNTER — Encounter (HOSPITAL_COMMUNITY): Payer: Self-pay | Admitting: Psychiatry

## 2018-01-27 ENCOUNTER — Ambulatory Visit (INDEPENDENT_AMBULATORY_CARE_PROVIDER_SITE_OTHER): Payer: 59 | Admitting: Psychiatry

## 2018-01-27 VITALS — BP 126/85 | HR 77 | Ht 70.0 in | Wt 257.0 lb

## 2018-01-27 DIAGNOSIS — F341 Dysthymic disorder: Secondary | ICD-10-CM

## 2018-01-27 DIAGNOSIS — F17210 Nicotine dependence, cigarettes, uncomplicated: Secondary | ICD-10-CM

## 2018-01-27 DIAGNOSIS — F411 Generalized anxiety disorder: Secondary | ICD-10-CM

## 2018-01-27 DIAGNOSIS — F410 Panic disorder [episodic paroxysmal anxiety] without agoraphobia: Secondary | ICD-10-CM

## 2018-01-27 DIAGNOSIS — F5102 Adjustment insomnia: Secondary | ICD-10-CM | POA: Diagnosis not present

## 2018-01-27 MED ORDER — DULOXETINE HCL 30 MG PO CPEP: 30.0000 mg | ORAL_CAPSULE | Freq: Every day | ORAL | 2 refills | Status: DC

## 2018-01-27 NOTE — Progress Notes (Signed)
[name] Outpatient Follow  Up visit  Patient Identification: Eduardo Barnes MRN:  [id number] Date of Evaluation:  01/27/2018 Referral Source: primary care Chief Complaint:   Chief Complaint    Follow-up; Medication Refill     Visit Diagnosis:  No diagnosis found.  History of Present Illness:  42 years old [name] referred initially for management of mood and anxiety  Doing fair. 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 Parents were going thru divorce  Previous Psychotropic Medications: No   Substance Abuse History in the last 12 months:  No.  Consequences of Substance Abuse: NA  Past Medical History:  Past Medical History:  Diagnosis Date  . Arthritis    "probably a touch in my back" (11/15/2014)  . Chronic lower back pain   . DDD (degenerative disc disease), lumbar   . Diverticulitis Dec 2016  . Neck pain   . Seizure (HCC)    had 1 seizure after back surgery from "too many muscle relaxers;.On no meds and no more seizure    Past Surgical History:  Procedure Laterality Date  . ANTERIOR CERVICAL DECOMP/DISCECTOMY FUSION N/A 08/30/2014   Procedure: ANTERIOR CERVICAL DECOMPRESSION/DISCECTOMY FUSION CERVICAL FIVE-SIX,CERVICAL SIX-SEVEN;  Surgeon: Tia Alert, MD;  Location: [facility];  Service: Neurosurgery;  Laterality: N/A;  . COLONOSCOPY WITH PROPOFOL N/A 05/13/2015   Procedure: COLONOSCOPY WITH PROPOFOL;  Surgeon: Corbin Ade, MD;  Location: [facility];  Service: Endoscopy;  Laterality: N/A;  1030  . POSTERIOR LUMBAR FUSION  11/15/2014  . TONSILLECTOMY      Family Psychiatric History: depression and anxiety: mom   Family History:  Family History  Problem Relation Age of Onset  . Heart disease Mother   .  Seizures Mother   . Migraines Mother   . Heart attack Father   . Colon cancer Neg Hx        Thinks his Aunt may have been diagnosed but no first degree relatives    Social History:   Social History   Socioeconomic History  . Marital status: Married    Spouse name: Not on file  . Number of children: 0  . Years of education: GED  . Highest education level: Not on file  Occupational History  . Occupation: Unemployed  Social Needs  . Financial resource strain: Not on file  . Food insecurity:    Worry: Not on file    Inability: Not on file  . Transportation needs:    Medical: Not on file    Non-medical: Not on file  Tobacco Use  . Smoking status: Current Every Day Smoker    Packs/day: 0.50    Years: 24.00    Pack years: 12.00    Types: Cigarettes  . Smokeless tobacco: Never Used  . Tobacco comment: 11/15/2014 "down from 1 1/2 ppd in 08/2014"  Substance and Sexual Activity  . Alcohol use: No    Alcohol/week: 0.0 standard drinks    Frequency: Never  . Drug use: No  . Sexual activity: Yes    Birth control/protection: Other-see comments    Comment: Wife had tubaligation  Lifestyle  . Physical activity:    Days per week: Not on file    Minutes per session: Not on file  .  Stress: Not on file  Relationships  . Social connections:    Talks on phone: Not on file    Gets together: Not on file    Attends religious service: Not on file    Active member of club or organization: Not on file    Attends meetings of clubs or organizations: Not on file    Relationship status: Not on file  Other Topics Concern  . Not on file  Social History Narrative   Lives at home with his wife and step-children.   Right-handed.   3/4 - 1 two liter of soda per day.      Allergies:   Allergies  Allergen Reactions  . Flexeril [Cyclobenzaprine] Other (See Comments)    "seizures"    Metabolic Disorder Labs: No results found for: HGBA1C, MPG No results found for: PROLACTIN No results  found for: CHOL, TRIG, HDL, CHOLHDL, VLDL, LDLCALC   Current Medications: Current Outpatient Medications  Medication Sig Dispense Refill  . DULoxetine (CYMBALTA) 30 MG capsule Take 1 capsule (30 mg total) by mouth daily. 30 capsule 2  . ibuprofen (ADVIL,MOTRIN) 200 MG tablet Take 200 mg by mouth daily as needed.    . Cholecalciferol (VITAMIN D PO) Take 25 Units by mouth daily.     No current facility-administered medications for this visit.       Psychiatric Specialty Exam: Review of Systems  Cardiovascular: Negative for chest pain and palpitations.  Musculoskeletal: Positive for back pain.  Skin: Negative for itching.  Psychiatric/Behavioral: Negative for depression and substance abuse.    Blood pressure 126/85, pulse 77, height 5\' 10"  (1.778 m), weight 257 lb (116.6 kg), SpO2 95 %.Body mass index is 36.88 kg/m.  General Appearance: Casual  Eye Contact:  Fair  Speech:  Slow  Volume:  Decreased  Mood: fair  Affect:  congruent  Thought Process:  Goal Directed  Orientation:  Full (Time, Place, and Person)  Thought Content:  Logical  Suicidal Thoughts:  No  Homicidal Thoughts:  No  Memory:  Immediate;   Fair Recent;   Fair  Judgement:  Fair  Insight:  Fair  Psychomotor Activity:  Decreased  Concentration:  Concentration: Fair and Attention Span: Fair  Recall:  [employer] of Knowledge:Fair  Language: Fair  Akathisia:  Negative  Handed:  Right  AIMS (if indicated):    Assets:  Desire for Improvement  ADL's:  Intact  Cognition: WNL  Sleep:  poor    Treatment Plan Summary: Medication management and Plan as follows  1. Major depression vs dysthymia: stable. Continue cymbalta 2. GAD; less stressed. Continue meds cymbalta 3. Panic disorder;sporadic. contnue cymbalta 4. InsomniaL:not worse  fu3 m. Renew cymbalta    Thresa Ross, MD 9/26/20198:55 AM

## 2018-05-30 ENCOUNTER — Ambulatory Visit (HOSPITAL_COMMUNITY): Payer: 59 | Admitting: Psychiatry

## 2018-06-02 ENCOUNTER — Other Ambulatory Visit: Payer: Self-pay

## 2018-06-02 ENCOUNTER — Encounter (HOSPITAL_COMMUNITY): Payer: Self-pay | Admitting: Psychiatry

## 2018-06-02 ENCOUNTER — Ambulatory Visit (INDEPENDENT_AMBULATORY_CARE_PROVIDER_SITE_OTHER): Payer: 59 | Admitting: Psychiatry

## 2018-06-02 VITALS — BP 130/90 | HR 100 | Ht 70.0 in | Wt 250.0 lb

## 2018-06-02 DIAGNOSIS — F5102 Adjustment insomnia: Secondary | ICD-10-CM | POA: Diagnosis not present

## 2018-06-02 DIAGNOSIS — F410 Panic disorder [episodic paroxysmal anxiety] without agoraphobia: Secondary | ICD-10-CM | POA: Diagnosis not present

## 2018-06-02 DIAGNOSIS — F341 Dysthymic disorder: Secondary | ICD-10-CM

## 2018-06-02 DIAGNOSIS — F411 Generalized anxiety disorder: Secondary | ICD-10-CM | POA: Diagnosis not present

## 2018-06-02 MED ORDER — ESCITALOPRAM OXALATE 10 MG PO TABS: 10.0000 mg | ORAL_TABLET | Freq: Every day | ORAL | 1 refills | Status: DC

## 2018-06-02 NOTE — Progress Notes (Signed)
[facility] Outpatient Follow  Up visit  Patient Identification: Eduardo Barnes MRN:  [id number] Date of Evaluation:  06/02/2018 Referral Source: primary care Chief Complaint:   Chief Complaint    Follow-up; Other     Visit Diagnosis:    ICD-10-CM   1. 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 GAD (generalized anxiety disorder) F41.1   3. Adjustment insomnia F51.02   4. 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 Parents were going thru divorce  Previous Psychotropic Medications: No   Substance Abuse History in the last 12 months:  No.  Consequences of Substance Abuse: NA  Past Medical History:  Past Medical History:  Diagnosis Date  . Arthritis    "probably a touch in my back" (11/15/2014)  . Chronic lower back pain   . DDD (degenerative disc disease), lumbar   . Diverticulitis Dec 2016  . Neck pain   . Seizure (HCC)    had 1 seizure after back surgery from "too many muscle relaxers;.On no meds and no more seizure    Past Surgical History:  Procedure Laterality Date  . ANTERIOR CERVICAL DECOMP/DISCECTOMY FUSION N/A 08/30/2014   Procedure: ANTERIOR CERVICAL DECOMPRESSION/DISCECTOMY FUSION CERVICAL FIVE-SIX,CERVICAL SIX-SEVEN;  Surgeon: Tia Alert, MD;  Location: [facility];  Service: Neurosurgery;  Laterality: N/A;  . COLONOSCOPY WITH PROPOFOL N/A 05/13/2015   Procedure: COLONOSCOPY WITH PROPOFOL;  Surgeon: Corbin Ade, MD;  Location: [facility];  Service:  Endoscopy;  Laterality: N/A;  1030  . POSTERIOR LUMBAR FUSION  11/15/2014  . TONSILLECTOMY      Family Psychiatric History: depression and anxiety: mom   Family History:  Family History  Problem Relation Age of Onset  . Heart disease Mother   . Seizures Mother   . Migraines Mother   . Heart attack Father   . Colon cancer Neg Hx        Thinks his Aunt may have been diagnosed but no first degree relatives    Social History:   Social History   Socioeconomic History  . Marital status: Married    Spouse name: Not on file  . Number of children: 0  . Years of education: GED  . Highest education level: Not on file  Occupational History  . Occupation: Unemployed  Social Needs  . Financial resource strain: Not on file  . Food insecurity:    Worry: Not on file    Inability: Not on file  . Transportation needs:    Medical: Not on file    Non-medical: Not on file  Tobacco Use  . Smoking status: Current Every Day Smoker    Packs/day: 0.50    Years: 24.00    Pack years: 12.00    Types: Cigarettes  . Smokeless tobacco: Never Used  . Tobacco comment: 11/15/2014 "down from 1 1/2 ppd in 08/2014"  Substance and Sexual Activity  . Alcohol use: No    Alcohol/week: 0.0 standard drinks    Frequency:  Never  . Drug use: No  . Sexual activity: Yes    Birth control/protection: Other-see comments    Comment: Wife had tubaligation  Lifestyle  . Physical activity:    Days per week: Not on file    Minutes per session: Not on file  . Stress: Not on file  Relationships  . Social connections:    Talks on phone: Not on file    Gets together: Not on file    Attends religious service: Not on file    Active member of club or organization: Not on file    Attends meetings of clubs or organizations: Not on file    Relationship status: Not on file  Other Topics Concern  . Not on file  Social History Narrative   Lives at home with his wife and step-children.   Right-handed.   3/4 - 1 two  liter of soda per day.      Allergies:   Allergies  Allergen Reactions  . Flexeril [Cyclobenzaprine] Other (See Comments)    "seizures"    Metabolic Disorder Labs: No results found for: HGBA1C, MPG No results found for: PROLACTIN No results found for: CHOL, TRIG, HDL, CHOLHDL, VLDL, LDLCALC   Current Medications: Current Outpatient Medications  Medication Sig Dispense Refill  . Cholecalciferol (VITAMIN D PO) Take 25 Units by mouth daily.    Marland Kitchen. ibuprofen (ADVIL,MOTRIN) 200 MG tablet Take 200 mg by mouth daily as needed.    Marland Kitchen. escitalopram (LEXAPRO) 10 MG tablet Take 1 tablet (10 mg total) by mouth daily. 30 tablet 1   No current facility-administered medications for this visit.       Psychiatric Specialty Exam: Review of Systems  Cardiovascular: Negative for chest pain and palpitations.  Musculoskeletal: Positive for back pain.  Skin: Negative for itching.  Psychiatric/Behavioral: Negative for depression and substance abuse.    Blood pressure 130/90, pulse 100, height 5\' 10"  (1.778 m), weight 250 lb (113.4 kg).Body mass index is 35.87 kg/m.  General Appearance: Casual  Eye Contact:  Fair  Speech:  Slow  Volume:  Decreased  Mood:fair  Affect:  congruent  Thought Process:  Goal Directed  Orientation:  Full (Time, Place, and Person)  Thought Content:  Logical  Suicidal Thoughts:  No  Homicidal Thoughts:  No  Memory:  Immediate;   Fair Recent;   Fair  Judgement:  Fair  Insight:  Fair  Psychomotor Activity:  Decreased  Concentration:  Concentration: Fair and Attention Span: Fair  Recall:  [employer]  Fund of Knowledge:Fair  Language: Fair  Akathisia:  Negative  Handed:  Right  AIMS (if indicated):    Assets:  Desire for Improvement  ADL's:  Intact  Cognition: WNL  Sleep:  poor    Treatment Plan Summary: Medication management and Plan as follows  1. 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 GAD;  less stressed. Change to lexapro as above 3. Panic disorder;sporadic, change to lexapro as above 4. InsomniaL:not worse  Call back in 10 days to review concern and make fu 3448m.     Thresa RossNadeem Olita Takeshita, MD 1/30/202010:47 AM

## 2018-06-06 ENCOUNTER — Telehealth (HOSPITAL_COMMUNITY): Payer: Self-pay | Admitting: Psychiatry

## 2018-06-06 MED ORDER — HYDROXYZINE HCL 10 MG PO TABS: 10.0000 mg | ORAL_TABLET | Freq: Two times a day (BID) | ORAL | 0 refills | Status: DC

## 2018-06-06 NOTE — Telephone Encounter (Signed)
Have tried 2 meds. Can take vistaril 10mg  bid if needed for anxiety. Should get gene testing to assess as well.

## 2018-06-06 NOTE — Telephone Encounter (Signed)
Informed patient that per Dr. Gilmore Laroche he can start taking vistaril 10mg  bid. I also told him to come in so we can do a Gene[employer]ight testing and he told me he would. I sent a one month supply of vistaril to the pharmacy.

## 2018-06-06 NOTE — Telephone Encounter (Signed)
Pt took lexapro on Saturday. It seemed to have sent him into a panic attack and he hasn't taken it since. He would like a different medication.  Please advise.   cb # [id number]

## 2018-06-29 ENCOUNTER — Other Ambulatory Visit (HOSPITAL_COMMUNITY): Payer: Self-pay | Admitting: Psychiatry

## 2018-07-28 ENCOUNTER — Encounter (HOSPITAL_COMMUNITY): Payer: Self-pay | Admitting: Psychiatry

## 2018-07-28 ENCOUNTER — Other Ambulatory Visit: Payer: Self-pay

## 2018-07-28 ENCOUNTER — Ambulatory Visit (INDEPENDENT_AMBULATORY_CARE_PROVIDER_SITE_OTHER): Payer: 59 | Admitting: Psychiatry

## 2018-07-28 DIAGNOSIS — F341 Dysthymic disorder: Secondary | ICD-10-CM

## 2018-07-28 DIAGNOSIS — Z79899 Other long term (current) drug therapy: Secondary | ICD-10-CM

## 2018-07-28 DIAGNOSIS — F411 Generalized anxiety disorder: Secondary | ICD-10-CM | POA: Diagnosis not present

## 2018-07-28 DIAGNOSIS — F5102 Adjustment insomnia: Secondary | ICD-10-CM

## 2018-07-28 DIAGNOSIS — F410 Panic disorder [episodic paroxysmal anxiety] without agoraphobia: Secondary | ICD-10-CM

## 2018-07-28 MED ORDER — GABAPENTIN 100 MG PO CAPS: 100.0000 mg | ORAL_CAPSULE | Freq: Three times a day (TID) | ORAL | 1 refills | Status: DC

## 2018-07-28 NOTE — Progress Notes (Signed)
[facility] phone visit  Patient Identification: Eduardo Barnes MRN:  [id number] Date of Evaluation:  07/28/2018 Referral Source: primary care Chief Complaint:    Visit Diagnosis:    ICD-10-CM   1. 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 GAD (generalized anxiety disorder) F41.1   3. Adjustment insomnia F51.02   4. Panic disorder F41.0     History of Present Illness:  43 years old [name] referred initially for management of mood and anxiety On disability due to back condition.  I connected with Pollyann Glen on 07/28/18 at 10:30 AM EDT by telephone and verified that I am speaking with the correct person using two identifiers. Phone visit done due to covid restrictions and waiver granted.   I discussed the limitations, risks, security and privacy concerns of performing an evaluation and management service by telephone and the availability of in person appointments. I also discussed with the patient that there may be a patient responsible charge related to this service. The patient expressed understanding and agreed to proceed.  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 Parents were going thru divorce  Previous Psychotropic Medications: No   Substance Abuse History in the last 12 months:  No.  Consequences of Substance Abuse: NA  Past Medical History:  Past Medical History:  Diagnosis Date  . Arthritis    "probably a touch in my back" (11/15/2014)  . Chronic lower back pain   . DDD (degenerative disc disease), lumbar   . Diverticulitis Dec 2016  . Neck pain   . Seizure ([facility]CC)    had  1 seizure after back surgery from "too many muscle relaxers;.On no meds and no more seizure    Past Surgical History:  Procedure Laterality Date  . ANTERIOR CERVICAL DECOMP/DISCECTOMY FUSION N/A 08/30/2014   Procedure: ANTERIOR CERVICAL DECOMPRESSION/DISCECTOMY FUSION CERVICAL FIVE-SIX,CERVICAL SIX-SEVEN;  Surgeon: Tia Alert, MD;  Location: [facility];  Service: Neurosurgery;  Laterality: N/A;  . COLONOSCOPY WITH PROPOFOL N/A 05/13/2015   Procedure: COLONOSCOPY WITH PROPOFOL;  Surgeon: Corbin Ade, MD;  Location: [facility];  Service: Endoscopy;  Laterality: N/A;  1030  . POSTERIOR LUMBAR FUSION  11/15/2014  . TONSILLECTOMY      Family Psychiatric History: depression and anxiety: mom   Family History:  Family History  Problem Relation Age of Onset  . Heart disease Mother   . Seizures Mother   . Migraines Mother   . Heart attack Father   . Colon cancer Neg Hx        Thinks his Aunt may have been diagnosed but no first degree relatives    Social History:   Social History   Socioeconomic History  . Marital status: Married    Spouse name: Not on file  . Number of children: 0  . Years of education: GED  . Highest education level: Not on file  Occupational History  . Occupation: Unemployed  Social Needs  . Financial resource strain: Not on file  . Food insecurity:    Worry: Not on file    Inability: Not on file  . Transportation needs:  Medical: Not on file    Non-medical: Not on file  Tobacco Use  . Smoking status: Current Every Day Smoker    Packs/day: 0.50    Years: 24.00    Pack years: 12.00    Types: Cigarettes  . Smokeless tobacco: Never Used  . Tobacco comment: 11/15/2014 "down from 1 1/2 ppd in 08/2014"  Substance and Sexual Activity  . Alcohol use: No    Alcohol/week: 0.0 standard drinks    Frequency: Never  . Drug use: No  . Sexual activity: Yes    Birth control/protection: Other-see comments    Comment: Wife had tubaligation  Lifestyle  .  Physical activity:    Days per week: Not on file    Minutes per session: Not on file  . Stress: Not on file  Relationships  . Social connections:    Talks on phone: Not on file    Gets together: Not on file    Attends religious service: Not on file    Active member of club or organization: Not on file    Attends meetings of clubs or organizations: Not on file    Relationship status: Not on file  Other Topics Concern  . Not on file  Social History Narrative   Lives at home with his wife and step-children.   Right-handed.   3/4 - 1 two liter of soda per day.      Allergies:   Allergies  Allergen Reactions  . Flexeril [Cyclobenzaprine] Other (See Comments)    "seizures"    Metabolic Disorder Labs: No results found for: HGBA1C, MPG No results found for: PROLACTIN No results found for: CHOL, TRIG, HDL, CHOLHDL, VLDL, LDLCALC   Current Medications: Current Outpatient Medications  Medication Sig Dispense Refill  . Cholecalciferol (VITAMIN D PO) Take 25 Units by mouth daily.    Marland Kitchen escitalopram (LEXAPRO) 10 MG tablet Take 1 tablet (10 mg total) by mouth daily. 30 tablet 1  . gabapentin (NEURONTIN) 100 MG capsule Take 1 capsule (100 mg total) by mouth 3 (three) times daily. 90 capsule 1  . hydrOXYzine (ATARAX/VISTARIL) 10 MG tablet TAKE 1 TABLET BY MOUTH TWICE A DAY AS NEEDED FOR ANXIETY 60 tablet 0  . ibuprofen (ADVIL,MOTRIN) 200 MG tablet Take 200 mg by mouth daily as needed.     No current facility-administered medications for this visit.       Psychiatric Specialty Exam: Review of Systems  Cardiovascular: Negative for chest pain and palpitations.  Musculoskeletal: Positive for back pain.  Skin: Negative for itching.  Psychiatric/Behavioral: Negative for depression and substance abuse.    There were no vitals taken for this visit.There is no height or weight on file to calculate BMI.  General Appearance:   Eye Contact:   Speech:  Slow  Volume:  Decreased   Mood:somewhat stressed  Affect:  congruent  Thought Process:  Goal Directed  Orientation:  Full (Time, Place, and Person)  Thought Content:  Logical  Suicidal Thoughts:  No  Homicidal Thoughts:  No  Memory:  Immediate;   Fair Recent;   Fair  Judgement:  Fair  Insight:  Fair  Psychomotor Activity:  Concentration:  Concentration: Fair and Attention Span: Fair  Recall:  [employer] of Knowledge:Fair  Language: Fair  Akathisia:    Handed:  Right  AIMS (if indicated):    Assets:  Desire for Improvement  ADL's:  Intact  Cognition: WNL  Sleep:  poor    Treatment Plan Summary: Medication management and Plan  as follows  1. Major depression vs dysthymia: not worse, more worried about anxiety 2. GAD; stressed, restart buspar once or twice a day has med, add gabapentin 100mg  tid till swab test done and if need can change 3. Panic disorder;sporadic, add gabapentin. Has tried ssri  4. InsomniaL:not worse  I discussed the assessment and treatment plan with the patient. The patient was provided an opportunity to ask questions and all were answered. The patient agreed with the plan and demonstrated an understanding of the instructions.   The patient was advised to call back or seek an in-person evaluation if the symptoms worsen or if the condition fails to improve as anticipated.  I provided 15 minutes of non-face-to-face time during this encounter. Fu 3 w or earlier if needed    Thresa RossNadeem Maddax Palinkas, MD 3/26/202010:37 AM

## 2018-10-31 ENCOUNTER — Telehealth (HOSPITAL_COMMUNITY): Payer: Self-pay | Admitting: Psychiatry

## 2018-10-31 MED ORDER — HYDROXYZINE HCL 10 MG PO TABS: ORAL_TABLET | ORAL | 0 refills | Status: DC

## 2018-10-31 MED ORDER — GABAPENTIN 100 MG PO CAPS: 100.0000 mg | ORAL_CAPSULE | Freq: Three times a day (TID) | ORAL | 0 refills | Status: DC

## 2018-10-31 NOTE — Telephone Encounter (Signed)
Crystal; Ok to fill

## 2018-10-31 NOTE — Telephone Encounter (Signed)
Pt needs refill on Gabapentin and Hydroxyzine.  [facility] [place]

## 2018-10-31 NOTE — Telephone Encounter (Signed)
Sent in a one month supply to both meds per Dr. De Nurse

## 2018-11-01 ENCOUNTER — Encounter (HOSPITAL_COMMUNITY): Payer: Self-pay | Admitting: Psychiatry

## 2018-11-01 ENCOUNTER — Ambulatory Visit (INDEPENDENT_AMBULATORY_CARE_PROVIDER_SITE_OTHER): Payer: 59 | Admitting: Psychiatry

## 2018-11-01 DIAGNOSIS — F5102 Adjustment insomnia: Secondary | ICD-10-CM | POA: Diagnosis not present

## 2018-11-01 DIAGNOSIS — F341 Dysthymic disorder: Secondary | ICD-10-CM

## 2018-11-01 DIAGNOSIS — F411 Generalized anxiety disorder: Secondary | ICD-10-CM | POA: Diagnosis not present

## 2018-11-01 NOTE — Progress Notes (Signed)
[facility]  Patient Identification: Eduardo Barnes MRN:  [id number] Date of Evaluation:  11/01/2018 Referral Source: primary care Chief Complaint:   depression follow up  Visit Diagnosis:    ICD-10-CM   1. 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 GAD (generalized anxiety disorder)  F41.1   3. Adjustment insomnia  F51.02     History of Present Illness:  43 years old [name] referred initially for management of mood and anxiety On disability due to back condition.  I connected with Eduardo GlenSteven T Dekker on 11/01/18 at  8:30 AM EDT by telephone and verified that I am speaking with the correct person using two identifiers. I discussed the limitations, risks, security and privacy concerns of performing an evaluation and management service by telephone and the availability of in person appointments. I also discussed with the patient that there may be a patient responsible charge related to this service. The patient expressed understanding and agreed to proceed.  Doing better on gabapentin, lexapro didn't help. 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 Parents were going thru divorce  Previous Psychotropic Medications: No   Substance Abuse History in the last 12 months:  No.  Consequences of Substance Abuse: NA  Past Medical History:  Past Medical History:  Diagnosis Date  . Arthritis    "probably a touch in my back" (11/15/2014)  . Chronic lower back pain   . DDD (degenerative disc disease), lumbar   . Diverticulitis Dec 2016  . Neck pain   . Seizure (HCC)    had 1 seizure after back surgery from "too many muscle relaxers;.On no meds and no more seizure    Past Surgical History:  Procedure  Laterality Date  . ANTERIOR CERVICAL DECOMP/DISCECTOMY FUSION N/A 08/30/2014   Procedure: ANTERIOR CERVICAL DECOMPRESSION/DISCECTOMY FUSION CERVICAL FIVE-SIX,CERVICAL SIX-SEVEN;  Surgeon: Tia Alertavid S Jones, MD;  Location: [facility];  Service: Neurosurgery;  Laterality: N/A;  . COLONOSCOPY WITH PROPOFOL N/A 05/13/2015   Procedure: COLONOSCOPY WITH PROPOFOL;  Surgeon: Corbin Adeobert M Rourk, MD;  Location[facility];  Service: Endoscopy;  Laterality: N/A;  1030  . POSTERIOR LUMBAR FUSION  11/15/2014  . TONSILLECTOMY      Family Psychiatric History: depression and anxiety: mom   Family History:  Family History  Problem Relation Age of Onset  . Heart disease Mother   . Seizures Mother   . Migraines Mother   . Heart attack Father   . Colon cancer Neg Hx        Thinks his Aunt may have been diagnosed but no first degree relatives    Social History:   Social History   Socioeconomic History  . Marital status: Married    Spouse name: Not on file  . Number of children: 0  . Years of education: GED  . Highest education level: Not on file  Occupational History  . Occupation: Unemployed  Social Needs  . Financial resource strain: Not on file  . Food insecurity    Worry: Not on file    Inability: Not on file  . Transportation needs    Medical: Not on file    Non-medical: Not on file  Tobacco Use  . Smoking status: Current Every Day  Smoker    Packs/day: 0.50    Years: 24.00    Pack years: 12.00    Types: Cigarettes  . Smokeless tobacco: Never Used  . Tobacco comment: 11/15/2014 "down from 1 1/2 ppd in 08/2014"  Substance and Sexual Activity  . Alcohol use: No    Alcohol/week: 0.0 standard drinks    Frequency: Never  . Drug use: No  . Sexual activity: Yes    Birth control/protection: Other-see comments    Comment: Wife had tubaligation  Lifestyle  . Physical activity    Days per week: Not on file    Minutes per session: Not on file  . Stress: Not on file  Relationships  . Social  Musicianconnections    Talks on phone: Not on file    Gets together: Not on file    Attends religious service: Not on file    Active member of club or organization: Not on file    Attends meetings of clubs or organizations: Not on file    Relationship status: Not on file  Other Topics Concern  . Not on file  Social History Narrative   Lives at home with his wife and step-children.   Right-handed.   3/4 - 1 two liter of soda per day.      Allergies:   Allergies  Allergen Reactions  . Flexeril [Cyclobenzaprine] Other (See Comments)    "seizures"    Metabolic Disorder Labs: No results found for: HGBA1C, MPG No results found for: PROLACTIN No results found for: CHOL, TRIG, HDL, CHOLHDL, VLDL, LDLCALC   Current Medications: Current Outpatient Medications  Medication Sig Dispense Refill  . Cholecalciferol (VITAMIN D PO) Take 25 Units by mouth daily.    Marland Kitchen. gabapentin (NEURONTIN) 100 MG capsule Take 1 capsule (100 mg total) by mouth 3 (three) times daily. 90 capsule 0  . hydrOXYzine (ATARAX/VISTARIL) 10 MG tablet TAKE 1 TABLET BY MOUTH TWICE A DAY AS NEEDED FOR ANXIETY 60 tablet 0  . ibuprofen (ADVIL,MOTRIN) 200 MG tablet Take 200 mg by mouth daily as needed.     No current facility-administered medications for this visit.       Psychiatric Specialty Exam: Review of Systems  Cardiovascular: Negative for chest pain and palpitations.  Skin: Negative for itching.  Psychiatric/Behavioral: Negative for depression and substance abuse.    There were no vitals taken for this visit.There is no height or weight on file to calculate BMI.  General Appearance:   Eye Contact:   Speech:  Slow  Volume:  Decreased  Mood:better  Affect:  congruent  Thought Process:  Goal Directed  Orientation:  Full (Time, Place, and Person)  Thought Content:  Logical  Suicidal Thoughts:  No  Homicidal Thoughts:  No  Memory:  Immediate;   Fair Recent;   Fair  Judgement:  Fair  Insight:  Fair   Psychomotor Activity:  Concentration:  Concentration: Fair and Attention Span: Fair  Recall:  [employer]  Fund of Knowledge:Fair  Language: Fair  Akathisia:    Handed:  Right  AIMS (if indicated):    Assets:  Desire for Improvement  ADL's:  Intact  Cognition: WNL  Sleep:  poor    Treatment Plan Summary: Medication management and Plan as follows  1. Major depression vs dysthymia: not worse, continue gaba 2. GAD; less stressed gabapentin helping, not took buspar  3. Panic disorder;sporadic,better on gabapentin and vistaril, continue 4. InsomniaL:not worse Call for refills as one sent yesterday I discussed the assessment and treatment plan  with the patient. The patient was provided an opportunity to ask questions and all were answered. The patient agreed with the plan and demonstrated an understanding of the instructions.   The patient was advised to call back or seek an in-person evaluation if the symptoms worsen or if the condition fails to improve as anticipated.  I provided 15 minutes of non-face-to-face time during this encounter. Fu 2-57m.     Merian Capron, MD 6/30/20208:44 AM

## 2018-11-23 ENCOUNTER — Other Ambulatory Visit (HOSPITAL_COMMUNITY): Payer: Self-pay | Admitting: Psychiatry

## 2018-12-05 ENCOUNTER — Telehealth (HOSPITAL_COMMUNITY): Payer: Self-pay | Admitting: Psychiatry

## 2018-12-05 NOTE — Telephone Encounter (Signed)
Pt needs refill on gabapentin and [facility] [place]

## 2018-12-06 MED ORDER — GABAPENTIN 100 MG PO CAPS: 100.0000 mg | ORAL_CAPSULE | Freq: Three times a day (TID) | ORAL | 0 refills | Status: DC

## 2018-12-06 NOTE — Telephone Encounter (Signed)
Sent one month supply of gabapentin. Patient should have hydroxyzine on file at the pharmacy already.

## 2019-01-09 ENCOUNTER — Telehealth (HOSPITAL_COMMUNITY): Payer: Self-pay | Admitting: Psychiatry

## 2019-01-12 NOTE — Telephone Encounter (Signed)
Gaba refill sent

## 2019-01-12 NOTE — Telephone Encounter (Signed)
sent 

## 2019-01-31 ENCOUNTER — Other Ambulatory Visit: Payer: Self-pay

## 2019-01-31 ENCOUNTER — Ambulatory Visit (HOSPITAL_COMMUNITY): Payer: 59 | Admitting: Psychiatry

## 2019-02-10 ENCOUNTER — Ambulatory Visit (INDEPENDENT_AMBULATORY_CARE_PROVIDER_SITE_OTHER): Payer: 59 | Admitting: Psychiatry

## 2019-02-10 ENCOUNTER — Encounter (HOSPITAL_COMMUNITY): Payer: Self-pay | Admitting: Psychiatry

## 2019-02-10 DIAGNOSIS — F411 Generalized anxiety disorder: Secondary | ICD-10-CM | POA: Diagnosis not present

## 2019-02-10 DIAGNOSIS — F341 Dysthymic disorder: Secondary | ICD-10-CM

## 2019-02-10 DIAGNOSIS — F5102 Adjustment insomnia: Secondary | ICD-10-CM | POA: Diagnosis not present

## 2019-02-10 DIAGNOSIS — F410 Panic disorder [episodic paroxysmal anxiety] without agoraphobia: Secondary | ICD-10-CM | POA: Diagnosis not present

## 2019-02-10 MED ORDER — GABAPENTIN 100 MG PO CAPS: ORAL_CAPSULE | ORAL | 3 refills | Status: DC

## 2019-02-10 MED ORDER — HYDROXYZINE HCL 10 MG PO TABS: ORAL_TABLET | ORAL | 3 refills | Status: DC

## 2019-02-10 NOTE — Progress Notes (Signed)
[facility] phone visit  Patient Identification: Eduardo Barnes MRN:  [id number] Date of Evaluation:  02/10/2019 Referral Source: primary care Chief Complaint:   depression follow up  Visit Diagnosis:    ICD-10-CM   1. 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 GAD (generalized anxiety disorder)  F41.1   3. Adjustment insomnia  F51.02   4. Panic disorder  F41.0     History of Present Illness:  43 years old [name] referred initially for management of mood and anxiety On disability due to back condition.   I connected with Glean Salvo on 02/10/19 at  8:30 AM EDT by telephone and verified that I am speaking with the correct person using two identifiers. I discussed the limitations, risks, security and privacy concerns of performing an evaluation and management service by telephone and the availability of in person appointments. I also discussed with the patient that there may be a patient responsible charge related to this service. The patient expressed understanding and agreed to proceed.  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 Parents were going thru divorce  Previous Psychotropic Medications: No   Substance Abuse History in the last 12 months:  No.  Consequences of Substance Abuse: NA  Past Medical History:  Past Medical History:  Diagnosis Date  . Arthritis    "probably a touch in my back" (11/15/2014)  . Chronic lower back pain   . DDD (degenerative disc disease), lumbar   . Diverticulitis Dec 2016  . Neck pain   . Seizure ([facility])    had 1 seizure after back surgery from "too many muscle relaxers;.On no meds and no more seizure    Past Surgical History:  Procedure Laterality Date  .  ANTERIOR CERVICAL DECOMP/DISCECTOMY FUSION N/A 08/30/2014   Procedure: ANTERIOR CERVICAL DECOMPRESSION/DISCECTOMY FUSION CERVICAL FIVE-SIX,CERVICAL SIX-SEVEN;  Surgeon: Eustace Moore, MD;  Location: [facility];  Service: Neurosurgery;  Laterality: N/A;  . COLONOSCOPY WITH PROPOFOL N/A 05/13/2015   Procedure: COLONOSCOPY WITH PROPOFOL;  Surgeon: Daneil Dolin, MD;  [facility];  Service: Endoscopy;  Laterality: N/A;  1030  . POSTERIOR LUMBAR FUSION  11/15/2014  . TONSILLECTOMY      Family Psychiatric History: depression and anxiety: mom   Family History:  Family History  Problem Relation Age of Onset  . Heart disease Mother   . Seizures Mother   . Migraines Mother   . Heart attack Father   . Colon cancer Neg Hx        Thinks his Aunt may have been diagnosed but no first degree relatives    Social History:   Social History   Socioeconomic History  . Marital status: Married    Spouse name: Not on file  . Number of children: 0  . Years of education: GED  . Highest education level: Not on file  Occupational History  . Occupation: Unemployed  Social Needs  . Financial resource strain: Not on file  . Food insecurity    Worry: Not on file    Inability: Not on file  . Transportation needs    Medical: Not on file    Non-medical: Not on file  Tobacco Use  . Smoking status:  Current Every Day Smoker    Packs/day: 0.50    Years: 24.00    Pack years: 12.00    Types: Cigarettes  . Smokeless tobacco: Never Used  . Tobacco comment: 11/15/2014 "down from 1 1/2 ppd in 08/2014"  Substance and Sexual Activity  . Alcohol use: No    Alcohol/week: 0.0 standard drinks    Frequency: Never  . Drug use: No  . Sexual activity: Yes    Birth control/protection: Other-see comments    Comment: Wife had tubaligation  Lifestyle  . Physical activity    Days per week: Not on file    Minutes per session: Not on file  . Stress: Not on file  Relationships  . Social Wellsite geologistconnections     Talks on phone: Not on file    Gets together: Not on file    Attends religious service: Not on file    Active member of club or organization: Not on file    Attends meetings of clubs or organizations: Not on file    Relationship status: Not on file  Other Topics Concern  . Not on file  Social History Narrative   Lives at home with his wife and step-children.   Right-handed.   3/4 - 1 two liter of soda per day.      Allergies:   Allergies  Allergen Reactions  . Flexeril [Cyclobenzaprine] Other (See Comments)    "seizures"    Metabolic Disorder Labs: No results found for: HGBA1C, MPG No results found for: PROLACTIN No results found for: CHOL, TRIG, HDL, CHOLHDL, VLDL, LDLCALC   Current Medications: Current Outpatient Medications  Medication Sig Dispense Refill  . Cholecalciferol (VITAMIN D PO) Take 25 Units by mouth daily.    Marland Kitchen. gabapentin (NEURONTIN) 100 MG capsule TAKE 1 CAPSULE BY MOUTH THREE TIMES A DAY 90 capsule 3  . hydrOXYzine (ATARAX/VISTARIL) 10 MG tablet Take one to two if needed 60 tablet 3  . ibuprofen (ADVIL,MOTRIN) 200 MG tablet Take 200 mg by mouth daily as needed.     No current facility-administered medications for this visit.       Psychiatric Specialty Exam: Review of Systems  Cardiovascular: Negative for chest pain and palpitations.  Skin: Negative for itching.  Psychiatric/Behavioral: Negative for depression and substance abuse.    There were no vitals taken for this visit.There is no height or weight on file to calculate BMI.  General Appearance:   Eye Contact:   Speech:  Slow  Volume:  Decreased  Mood: fair  Affect:  congruent  Thought Process:  Goal Directed  Orientation:  Full (Time, Place, and Person)  Thought Content:  Logical  Suicidal Thoughts:  No  Homicidal Thoughts:  No  Memory:  Immediate;   Fair Recent;   Fair  Judgement:  Fair  Insight:  Fair  Psychomotor Activity:  Concentration:  Concentration: Fair and Attention  Span: Fair  Recall:  [employer]  Fund of Knowledge:Fair  Language: Fair  Akathisia:    Handed:  Right  AIMS (if indicated):    Assets:  Desire for Improvement  ADL's:  Intact  Cognition: WNL  Sleep:  poor    Treatment Plan Summary: Medication management and Plan as follows  1. Major depression vs dysthymia: fair, continue gaba 2. GAD; less stressed gabapentin helping, 3. Panic disorder;sporadic,better on gabapentin and vistaril, continue 4. InsomniaL:not worse Call for refills as one sent yesterday I discussed the assessment and treatment plan with the patient. The patient was provided an opportunity to  ask questions and all were answered. The patient agreed with the plan and demonstrated an understanding of the instructions.   The patient was advised to call back or seek an in-person evaluation if the symptoms worsen or if the condition fails to improve as anticipated.  I provided 15 minutes of non-face-to-face time during this encounter. Fu 3-4 m. meds sent    Thresa Ross, MD 10/9/20208:34 AM

## 2019-06-14 ENCOUNTER — Ambulatory Visit (HOSPITAL_COMMUNITY): Payer: 59 | Admitting: Psychiatry

## 2019-06-16 ENCOUNTER — Other Ambulatory Visit (HOSPITAL_COMMUNITY): Payer: Self-pay | Admitting: Psychiatry

## 2019-07-10 ENCOUNTER — Ambulatory Visit (INDEPENDENT_AMBULATORY_CARE_PROVIDER_SITE_OTHER): Payer: 59 | Admitting: Psychiatry

## 2019-07-10 ENCOUNTER — Encounter (HOSPITAL_COMMUNITY): Payer: Self-pay | Admitting: Psychiatry

## 2019-07-10 ENCOUNTER — Ambulatory Visit (HOSPITAL_COMMUNITY): Payer: 59 | Admitting: Psychiatry

## 2019-07-10 DIAGNOSIS — F411 Generalized anxiety disorder: Secondary | ICD-10-CM

## 2019-07-10 DIAGNOSIS — F341 Dysthymic disorder: Secondary | ICD-10-CM | POA: Diagnosis not present

## 2019-07-10 DIAGNOSIS — F410 Panic disorder [episodic paroxysmal anxiety] without agoraphobia: Secondary | ICD-10-CM | POA: Diagnosis not present

## 2019-07-10 DIAGNOSIS — G4700 Insomnia, unspecified: Secondary | ICD-10-CM | POA: Diagnosis not present

## 2019-07-10 MED ORDER — GABAPENTIN 100 MG PO CAPS: ORAL_CAPSULE | ORAL | 3 refills | Status: DC

## 2019-07-10 NOTE — Progress Notes (Signed)
[facility] phone visit  Patient Identification: Eduardo Barnes MRN:  [id number] Date of Evaluation:  07/10/2019 Referral Source: primary care Chief Complaint:   depression follow up  Visit Diagnosis:    ICD-10-CM   1. GAD (generalized anxiety disorder)  F41.1   2. 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 Panic disorder  F41.0     History of Present Illness:  44 years old [name] referred initially for management of mood and anxiety On disability due to back condition.    I connected with Pollyann Glen on 07/10/19 at 10:45 AM EST by telephone and verified that I am speaking with the correct person using two identifiers.  I discussed the limitations, risks, security and privacy concerns of performing an evaluation and management service by telephone and the availability of in person appointments. I also discussed with the patient that there may be a patient responsible charge related to this service. The patient expressed understanding and agreed to proceed.  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 Parents were going thru divorce  Previous Psychotropic Medications: No   Substance Abuse History in the last 12 months:  No.  Consequences of Substance Abuse: NA  Past Medical History:  Past Medical History:  Diagnosis Date  . Arthritis    "probably a touch in my back" (11/15/2014)  . Chronic lower back pain   . DDD (degenerative disc disease), lumbar   . Diverticulitis Dec 2016  . Neck pain   . Seizure (HCC)    had 1 seizure after back surgery from "too many muscle relaxers;.On no meds and no more seizure    Past Surgical History:  Procedure Laterality Date  . ANTERIOR CERVICAL  DECOMP/DISCECTOMY FUSION N/A 08/30/2014   Procedure: ANTERIOR CERVICAL DECOMPRESSION/DISCECTOMY FUSION CERVICAL FIVE-SIX,CERVICAL SIX-SEVEN;  Surgeon: Tia Alert, MD;  Location: [facility];  Service: Neurosurgery;  Laterality: N/A;  . COLONOSCOPY WITH PROPOFOL N/A 05/13/2015   Procedure: COLONOSCOPY WITH PROPOFOL;  Surgeon: Corbin Ade, MD;  Location: [facility];  Service: Endoscopy;  Laterality: N/A;  1030  . POSTERIOR LUMBAR FUSION  11/15/2014  . TONSILLECTOMY      Family Psychiatric History: depression and anxiety: mom   Family History:  Family History  Problem Relation Age of Onset  . Heart disease Mother   . Seizures Mother   . Migraines Mother   . Heart attack Father   . Colon cancer Neg Hx        Thinks his Aunt may have been diagnosed but no first degree relatives    Social History:   Social History   Socioeconomic History  . Marital status: Married    Spouse name: Not on file  . Number of children: 0  . Years of education: GED  . Highest education level: Not on file  Occupational History  . Occupation: Unemployed  Tobacco Use  . Smoking status: Current Every Day Smoker    Packs/day: 0.50    Years: 24.00    Pack years: 12.00    Types: Cigarettes  . Smokeless tobacco: Never Used  . Tobacco comment: 11/15/2014 "down from 1 1/2 ppd in 08/2014"  Substance and Sexual Activity  . Alcohol use:  No    Alcohol/week: 0.0 standard drinks  . Drug use: No  . Sexual activity: Yes    Birth control/protection: Other-see comments    Comment: Wife had tubaligation  Other Topics Concern  . Not on file  Social History Narrative   Lives at home with his wife and step-children.   Right-handed.   3/4 - 1 two liter of soda per day.   Social Determinants of Health   Financial Resource Strain:   . Difficulty of Paying Living Expenses: Not on file  Food Insecurity:   . Worried About Charity fundraiser in the Last Year: Not on file  . Ran Out of Food in the Last Year:  Not on file  Transportation Needs:   . Lack of Transportation (Medical): Not on file  . Lack of Transportation (Non-Medical): Not on file  Physical Activity:   . Days of Exercise per Week: Not on file  . Minutes of Exercise per Session: Not on file  Stress:   . Feeling of Stress : Not on file  Social Connections:   . Frequency of Communication with Friends and Family: Not on file  . Frequency of Social Gatherings with Friends and Family: Not on file  . Attends Religious Services: Not on file  . Active Member of Clubs or Organizations: Not on file  . Attends Archivist Meetings: Not on file  . Marital Status: Not on file      Allergies:   Allergies  Allergen Reactions  . Flexeril [Cyclobenzaprine] Other (See Comments)    "seizures"    Metabolic Disorder Labs: No results found for: HGBA1C, MPG No results found for: PROLACTIN No results found for: CHOL, TRIG, HDL, CHOLHDL, VLDL, LDLCALC   Current Medications: Current Outpatient Medications  Medication Sig Dispense Refill  . Cholecalciferol (VITAMIN D PO) Take 25 Units by mouth daily.    Marland Kitchen gabapentin (NEURONTIN) 100 MG capsule TAKE 1 CAPSULE BY MOUTH THREE TIMES A DAY 90 capsule 3  . hydrOXYzine (ATARAX/VISTARIL) 10 MG tablet TAKE 1-2 TABLETS EVERY DAY BY MOUTH IF NEEDED 60 tablet 0  . ibuprofen (ADVIL,MOTRIN) 200 MG tablet Take 200 mg by mouth daily as needed.     No current facility-administered medications for this visit.      Psychiatric Specialty Exam: Review of Systems  Cardiovascular: Negative for chest pain and palpitations.  Skin: Negative for itching.  Psychiatric/Behavioral: Negative for depression and substance abuse.    There were no vitals taken for this visit.There is no height or weight on file to calculate BMI.  General Appearance:   Eye Contact:   Speech:  Slow  Volume:  Decreased  Mood: fair  Affect:  congruent  Thought Process:  Goal Directed  Orientation:  Full (Time, Place, and  Person)  Thought Content:  Logical  Suicidal Thoughts:  No  Homicidal Thoughts:  No  Memory:  Immediate;   Fair Recent;   Fair  Judgement:  Fair  Insight:  Fair  Psychomotor Activity:  Concentration:  Concentration: Fair and Attention Span: Fair  Recall:  [employer] of Knowledge:Fair  Language: Fair  Akathisia:    Handed:  Right  AIMS (if indicated):    Assets:  Desire for Improvement  ADL's:  Intact  Cognition: WNL  Sleep:  poor    Treatment Plan Summary: Medication management and Plan as follows  1. Major depression vs dysthymia: fair continue gaba  2. GAD; less stressed gabapentin helping, 3. Panic disorder sporadic, continue gaba  and vistaril 4. InsomniaL:not worse  I discussed the assessment and treatment plan with the patient. The patient was provided an opportunity to ask questions and all were answered. The patient agreed with the plan and demonstrated an understanding of the instructions.   The patient was advised to call back or seek an in-person evaluation if the symptoms worsen or if the condition fails to improve as anticipated.  I provided 15 minutes of non-face-to-face time during this encounter. Fu 3-4 m.     Thresa Ross, MD 3/8/202110:57 AM

## 2019-10-13 ENCOUNTER — Telehealth (HOSPITAL_COMMUNITY): Payer: 59 | Admitting: Psychiatry

## 2019-10-13 ENCOUNTER — Other Ambulatory Visit: Payer: Self-pay

## 2019-10-17 ENCOUNTER — Encounter (HOSPITAL_COMMUNITY): Payer: Self-pay | Admitting: Psychiatry

## 2019-10-17 ENCOUNTER — Telehealth (INDEPENDENT_AMBULATORY_CARE_PROVIDER_SITE_OTHER): Payer: 59 | Admitting: Psychiatry

## 2019-10-17 DIAGNOSIS — F341 Dysthymic disorder: Secondary | ICD-10-CM | POA: Diagnosis not present

## 2019-10-17 DIAGNOSIS — F411 Generalized anxiety disorder: Secondary | ICD-10-CM | POA: Diagnosis not present

## 2019-10-17 DIAGNOSIS — F410 Panic disorder [episodic paroxysmal anxiety] without agoraphobia: Secondary | ICD-10-CM | POA: Diagnosis not present

## 2019-10-17 NOTE — Progress Notes (Signed)
[facility] phone visit  Patient Identification: Eduardo Barnes MRN:  [id number] Date of Evaluation:  10/17/2019 Referral Source: primary care Chief Complaint:   depression follow up  Visit Diagnosis:    ICD-10-CM   1. GAD (generalized anxiety disorder)  F41.1   2. 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 Panic disorder  F41.0     History of Present Illness:  44 years old [name] referred initially for management of mood and anxiety On disability due to back condition.   I connected with Eduardo Barnes on 10/17/19 at  3:00 PM EDT by telephone and verified that I am speaking with the correct person using two identifiers. Patient location  Home Provider location : home  I discussed the limitations, risks, security and privacy concerns of performing an evaluation and management service by telephone and the availability of in person appointments. I also discussed with the patient that there may be a patient responsible charge related to this service. The patient expressed understanding and agreed to proceed.  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 Parents were going thru divorce  Previous Psychotropic Medications: No   Substance Abuse History in the last 12 months:  No.  Consequences of Substance Abuse: NA  Past Medical History:  Past Medical History:  Diagnosis Date  . Arthritis    "probably a touch in my back" (11/15/2014)  . Chronic lower back pain   . DDD (degenerative disc disease), lumbar   .  Diverticulitis Dec 2016  . Neck pain   . Seizure ([place])    had 1 seizure after back surgery from "too many muscle relaxers;.On no meds and no more seizure    Past Surgical History:  Procedure Laterality Date  . ANTERIOR CERVICAL DECOMP/DISCECTOMY FUSION N/A 08/30/2014   Procedure: ANTERIOR CERVICAL DECOMPRESSION/DISCECTOMY FUSION CERVICAL FIVE-SIX,CERVICAL SIX-SEVEN;  Surgeon: Eustace Moore, MD;  Location: [place] [facility];  Service: Neurosurgery;  Laterality: N/A;  . COLONOSCOPY WITH PROPOFOL N/A 05/13/2015   Procedure: COLONOSCOPY WITH PROPOFOL;  Surgeon: Daneil Dolin, MD;  Location: [facility];  Service: Endoscopy;  Laterality: N/A;  1030  . POSTERIOR LUMBAR FUSION  11/15/2014  . TONSILLECTOMY      Family Psychiatric History: depression and anxiety: mom   Family History:  Family History  Problem Relation Age of Onset  . Heart disease Mother   . Seizures Mother   . Migraines Mother   . Heart attack Father   . Colon cancer Neg Hx        Thinks his Aunt may have been diagnosed but no first degree relatives    Social History:   Social History   Socioeconomic History  . Marital status: Married    Spouse name: Not on file  . Number of children: 0  . Years of education: GED  . Highest education level: Not on file  Occupational History  . Occupation: Unemployed  Tobacco Use  . Smoking status: Current Every Day Smoker    Packs/day:  0.50    Years: 24.00    Pack years: 12.00    Types: Cigarettes  . Smokeless tobacco: Never Used  . Tobacco comment: 11/15/2014 "down from 1 1/2 ppd in 08/2014"  Vaping Use  . Vaping Use: Never used  Substance and Sexual Activity  . Alcohol use: No    Alcohol/week: 0.0 standard drinks  . Drug use: No  . Sexual activity: Yes    Birth control/protection: Other-see comments    Comment: Wife had tubaligation  Other Topics Concern  . Not on file  Social History Narrative   Lives at home with his wife and step-children.   Right-handed.   3/4 - 1  two liter of soda per day.   Social Determinants of Health   Financial Resource Strain:   . Difficulty of Paying Living Expenses:   Food Insecurity:   . Worried About Charity fundraiser in the Last Year:   . Arboriculturist in the Last Year:   Transportation Needs:   . Film/video editor (Medical):   Marland Kitchen Lack of Transportation (Non-Medical):   Physical Activity:   . Days of Exercise per Week:   . Minutes of Exercise per Session:   Stress:   . Feeling of Stress :   Social Connections:   . Frequency of Communication with Friends and Family:   . Frequency of Social Gatherings with Friends and Family:   . Attends Religious Services:   . Active Member of Clubs or Organizations:   . Attends Archivist Meetings:   Marland Kitchen Marital Status:       Allergies:   Allergies  Allergen Reactions  . Flexeril [Cyclobenzaprine] Other (See Comments)    "seizures"    Metabolic Disorder Labs: No results found for: HGBA1C, MPG No results found for: PROLACTIN No results found for: CHOL, TRIG, HDL, CHOLHDL, VLDL, LDLCALC   Current Medications: Current Outpatient Medications  Medication Sig Dispense Refill  . Cholecalciferol (VITAMIN D PO) Take 25 Units by mouth daily.    Marland Kitchen gabapentin (NEURONTIN) 100 MG capsule TAKE 1 CAPSULE BY MOUTH THREE TIMES A DAY 90 capsule 3  . hydrOXYzine (ATARAX/VISTARIL) 10 MG tablet TAKE 1-2 TABLETS EVERY DAY BY MOUTH IF NEEDED 60 tablet 0  . ibuprofen (ADVIL,MOTRIN) 200 MG tablet Take 200 mg by mouth daily as needed.     No current facility-administered medications for this visit.      Psychiatric Specialty Exam: Review of Systems  Cardiovascular: Negative for chest pain and palpitations.  Musculoskeletal: Positive for joint pain.  Skin: Negative for itching.  Psychiatric/Behavioral: Negative for substance abuse.    There were no vitals taken for this visit.There is no height or weight on file to calculate BMI.  General Appearance:   Eye  Contact:   Speech:  Slow  Volume:  Decreased  Mood: somewhat subdued  Affect:  congruent  Thought Process:  Goal Directed  Orientation:  Full (Time, Place, and Person)  Thought Content:  Logical  Suicidal Thoughts:  No  Homicidal Thoughts:  No  Memory:  Immediate;   Fair Recent;   Fair  Judgement:  Fair  Insight:  Fair  Psychomotor Activity:  Concentration:  Concentration: Fair and Attention Span: Fair  Recall:  [employer] of Knowledge:Fair  Language: Fair  Akathisia:    Handed:  Right  AIMS (if indicated):    Assets:  Desire for Improvement  ADL's:  Intact  Cognition: WNL  Sleep:  poor    Treatment  Plan Summary: Medication management and Plan as follows  1. Major depression vs dysthymia: somewhat subdued, pain effects mood. He wants to get Gene testing will ask staff to send Kit, possilbe add cymbalta later if needed. Continue gabapentin 2. GAD; worries gets high at times, continue vistaril, gaba for now. Send for gene testing  3. Panic disorder sporadic, continue gaba and vistaril 4. InsomniaL:manageble continue sleep hygiene  I discussed the assessment and treatment plan with the patient. The patient was provided an opportunity to ask questions and all were answered. The patient agreed with the plan and demonstrated an understanding of the instructions.   The patient was advised to call back or seek an in-person evaluation if the symptoms worsen or if the condition fails to improve as anticipated.  I provided 73mof non-face-to-face time during this encounter. Fu 3-4 w and will send gene testing and review     NMerian Capron MD 6/15/20213:19 PM

## 2019-11-16 ENCOUNTER — Telehealth (INDEPENDENT_AMBULATORY_CARE_PROVIDER_SITE_OTHER): Payer: 59 | Admitting: Psychiatry

## 2019-11-16 ENCOUNTER — Encounter (HOSPITAL_COMMUNITY): Payer: Self-pay | Admitting: Psychiatry

## 2019-11-16 DIAGNOSIS — F411 Generalized anxiety disorder: Secondary | ICD-10-CM

## 2019-11-16 DIAGNOSIS — F410 Panic disorder [episodic paroxysmal anxiety] without agoraphobia: Secondary | ICD-10-CM | POA: Diagnosis not present

## 2019-11-16 DIAGNOSIS — F341 Dysthymic disorder: Secondary | ICD-10-CM

## 2019-11-16 MED ORDER — DESVENLAFAXINE SUCCINATE ER 50 MG PO TB24: 50.0000 mg | ORAL_TABLET | Freq: Every day | ORAL | 0 refills | Status: DC

## 2019-11-16 NOTE — Progress Notes (Signed)
[facility] phone visit  Patient Identification: Eduardo Barnes MRN:  [id number] Date of Evaluation:  11/16/2019 Referral Source: primary care Chief Complaint:   depression follow up  Visit Diagnosis:    ICD-10-CM   1. GAD (generalized anxiety disorder)  F41.1   2. 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 Panic disorder  F41.0     History of Present Illness:  44 years old [name] referred initially for management of mood and anxiety On disability due to back condition.    I connected with Pollyann Glen on 11/16/19 at  1;15 PM EDT by telephone and verified that I am speaking with the correct person using two identifiers. Patient location  Home Provider location : home  I discussed the limitations, risks, security and privacy concerns of performing an evaluation and management service by telephone and the availability of in person appointments. I also discussed with the patient that there may be a patient responsible charge related to this service. The patient expressed understanding and agreed to proceed.  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 Parents were going thru divorce  Previous Psychotropic Medications: No   Substance Abuse History in the last 12 months:  No.  Consequences of Substance Abuse: NA  Past Medical History:  Past Medical  History:  Diagnosis Date  . Arthritis    "probably a touch in my back" (11/15/2014)  . Chronic lower back pain   . DDD (degenerative disc disease), lumbar   . Diverticulitis Dec 2016  . Neck pain   . Seizure (HCC)    had 1 seizure after back surgery from "too many muscle relaxers;.On no meds and no more seizure    Past Surgical History:  Procedure Laterality Date  . ANTERIOR CERVICAL DECOMP/DISCECTOMY FUSION N/A 08/30/2014   Procedure: ANTERIOR CERVICAL DECOMPRESSION/DISCECTOMY FUSION CERVICAL FIVE-SIX,CERVICAL SIX-SEVEN;  Surgeon: Tia Alert, MD;  Location: [facility];  Service: Neurosurgery;  Laterality: N/A;  . COLONOSCOPY WITH PROPOFOL N/A 05/13/2015   Procedure: COLONOSCOPY WITH PROPOFOL;  Surgeon: Corbin Ade, MD;  Location: [facility];  Service: Endoscopy;  Laterality: N/A;  1030  . POSTERIOR LUMBAR FUSION  11/15/2014  . TONSILLECTOMY      Family Psychiatric History: depression and anxiety: mom   Family History:  Family History  Problem Relation Age of Onset  . Heart disease Mother   . Seizures Mother   . Migraines Mother   . Heart attack Father   . Colon cancer Neg Hx        Thinks his Aunt may have been diagnosed but no first degree relatives    Social History:   Social History   Socioeconomic History  . Marital status: Married    Spouse name: Not on file  . Number of children: 0  . Years of education: GED  . Highest education level: Not  on file  Occupational History  . Occupation: Unemployed  Tobacco Use  . Smoking status: Current Every Day Smoker    Packs/day: 0.50    Years: 24.00    Pack years: 12.00    Types: Cigarettes  . Smokeless tobacco: Never Used  . Tobacco comment: 11/15/2014 "down from 1 1/2 ppd in 08/2014"  Vaping Use  . Vaping Use: Never used  Substance and Sexual Activity  . Alcohol use: No    Alcohol/week: 0.0 standard drinks  . Drug use: No  . Sexual activity: Yes    Birth control/protection: Other-see comments    Comment:  Wife had tubaligation  Other Topics Concern  . Not on file  Social History Narrative   Lives at home with his wife and step-children.   Right-handed.   3/4 - 1 two liter of soda per day.   Social Determinants of Health   Financial Resource Strain:   . Difficulty of Paying Living Expenses:   Food Insecurity:   . Worried About Programme researcher, broadcasting/film/video in the Last Year:   . Barista in the Last Year:   Transportation Needs:   . Freight forwarder (Medical):   Marland Kitchen Lack of Transportation (Non-Medical):   Physical Activity:   . Days of Exercise per Week:   . Minutes of Exercise per Session:   Stress:   . Feeling of Stress :   Social Connections:   . Frequency of Communication with Friends and Family:   . Frequency of Social Gatherings with Friends and Family:   . Attends Religious Services:   . Active Member of Clubs or Organizations:   . Attends Banker Meetings:   Marland Kitchen Marital Status:       Allergies:   Allergies  Allergen Reactions  . Flexeril [Cyclobenzaprine] Other (See Comments)    "seizures"    Metabolic Disorder Labs: No results found for: HGBA1C, MPG No results found for: PROLACTIN No results found for: CHOL, TRIG, HDL, CHOLHDL, VLDL, LDLCALC   Current Medications: Current Outpatient Medications  Medication Sig Dispense Refill  . Cholecalciferol (VITAMIN D PO) Take 25 Units by mouth daily.    Marland Kitchen desvenlafaxine (PRISTIQ) 50 MG 24 hr tablet Take 1 tablet (50 mg total) by mouth daily. 30 tablet 0  . gabapentin (NEURONTIN) 100 MG capsule TAKE 1 CAPSULE BY MOUTH THREE TIMES A DAY 90 capsule 3  . hydrOXYzine (ATARAX/VISTARIL) 10 MG tablet TAKE 1-2 TABLETS EVERY DAY BY MOUTH IF NEEDED 60 tablet 0  . ibuprofen (ADVIL,MOTRIN) 200 MG tablet Take 200 mg by mouth daily as needed.     No current facility-administered medications for this visit.      Psychiatric Specialty Exam: Review of Systems  Cardiovascular: Negative for chest pain and  palpitations.  Musculoskeletal: Positive for joint pain.  Skin: Negative for itching.  Psychiatric/Behavioral: Negative for substance abuse.    There were no vitals taken for this visit.There is no height or weight on file to calculate BMI.  General Appearance:   Eye Contact:   Speech:  Slow  Volume:  Decreased  Mood: somewhat subdued  Affect:  congruent  Thought Process:  Goal Directed  Orientation:  Full (Time, Place, and Person)  Thought Content:  Logical  Suicidal Thoughts:  No  Homicidal Thoughts:  No  Memory:  Immediate;   Fair Recent;   Fair  Judgement:  Fair  Insight:  Fair  Psychomotor Activity:  Concentration:  Concentration: Fair and Attention Span: Fair  Recall:  [employer] of Knowledge:Fair  Language: Fair  Akathisia:    Handed:  Right  AIMS (if indicated):    Assets:  Desire for Improvement  ADL's:  Intact  Cognition: WNL  Sleep:  poor    Treatment Plan Summary: Medication management and Plan as follows  1. Major depression vs dysthymia:subdued, start pristiq, discussed side effects Consider therapy to deal with relationship 2. GAD; relationship worries, start pristiq , consider therapy 3. Panic disorder sporadic, start pristiq 4. InsomniaL:manageble continue sleep hygiene  I discussed the assessment and treatment plan with the patient. The patient was provided an opportunity to ask questions and all were answered. The patient agreed with the plan and demonstrated an understanding of the instructions.   The patient was advised to call back or seek an in-person evaluation if the symptoms worsen or if the condition fails to improve as anticipated.  I provided 15- 20 min of non-face-to-face time during this encounter. Fu 3-4 w or earlier if needed  Thresa Ross, MD 7/15/20211:21 PM

## 2019-11-20 ENCOUNTER — Telehealth (HOSPITAL_COMMUNITY): Payer: Self-pay | Admitting: Psychiatry

## 2019-11-20 MED ORDER — BUPROPION HCL ER (SR) 100 MG PO TB12: 100.0000 mg | ORAL_TABLET | Freq: Every day | ORAL | 0 refills | Status: DC

## 2019-11-20 NOTE — Telephone Encounter (Signed)
Pt states he can not afford the pristiq. It is over $100 with the insurance.  Is there something else you can write.   Please advise.   CB# [phone]

## 2019-11-20 NOTE — Telephone Encounter (Signed)
Patient informed. 

## 2019-11-20 NOTE — Telephone Encounter (Signed)
Ok I am sending wellbutrin which was also discussed DC pristiq

## 2019-12-12 ENCOUNTER — Other Ambulatory Visit (HOSPITAL_COMMUNITY): Payer: Self-pay | Admitting: Psychiatry

## 2019-12-19 ENCOUNTER — Telehealth (INDEPENDENT_AMBULATORY_CARE_PROVIDER_SITE_OTHER): Payer: 59 | Admitting: Psychiatry

## 2019-12-19 ENCOUNTER — Encounter (HOSPITAL_COMMUNITY): Payer: Self-pay | Admitting: Psychiatry

## 2019-12-19 DIAGNOSIS — F411 Generalized anxiety disorder: Secondary | ICD-10-CM | POA: Diagnosis not present

## 2019-12-19 DIAGNOSIS — F341 Dysthymic disorder: Secondary | ICD-10-CM | POA: Diagnosis not present

## 2019-12-19 MED ORDER — BUPROPION HCL ER (SR) 100 MG PO TB12: 100.0000 mg | ORAL_TABLET | Freq: Every day | ORAL | 1 refills | Status: DC

## 2019-12-19 NOTE — Progress Notes (Signed)
[facility] phone visit  Patient Identification: Eduardo Barnes MRN:  [id number] Date of Evaluation:  12/19/2019 Referral Source: primary care Chief Complaint:   depression follow up  Visit Diagnosis:    ICD-10-CM   1. GAD (generalized anxiety disorder)  F41.1   2. 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    I connected with Pollyann Glen on 07/10/19 at 10:45 AM EST by telephone and verified that I am speaking with the correct person using two identifiers.  I discussed the limitations, risks, security and privacy concerns of performing an evaluation and management service by telephone and the availability of in person appointments. I also discussed with the patient that there may be a patient responsible charge related to this service. The patient expressed understanding and agreed to proceed. Patient location home Provider location home office   Gene testing was reviewed last visit was anxious . 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 Parents were going thru divorce  Previous Psychotropic Medications: No   Substance Abuse History in the last 12 months:  No.  Consequences of Substance Abuse: NA  Past Medical History:  Past Medical History:  Diagnosis Date  . Arthritis    "probably a touch in my back" (11/15/2014)  . Chronic lower back pain   . DDD (degenerative disc disease), lumbar   . Diverticulitis Dec 2016  . Neck pain   .  Seizure (HCC)    had 1 seizure after back surgery from "too many muscle relaxers;.On no meds and no more seizure    Past Surgical History:  Procedure Laterality Date  . ANTERIOR CERVICAL DECOMP/DISCECTOMY FUSION N/A 08/30/2014   Procedure: ANTERIOR CERVICAL DECOMPRESSION/DISCECTOMY FUSION CERVICAL FIVE-SIX,CERVICAL SIX-SEVEN;  Surgeon: Tia Alert, MD;  Location: [facility];  Service: Neurosurgery;  Laterality: N/A;  . COLONOSCOPY WITH PROPOFOL N/A 05/13/2015   Procedure: COLONOSCOPY WITH PROPOFOL;  Surgeon: Corbin Ade, MD;  Location: [facility];  Service: Endoscopy;  Laterality: N/A;  1030  . POSTERIOR LUMBAR FUSION  11/15/2014  . TONSILLECTOMY      Family Psychiatric History: depression and anxiety: mom   Family History:  Family History  Problem Relation Age of Onset  . Heart disease Mother   . Seizures Mother   . Migraines Mother   . Heart attack Father   . Colon cancer Neg Hx        Thinks his Aunt may have been diagnosed but no first degree relatives    Social History:   Social History   Socioeconomic History  . Marital status: Married    Spouse name: Not on file  . Number of children: 0  . Years of education: GED  . Highest education level: Not on file  Occupational History  . Occupation: Unemployed  Tobacco Use  . Smoking status: Current Every Day Smoker    Packs/day: 0.50    Years: 24.00    Pack years: 12.00    Types: Cigarettes  .  Smokeless tobacco: Never Used  . Tobacco comment: 11/15/2014 "down from 1 1/2 ppd in 08/2014"  Vaping Use  . Vaping Use: Never used  Substance and Sexual Activity  . Alcohol use: No    Alcohol/week: 0.0 standard drinks  . Drug use: No  . Sexual activity: Yes    Birth control/protection: Other-see comments    Comment: Wife had tubaligation  Other Topics Concern  . Not on file  Social History Narrative   Lives at home with his wife and step-children.   Right-handed.   3/4 - 1 two liter of soda per day.   Social  Determinants of Health   Financial Resource Strain:   . Difficulty of Paying Living Expenses:   Food Insecurity:   . Worried About Programme researcher, broadcasting/film/video in the Last Year:   . Barista in the Last Year:   Transportation Needs:   . Freight forwarder (Medical):   Marland Kitchen Lack of Transportation (Non-Medical):   Physical Activity:   . Days of Exercise per Week:   . Minutes of Exercise per Session:   Stress:   . Feeling of Stress :   Social Connections:   . Frequency of Communication with Friends and Family:   . Frequency of Social Gatherings with Friends and Family:   . Attends Religious Services:   . Active Member of Clubs or Organizations:   . Attends Banker Meetings:   Marland Kitchen Marital Status:       Allergies:   Allergies  Allergen Reactions  . Flexeril [Cyclobenzaprine] Other (See Comments)    "seizures"    Metabolic Disorder Labs: No results found for: HGBA1C, MPG No results found for: PROLACTIN No results found for: CHOL, TRIG, HDL, CHOLHDL, VLDL, LDLCALC   Current Medications: Current Outpatient Medications  Medication Sig Dispense Refill  . buPROPion (WELLBUTRIN SR) 100 MG 12 hr tablet Take 1 tablet (100 mg total) by mouth daily. Added more refills till November 2021 30 tablet 1  . Cholecalciferol (VITAMIN D PO) Take 25 Units by mouth daily.    . hydrOXYzine (ATARAX/VISTARIL) 10 MG tablet TAKE 1-2 TABLETS EVERY DAY BY MOUTH IF NEEDED 60 tablet 0  . ibuprofen (ADVIL,MOTRIN) 200 MG tablet Take 200 mg by mouth daily as needed.     No current facility-administered medications for this visit.      Psychiatric Specialty Exam: Review of Systems  Cardiovascular: Negative for chest pain and palpitations.  Skin: Negative for itching.  Psychiatric/Behavioral: Negative for depression and substance abuse.    There were no vitals taken for this visit.There is no height or weight on file to calculate BMI.  General Appearance:   Eye Contact:   Speech:  Slow   Volume:  Decreased  Mood: better  Affect:  congruent  Thought Process:  Goal Directed  Orientation:  Full (Time, Place, and Person)  Thought Content:  Logical  Suicidal Thoughts:  No  Homicidal Thoughts:  No  Memory:  Immediate;   Fair Recent;   Fair  Judgement:  Fair  Insight:  Fair  Psychomotor Activity:  Concentration:  Concentration: Fair and Attention Span: Fair  Recall:  [employer] of Knowledge:Fair  Language: Fair  Akathisia:    Handed:  Right  AIMS (if indicated):    Assets:  Desire for Improvement  ADL's:  Intact  Cognition: WNL  Sleep:  poor    Treatment Plan Summary: Medication management and Plan as follows  1. Major depression vs dysthymia: improved,  continue wellbutrin, not taking gabapentin now  2. GAD; manageable with therapy . Not taking gaba 3. Panic disorder sporadic, work in therapy 4. InsomniaL:not worse  I discussed the assessment and treatment plan with the patient. The patient was provided an opportunity to ask questions and all were answered. The patient agreed with the plan and demonstrated an understanding of the instructions.   The patient was advised to call back or seek an in-person evaluation if the symptoms worsen or if the condition fails to improve as anticipated.  I provided 15 minutes of non-face-to-face time during this encounter. Fu 3-m.     Thresa Ross, MD 8/17/202111:04 AM

## 2020-01-19 ENCOUNTER — Other Ambulatory Visit (HOSPITAL_COMMUNITY): Payer: Self-pay

## 2020-01-19 MED ORDER — BUPROPION HCL ER (SR) 100 MG PO TB12: 100.0000 mg | ORAL_TABLET | Freq: Every day | ORAL | 0 refills | Status: DC

## 2020-03-22 ENCOUNTER — Telehealth (INDEPENDENT_AMBULATORY_CARE_PROVIDER_SITE_OTHER): Payer: 59 | Admitting: Psychiatry

## 2020-03-22 ENCOUNTER — Encounter (HOSPITAL_COMMUNITY): Payer: Self-pay | Admitting: Psychiatry

## 2020-03-22 DIAGNOSIS — F411 Generalized anxiety disorder: Secondary | ICD-10-CM

## 2020-03-22 DIAGNOSIS — F341 Dysthymic disorder: Secondary | ICD-10-CM

## 2020-03-22 MED ORDER — BUPROPION HCL ER (SR) 100 MG PO TB12
100.0000 mg | ORAL_TABLET | Freq: Every day | ORAL | 0 refills | Status: DC
Start: 2020-03-22 — End: 2020-07-03

## 2020-03-22 NOTE — Progress Notes (Signed)
[facility] phone visit  Patient Identification: Eduardo Barnes MRN:  [id number] Date of Evaluation:  03/22/2020 Referral Source: primary care Chief Complaint:   depression follow up  Visit Diagnosis:    ICD-10-CM   1. GAD (generalized anxiety disorder)  F41.1   2. 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     I connected with Eduardo Barnes on 03/22/20 at 11:30 AM EST by telephone and verified that I am speaking with the correct person using two identifiers.  I discussed the limitations, risks, security and privacy concerns of performing an evaluation and management service by telephone and the availability of in person appointments. I also discussed with the patient that there may be a patient responsible charge related to this service. The patient expressed understanding and agreed to proceed. 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 Parents were going thru divorce  Previous Psychotropic Medications: No   Substance Abuse History in the last 12 months:  No.  Consequences of Substance Abuse: NA  Past Medical History:  Past Medical History:  Diagnosis Date  . Arthritis    "probably a touch in my back" (11/15/2014)  . Chronic lower back pain   . DDD (degenerative disc disease), lumbar   . Diverticulitis Dec 2016  . Neck pain   . Seizure (HCC)    had 1 seizure after back surgery from "too many muscle relaxers;.On no meds and no more seizure    Past Surgical History:  Procedure  Laterality Date  . ANTERIOR CERVICAL DECOMP/DISCECTOMY FUSION N/A 08/30/2014   Procedure: ANTERIOR CERVICAL DECOMPRESSION/DISCECTOMY FUSION CERVICAL FIVE-SIX,CERVICAL SIX-SEVEN;  Surgeon: Tia Alert, MD;  Location: [facility];  Service: Neurosurgery;  Laterality: N/A;  . COLONOSCOPY WITH PROPOFOL N/A 05/13/2015   Procedure: COLONOSCOPY WITH PROPOFOL;  Surgeon: Corbin Ade, MD;  Location: [facility];  Service: Endoscopy;  Laterality: N/A;  1030  . POSTERIOR LUMBAR FUSION  11/15/2014  . TONSILLECTOMY      Family Psychiatric History: depression and anxiety: mom   Family History:  Family History  Problem Relation Age of Onset  . Heart disease Mother   . Seizures Mother   . Migraines Mother   . Heart attack Father   . Colon cancer Neg Hx        Thinks his Aunt may have been diagnosed but no first degree relatives    Social History:   Social History   Socioeconomic History  . Marital status: Married    Spouse name: Not on file  . Number of children: 0  . Years of education: GED  . Highest education level: Not on file  Occupational History  . Occupation: Unemployed  Tobacco Use  . Smoking status: Current Every Day Smoker    Packs/day: 0.50    Years: 24.00    Pack years: 12.00    Types: Cigarettes  . Smokeless tobacco: Never Used  . Tobacco comment: 11/15/2014 "down from 1 1/2 ppd in 08/2014"  Vaping Use  . Vaping Use: Never used  Substance and Sexual Activity  . Alcohol use: No    Alcohol/week: 0.0 standard drinks  . Drug use: No  . Sexual activity: Yes    Birth control/protection: Other-see comments    Comment: Wife had tubaligation  Other Topics Concern  . Not on file  Social History Narrative   Lives at home with his wife and step-children.   Right-handed.   3/4 - 1 two liter of soda per day.   Social Determinants of Health   Financial Resource Strain:   . Difficulty of Paying Living Expenses: Not on file  Food Insecurity:   . Worried About Patent examiner in the Last Year: Not on file  . Ran Out of Food in the Last Year: Not on file  Transportation Needs:   . Lack of Transportation (Medical): Not on file  . Lack of Transportation (Non-Medical): Not on file  Physical Activity:   . Days of Exercise per Week: Not on file  . Minutes of Exercise per Session: Not on file  Stress:   . Feeling of Stress : Not on file  Social Connections:   . Frequency of Communication with Friends and Family: Not on file  . Frequency of Social Gatherings with Friends and Family: Not on file  . Attends Religious Services: Not on file  . Active Member of Clubs or Organizations: Not on file  . Attends Banker Meetings: Not on file  . Marital Status: Not on file      Allergies:   Allergies  Allergen Reactions  . Flexeril [Cyclobenzaprine] Other (See Comments)    "seizures"    Metabolic Disorder Labs: No results found for: HGBA1C, MPG No results found for: PROLACTIN No results found for: CHOL, TRIG, HDL, CHOLHDL, VLDL, LDLCALC   Current Medications: Current Outpatient Medications  Medication Sig Dispense Refill  . buPROPion (WELLBUTRIN SR) 100 MG 12 hr tablet Take 1 tablet (100 mg total) by mouth daily. Added more refills till November 2021 90 tablet 0  . Cholecalciferol (VITAMIN D PO) Take 25 Units by mouth daily.    . hydrOXYzine (ATARAX/VISTARIL) 10 MG tablet TAKE 1-2 TABLETS EVERY DAY BY MOUTH IF NEEDED 60 tablet 0  . ibuprofen (ADVIL,MOTRIN) 200 MG tablet Take 200 mg by mouth daily as needed.     No current facility-administered medications for this visit.      Psychiatric Specialty Exam: Review of Systems  Cardiovascular: Negative for chest pain and palpitations.  Skin: Negative for itching.  Psychiatric/Behavioral: Negative for depression and substance abuse.    There were no vitals taken for this visit.There is no height or weight on file to calculate BMI.  General Appearance:   Eye Contact:   Speech:  Slow   Volume:  Decreased  Mood: better  Affect:  congruent  Thought Process:  Goal Directed  Orientation:  Full (Time, Place, and Person)  Thought Content:  Logical  Suicidal Thoughts:  No  Homicidal Thoughts:  No  Memory:  Immediate;   Fair Recent;   Fair  Judgement:  Fair  Insight:  Fair  Psychomotor Activity:  Concentration:  Concentration: Fair and Attention Span: Fair  Recall:  [employer] of Knowledge:Fair  Language: Fair  Akathisia:    Handed:  Right  AIMS (if indicated):    Assets:  Desire for Improvement  ADL's:  Intact  Cognition: WNL  Sleep:  poor    Treatment Plan Summary: Medication management and Plan as follows  1. Major depression vs  dysthymia: better, continue wellbutrin. Not on gaba now  2. GAD; manageable . Not taking gaba 3. Panic disorder sporadic, work in therapy and coping skills 4. InsomniaL:not worse  I discussed the assessment and treatment plan with the patient. The patient was provided an opportunity to ask questions and all were answered. The patient agreed with the plan and demonstrated an understanding of the instructions.   The patient was advised to call back or seek an in-person evaluation if the symptoms worsen or if the condition fails to improve as anticipated.  I provided 15 minutes of non-face-to-face time during this encounter. Fu 3-m.     Thresa Ross, MD 11/19/202111:43 AM

## 2020-07-03 ENCOUNTER — Encounter (HOSPITAL_COMMUNITY): Payer: Self-pay | Admitting: Psychiatry

## 2020-07-03 ENCOUNTER — Telehealth (INDEPENDENT_AMBULATORY_CARE_PROVIDER_SITE_OTHER): Payer: 59 | Admitting: Psychiatry

## 2020-07-03 DIAGNOSIS — F411 Generalized anxiety disorder: Secondary | ICD-10-CM

## 2020-07-03 DIAGNOSIS — F341 Dysthymic disorder: Secondary | ICD-10-CM | POA: Diagnosis not present

## 2020-07-03 MED ORDER — BUPROPION HCL ER (SR) 150 MG PO TB12
150.0000 mg | ORAL_TABLET | Freq: Every day | ORAL | 0 refills | Status: DC
Start: 2020-07-03 — End: 2020-07-30

## 2020-07-03 NOTE — Progress Notes (Signed)
[facility] phone visit  Patient Identification: Eduardo Barnes MRN:  [id number] Date of Evaluation:  07/03/2020 Referral Source: primary care Chief Complaint:   depression follow up  Visit Diagnosis:    ICD-10-CM   1. 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 GAD (generalized anxiety disorder)  F41.1    Virtual Visit via Telephone Note  I connected with Eduardo Barnes on 07/03/20 at 10:30 AM EST by telephone and verified that I am speaking with the correct person using two identifiers.  Location: Patient: home Provider: home office   I discussed the limitations, risks, security and privacy concerns of performing an evaluation and management service by telephone and the availability of in person appointments. I also discussed with the patient that there may be a patient responsible charge related to this service. The patient expressed understanding and agreed to proceed.     I discussed the assessment and treatment plan with the patient. The patient was provided an opportunity to ask questions and all were answered. The patient agreed with the plan and demonstrated an understanding of the instructions.   The patient was advised to call back or seek an in-person evaluation if the symptoms worsen or if the condition fails to improve as anticipated.  I provided 12  minutes of non-face-to-face time during this encounter.   Thresa Ross, MD  History of Present Illness:  45 years old [name] referred initially for management of mood and anxiety On disability due to back condition. Feeling subdued, stress being disability not approved, and back condition. 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 Parents were going thru divorce  Previous Psychotropic Medications: No   Substance Abuse History in the last 12 months:  No.  Consequences of Substance Abuse: NA  Past Medical History:  Past Medical History:  Diagnosis Date  . Arthritis    "probably a touch in my back" (11/15/2014)  . Chronic lower back pain   . DDD (degenerative disc disease), lumbar   . Diverticulitis Dec 2016  . Neck pain   . Seizure (HCC)    had 1 seizure after back surgery from "too many muscle relaxers;.On no meds and no more seizure    Past Surgical History:  Procedure Laterality Date  . ANTERIOR CERVICAL DECOMP/DISCECTOMY FUSION N/A 08/30/2014   Procedure: ANTERIOR CERVICAL DECOMPRESSION/DISCECTOMY FUSION CERVICAL FIVE-SIX,CERVICAL SIX-SEVEN;  Surgeon: Tia Alert, MD;  Location: [facility];  Service: Neurosurgery;  Laterality: N/A;  . COLONOSCOPY WITH PROPOFOL N/A 05/13/2015   Procedure: COLONOSCOPY WITH PROPOFOL;  Surgeon: Corbin Ade, MD;  Location: [facility];  Service: Endoscopy;  Laterality: N/A;  1030  . POSTERIOR LUMBAR FUSION  11/15/2014  . TONSILLECTOMY      Family Psychiatric History: depression and anxiety: mom   Family History:  Family History  Problem Relation Age of Onset  . Heart disease Mother   . Seizures Mother   . Migraines Mother   . Heart attack Father   . Colon cancer Neg Hx        Thinks his Aunt may have been diagnosed but no first degree relatives    Social History:   Social History   Socioeconomic History  .  Marital status: Married    Spouse name: Not on file  . Number of children: 0  . Years of education: GED  . Highest education level: Not on file  Occupational History  . Occupation: Unemployed  Tobacco Use  . Smoking status: Current Every Day Smoker    Packs/day: 0.50    Years: 24.00    Pack years: 12.00    Types: Cigarettes  . Smokeless tobacco: Never Used  . Tobacco comment: 11/15/2014 "down from 1 1/2 ppd in 08/2014"  Vaping  Use  . Vaping Use: Never used  Substance and Sexual Activity  . Alcohol use: No    Alcohol/week: 0.0 standard drinks  . Drug use: No  . Sexual activity: Yes    Birth control/protection: Other-see comments    Comment: Wife had tubaligation  Other Topics Concern  . Not on file  Social History Narrative   Lives at home with his wife and step-children.   Right-handed.   3/4 - 1 two liter of soda per day.   Social Determinants of Health   Financial Resource Strain: Not on file  Food Insecurity: Not on file  Transportation Needs: Not on file  Physical Activity: Not on file  Stress: Not on file  Social Connections: Not on file      Allergies:   Allergies  Allergen Reactions  . Flexeril [Cyclobenzaprine] Other (See Comments)    "seizures"    Metabolic Disorder Labs: No results found for: HGBA1C, MPG No results found for: PROLACTIN No results found for: CHOL, TRIG, HDL, CHOLHDL, VLDL, LDLCALC   Current Medications: Current Outpatient Medications  Medication Sig Dispense Refill  . buPROPion (WELLBUTRIN SR) 150 MG 12 hr tablet Take 1 tablet (150 mg total) by mouth daily. 30 tablet 0  . Cholecalciferol (VITAMIN D PO) Take 25 Units by mouth daily.    . hydrOXYzine (ATARAX/VISTARIL) 10 MG tablet TAKE 1-2 TABLETS EVERY DAY BY MOUTH IF NEEDED 60 tablet 0  . ibuprofen (ADVIL,MOTRIN) 200 MG tablet Take 200 mg by mouth daily as needed.     No current facility-administered medications for this visit.      Psychiatric Specialty Exam: Review of Systems  Cardiovascular: Negative for chest pain.  Musculoskeletal: Positive for back pain.  Psychiatric/Behavioral: Positive for depression. Negative for substance abuse.    There were no vitals taken for this visit.There is no height or weight on file to calculate BMI.  General Appearance:   Eye Contact:   Speech:  Slow  Volume:  Decreased  Mood:subdued  Affect:  congruent  Thought Process:  Goal Directed  Orientation:  Full  (Time, Place, and Person)  Thought Content:  Logical  Suicidal Thoughts:  No  Homicidal Thoughts:  No  Memory:  Immediate;   Fair Recent;   Fair  Judgement:  Fair  Insight:  Fair  Psychomotor Activity:  Concentration:  Concentration: Fair and Attention Span: Fair  Recall:  [employer] of Knowledge:Fair  Language: Fair  Akathisia:    Handed:  Right  AIMS (if indicated):    Assets:  Desire for Improvement  ADL's:  Intact  Cognition: WNL  Sleep:  poor    Treatment Plan Summary: Medication management and Plan as follows  1. Major depression vs dysthymia: subdued increase wellbutrin 150 mg   2. GAD; fluctuates, on gaba as well for pain, anxiety by pcp 3. Panic disorder : sporadic, gaba helps, discussed distractions for negative thoughts 4. InsomniaL:not worse  Fu 72m.  Thresa Ross, MD 3/2/202210:43 AM

## 2020-07-28 ENCOUNTER — Other Ambulatory Visit (HOSPITAL_COMMUNITY): Payer: Self-pay | Admitting: Psychiatry

## 2020-09-10 ENCOUNTER — Other Ambulatory Visit: Payer: Self-pay

## 2020-09-10 ENCOUNTER — Ambulatory Visit (INDEPENDENT_AMBULATORY_CARE_PROVIDER_SITE_OTHER): Payer: 59 | Admitting: Psychiatry

## 2020-09-10 ENCOUNTER — Encounter (HOSPITAL_COMMUNITY): Payer: Self-pay | Admitting: Psychiatry

## 2020-09-10 DIAGNOSIS — F411 Generalized anxiety disorder: Secondary | ICD-10-CM

## 2020-09-10 DIAGNOSIS — F341 Dysthymic disorder: Secondary | ICD-10-CM | POA: Diagnosis not present

## 2020-09-10 MED ORDER — BUPROPION HCL ER (SR) 150 MG PO TB12: 150.0000 mg | ORAL_TABLET | Freq: Every day | ORAL | 1 refills | Status: DC

## 2020-09-10 NOTE — Progress Notes (Signed)
[facility] phone visit  Patient Identification: Eduardo Barnes MRN:  [id number] Date of Evaluation:  09/10/2020 Referral Source: primary care Chief Complaint:   depression follow up  Visit Diagnosis:    ICD-10-CM   1. 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 GAD (generalized anxiety disorder)  F41.1      Face to face time with documentation  History of Present Illness:  45 years old [name] referred initially for management of mood and anxiety On disability due to back condition.   Stress related to GI issues ruling out C. 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 Parents were going thru divorce  Previous Psychotropic Medications: No   Substance Abuse History in the last 12 months:  No.  Consequences of Substance Abuse: NA  Past Medical History:  Past Medical History:  Diagnosis Date  . Arthritis    "probably a touch in my back" (11/15/2014)  . Chronic lower back pain   . DDD (degenerative disc disease), lumbar   . Diverticulitis Dec 2016  . Neck pain   . Seizure (HCC)    had 1 seizure after back surgery from "too many muscle relaxers;.On no meds and no more seizure    Past Surgical History:  Procedure Laterality Date  . ANTERIOR CERVICAL DECOMP/DISCECTOMY FUSION N/A 08/30/2014   Procedure: ANTERIOR CERVICAL DECOMPRESSION/DISCECTOMY FUSION CERVICAL FIVE-SIX,CERVICAL SIX-SEVEN;  Surgeon: Tia Alert, MD;  Location: [facility];  Service: Neurosurgery;  Laterality: N/A;  . COLONOSCOPY WITH PROPOFOL N/A 05/13/2015   Procedure: COLONOSCOPY WITH PROPOFOL;  Surgeon: Corbin Ade, MD;  Location: [facility];  Service: Endoscopy;   Laterality: N/A;  1030  . POSTERIOR LUMBAR FUSION  11/15/2014  . TONSILLECTOMY      Family Psychiatric History: depression and anxiety: mom   Family History:  Family History  Problem Relation Age of Onset  . Heart disease Mother   . Seizures Mother   . Migraines Mother   . Heart attack Father   . Colon cancer Neg Hx        Thinks his Aunt may have been diagnosed but no first degree relatives    Social History:   Social History   Socioeconomic History  . Marital status: Married    Spouse name: Not on file  . Number of children: 0  . Years of education: GED  . Highest education level: Not on file  Occupational History  . Occupation: Unemployed  Tobacco Use  . Smoking status: Current Every Day Smoker    Packs/day: 0.50    Years: 24.00    Pack years: 12.00    Types: Cigarettes  . Smokeless tobacco: Never Used  . Tobacco comment: 11/15/2014 "down from 1 1/2 ppd in 08/2014"  Vaping Use  . Vaping Use: Never used  Substance and Sexual Activity  . Alcohol use: No    Alcohol/week: 0.0 standard drinks  . Drug use: No  . Sexual activity: Yes    Birth control/protection: Other-see comments    Comment: Wife had tubaligation  Other Topics Concern  . Not on file  Social History Narrative   Lives at home with his wife and  step-children.   Right-handed.   3/4 - 1 two liter of soda per day.   Social Determinants of Health   Financial Resource Strain: Not on file  Food Insecurity: Not on file  Transportation Needs: Not on file  Physical Activity: Not on file  Stress: Not on file  Social Connections: Not on file      Allergies:   Allergies  Allergen Reactions  . Flexeril [Cyclobenzaprine] Other (See Comments)    "seizures"    Metabolic Disorder Labs: No results found for: HGBA1C, MPG No results found for: PROLACTIN No results found for: CHOL, TRIG, HDL, CHOLHDL, VLDL, LDLCALC   Current Medications: Current Outpatient Medications  Medication Sig Dispense Refill   . buPROPion (WELLBUTRIN SR) 150 MG 12 hr tablet Take 1 tablet (150 mg total) by mouth daily. 30 tablet 1  . Cholecalciferol (VITAMIN D PO) Take 25 Units by mouth daily.    . hydrOXYzine (ATARAX/VISTARIL) 10 MG tablet TAKE 1-2 TABLETS EVERY DAY BY MOUTH IF NEEDED 60 tablet 0  . ibuprofen (ADVIL,MOTRIN) 200 MG tablet Take 200 mg by mouth daily as needed.     No current facility-administered medications for this visit.      Psychiatric Specialty Exam: Review of Systems  Cardiovascular: Negative for chest pain.  Musculoskeletal: Positive for back pain.  Psychiatric/Behavioral: Negative for substance abuse.    There were no vitals taken for this visit.There is no height or weight on file to calculate BMI.  General Appearance:   Eye Contact:   Speech:  Slow  Volume:  Decreased  Mood:fair  Affect:  congruent  Thought Process:  Goal Directed  Orientation:  Full (Time, Place, and Person)  Thought Content:  Logical  Suicidal Thoughts:  No  Homicidal Thoughts:  No  Memory:  Immediate;   Fair Recent;   Fair  Judgement:  Fair  Insight:  Fair  Psychomotor Activity:  Concentration:  Concentration: Fair and Attention Span: Fair  Recall:  [employer] of Knowledge:Fair  Language: Fair  Akathisia:    Handed:  Right  AIMS (if indicated):    Assets:  Desire for Improvement  ADL's:  Intact  Cognition: WNL  Sleep:  poor    Treatment Plan Summary: Medication management and Plan as follows  1. Major depression vs dysthymia: fair continue wellbutrin, more concern related to medical co morbidity, following up with GI  2. GAD;f;uictuates, continue distraction,Also,on lyrica now  Fu 80m. Reviewed meds     Thresa Ross, MD 5/10/20223:38 PM

## 2020-10-09 ENCOUNTER — Other Ambulatory Visit (HOSPITAL_COMMUNITY): Payer: Self-pay | Admitting: Psychiatry

## 2020-12-17 ENCOUNTER — Ambulatory Visit (INDEPENDENT_AMBULATORY_CARE_PROVIDER_SITE_OTHER): Payer: 59 | Admitting: Psychiatry

## 2020-12-17 ENCOUNTER — Encounter (HOSPITAL_COMMUNITY): Payer: Self-pay | Admitting: Psychiatry

## 2020-12-17 VITALS — BP 130/88 | Ht 70.0 in | Wt 263.0 lb

## 2020-12-17 DIAGNOSIS — F341 Dysthymic disorder: Secondary | ICD-10-CM

## 2020-12-17 DIAGNOSIS — F411 Generalized anxiety disorder: Secondary | ICD-10-CM | POA: Diagnosis not present

## 2020-12-17 NOTE — Progress Notes (Signed)
[facility] phone visit  Patient Identification: Eduardo Barnes MRN:  [id number] Date of Evaluation:  12/17/2020 Referral Source: primary care Chief Complaint:   depression follow up  Visit Diagnosis:    ICD-10-CM   1. 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 GAD (generalized anxiety disorder)  F41.1        Face to face time with documentation 115 min  History of Present Illness:  45 years old [name] referred initially for management of mood and anxiety On disability due to back condition.  Doing fair mood wise with stress related to GI issues and C fiff has been on vancomycin but still not recovered has pain at times.  Has applied for disability  Wellbutrin keep some balance of the depression  Wife is supportive   Has 5 step kids   Aggravating factor: 5 step kids, fincances, medical comorbidity Alcohol use; remained sober    Past Psychiatric History: depression admitted for depression age 34. Parents were going thru divorce  Previous Psychotropic Medications: No   Substance Abuse History in the last 12 months:  No.  Consequences of Substance Abuse: NA  Past Medical History:  Past Medical History:  Diagnosis Date   Arthritis    "probably a touch in my back" (11/15/2014)   Chronic lower back pain    DDD (degenerative disc disease), lumbar    Diverticulitis Dec 2016   Neck pain    Seizure (HCC)    had 1 seizure after back surgery from "too many muscle relaxers;.On no meds and no more seizure    Past Surgical History:  Procedure Laterality Date   ANTERIOR CERVICAL DECOMP/DISCECTOMY FUSION N/A 08/30/2014   Procedure: ANTERIOR CERVICAL DECOMPRESSION/DISCECTOMY FUSION CERVICAL FIVE-SIX,CERVICAL SIX-SEVEN;  Surgeon: Tia Alert, MD;  Location: [facility];  Service: Neurosurgery;  Laterality: N/A;   COLONOSCOPY WITH PROPOFOL N/A 05/13/2015   Procedure: COLONOSCOPY WITH PROPOFOL;  Surgeon: Corbin Ade, MD;  Location: [facility];  Service: Endoscopy;   Laterality: N/A;  1030   POSTERIOR LUMBAR FUSION  11/15/2014   TONSILLECTOMY      Family Psychiatric History: depression and anxiety: mom   Family History:  Family History  Problem Relation Age of Onset   Heart disease Mother    Seizures Mother    Migraines Mother    Heart attack Father    Colon cancer Neg Hx        Thinks his Aunt may have been diagnosed but no first degree relatives    Social History:   Social History   Socioeconomic History   Marital status: Married    Spouse name: Not on file   Number of children: 0   Years of education: GED   Highest education level: Not on file  Occupational History   Occupation: Unemployed  Tobacco Use   Smoking status: Every Day    Packs/day: 0.50    Years: 24.00    Pack years: 12.00    Types: Cigarettes   Smokeless tobacco: Never   Tobacco comments:    11/15/2014 "down from 1 1/2 ppd in 08/2014"  Vaping Use   Vaping Use: Never used  Substance and Sexual Activity   Alcohol use: No    Alcohol/week: 0.0 standard drinks   Drug use: No   Sexual activity: Yes    Birth control/protection: Other-see comments    Comment: Wife had tubaligation  Other Topics Concern   Not on file  Social History Narrative   Lives at  home with his wife and step-children.   Right-handed.   3/4 - 1 two liter of soda per day.   Social Determinants of Health   Financial Resource Strain: Not on file  Food Insecurity: Not on file  Transportation Needs: Not on file  Physical Activity: Not on file  Stress: Not on file  Social Connections: Not on file      Allergies:   Allergies  Allergen Reactions   Flexeril [Cyclobenzaprine] Other (See Comments)    "seizures"    Metabolic Disorder Labs: No results found for: HGBA1C, MPG No results found for: PROLACTIN No results found for: CHOL, TRIG, HDL, CHOLHDL, VLDL, LDLCALC   Current Medications: Current Outpatient Medications  Medication Sig Dispense Refill   buPROPion (WELLBUTRIN SR) 150 MG  12 hr tablet TAKE 1 TABLET BY MOUTH EVERY DAY 90 tablet 0   Cholecalciferol (VITAMIN D PO) Take 25 Units by mouth daily.     hydrOXYzine (ATARAX/VISTARIL) 10 MG tablet TAKE 1-2 TABLETS EVERY DAY BY MOUTH IF NEEDED 60 tablet 0   ibuprofen (ADVIL,MOTRIN) 200 MG tablet Take 200 mg by mouth daily as needed.     No current facility-administered medications for this visit.      Psychiatric Specialty Exam: Review of Systems  Cardiovascular:  Negative for chest pain.  Musculoskeletal:  Positive for back pain.  Psychiatric/Behavioral:  Negative for substance abuse.    Blood pressure 130/88, height 5\' 10"  (1.778 m), weight 263 lb (119.3 kg).Body mass index is 37.74 kg/m.  General Appearance: Casual  Eye Contact: Fair  Speech:  Slow  Volume:  Decreased  Mood:fair  Affect:  congruent  Thought Process:  Goal Directed  Orientation:  Full (Time, Place, and Person)  Thought Content:  Logical  Suicidal Thoughts:  No  Homicidal Thoughts:  No  Memory:  Immediate;   Fair Recent;   Fair  Judgement:  Fair  Insight:  Fair  Psychomotor Activity:  Concentration:  Concentration: Fair and Attention Span: Fair  Recall:  of Knowledge:Fair  Language: Fair  Akathisia:    Handed:  Right  AIMS (if indicated):    Assets:  Desire for Improvement  ADL's:  Intact  Cognition: WNL  Sleep:  poor    Treatment Plan Summary: Medication management and Plan as follows   1. Major depression vs dysthymia: Gets subdued because of medical comorbidity otherwise Wellbutrin does help some balance continue medication provided supportive therapy  2. GAD; fluctuates continue distraction and positive thinking working on medical comorbidity Follow-up in 3 to 4 months or earlier if needed  [employer], MD 8/16/20223:12 PM

## 2021-02-07 ENCOUNTER — Other Ambulatory Visit (HOSPITAL_COMMUNITY): Payer: Self-pay | Admitting: Psychiatry

## 2021-04-17 ENCOUNTER — Telehealth (INDEPENDENT_AMBULATORY_CARE_PROVIDER_SITE_OTHER): Payer: 59 | Admitting: Psychiatry

## 2021-04-17 ENCOUNTER — Encounter (HOSPITAL_COMMUNITY): Payer: Self-pay | Admitting: Psychiatry

## 2021-04-17 DIAGNOSIS — F410 Panic disorder [episodic paroxysmal anxiety] without agoraphobia: Secondary | ICD-10-CM | POA: Diagnosis not present

## 2021-04-17 DIAGNOSIS — F411 Generalized anxiety disorder: Secondary | ICD-10-CM | POA: Diagnosis not present

## 2021-04-17 DIAGNOSIS — F341 Dysthymic disorder: Secondary | ICD-10-CM | POA: Diagnosis not present

## 2021-04-17 MED ORDER — BUPROPION HCL ER (SR) 150 MG PO TB12: 150.0000 mg | ORAL_TABLET | Freq: Every day | ORAL | 0 refills | Status: DC

## 2021-04-17 NOTE — Progress Notes (Signed)
[facility] phone visit  Patient Identification: Eduardo Barnes MRN:  [id number] Date of Evaluation:  04/17/2021 Referral Source: primary care Chief Complaint:   depression follow up  Visit Diagnosis:    ICD-10-CM   1. 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 GAD (generalized anxiety disorder)  F41.1     3. Panic disorder  F41.0      Virtual Visit via Telephone Note  I connected with Eduardo Barnes on 04/17/21 at  4:00 PM EST by telephone and verified that I am speaking with the correct person using two identifiers.  Location: Patient: home Provider: home office   I discussed the limitations, risks, security and privacy concerns of performing an evaluation and management service by telephone and the availability of in person appointments. I also discussed with the patient that there may be a patient responsible charge related to this service. The patient expressed understanding and agreed to proceed.     I discussed the assessment and treatment plan with the patient. The patient was provided an opportunity to ask questions and all were answered. The patient agreed with the plan and demonstrated an understanding of the instructions.   The patient was advised to call back or seek an in-person evaluation if the symptoms worsen or if the condition fails to improve as anticipated.  I provided 21 minutes of non-face-to-face time during this encounter including chart review, documentation    Thresa Ross, MD   Face to face time with documentation 115 min  History of Present Illness:  45 years old [name] referred initially for management of mood and anxiety On disability due to back condition.   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 Parents were going thru divorce  Previous Psychotropic Medications: No   Substance Abuse History in the last 12 months:  No.  Consequences of Substance Abuse: NA  Past Medical History:  Past Medical History:  Diagnosis Date   Arthritis    "probably a touch in my back" (11/15/2014)   Chronic lower back pain    DDD (degenerative disc disease), lumbar    Diverticulitis Dec 2016   Neck pain    Seizure (HCC)    had 1 seizure after back surgery from "too many muscle relaxers;.On no meds and no more seizure    Past Surgical History:  Procedure Laterality Date   ANTERIOR CERVICAL DECOMP/DISCECTOMY FUSION N/A 08/30/2014   Procedure: ANTERIOR CERVICAL DECOMPRESSION/DISCECTOMY FUSION CERVICAL FIVE-SIX,CERVICAL SIX-SEVEN;  Surgeon: Tia Alert, MD;  Location: [facility];  Service: Neurosurgery;  Laterality: N/A;   COLONOSCOPY WITH PROPOFOL N/A 05/13/2015   Procedure: COLONOSCOPY WITH PROPOFOL;  Surgeon: Corbin Ade, MD;  Location: [facility];  Service: Endoscopy;  Laterality: N/A;  1030   POSTERIOR LUMBAR FUSION  11/15/2014   TONSILLECTOMY      Family Psychiatric History: depression and anxiety: mom   Family History:  Family History  Problem Relation Age of Onset   Heart disease Mother    Seizures Mother    Migraines Mother  Heart attack Father    Colon cancer Neg Hx        Thinks his Aunt may have been diagnosed but no first degree relatives    Social History:   Social History   Socioeconomic History   Marital status: Married    Spouse name: Not on file   Number of children: 0   Years of education: GED   Highest education level: Not on file  Occupational History   Occupation: Unemployed  Tobacco Use   Smoking status: Every Day    Packs/day:  0.50    Years: 24.00    Pack years: 12.00    Types: Cigarettes   Smokeless tobacco: Never   Tobacco comments:    11/15/2014 "down from 1 1/2 ppd in 08/2014"  Vaping Use   Vaping Use: Never used  Substance and Sexual Activity   Alcohol use: No    Alcohol/week: 0.0 standard drinks   Drug use: No   Sexual activity: Yes    Birth control/protection: Other-see comments    Comment: Wife had tubaligation  Other Topics Concern   Not on file  Social History Narrative   Lives at home with his wife and step-children.   Right-handed.   3/4 - 1 two liter of soda per day.   Social Determinants of Health   Financial Resource Strain: Not on file  Food Insecurity: Not on file  Transportation Needs: Not on file  Physical Activity: Not on file  Stress: Not on file  Social Connections: Not on file      Allergies:   Allergies  Allergen Reactions   Flexeril [Cyclobenzaprine] Other (See Comments)    "seizures"    Metabolic Disorder Labs: No results found for: HGBA1C, MPG No results found for: PROLACTIN No results found for: CHOL, TRIG, HDL, CHOLHDL, VLDL, LDLCALC   Current Medications: Current Outpatient Medications  Medication Sig Dispense Refill   buPROPion (WELLBUTRIN SR) 150 MG 12 hr tablet Take 1 tablet (150 mg total) by mouth daily. 90 tablet 0   Cholecalciferol (VITAMIN D PO) Take 25 Units by mouth daily.     hydrOXYzine (ATARAX/VISTARIL) 10 MG tablet TAKE 1-2 TABLETS EVERY DAY BY MOUTH IF NEEDED 60 tablet 0   ibuprofen (ADVIL,MOTRIN) 200 MG tablet Take 200 mg by mouth daily as needed.     No current facility-administered medications for this visit.      Psychiatric Specialty Exam: Review of Systems  Cardiovascular:  Negative for chest pain.  Neurological:  Negative for tremors.  Psychiatric/Behavioral:  Negative for substance abuse.    There were no vitals taken for this visit.There is no height or weight on file to calculate BMI.  General Appearance:   Eye Contact:    Speech:  Slow  Volume:  Decreased  Mood:fair  Affect:    Thought Process:  Goal Directed  Orientation:  Full (Time, Place, and Person)  Thought Content:  Logical  Suicidal Thoughts:  No  Homicidal Thoughts:  No  Memory:  Immediate;   Fair Recent;   Fair  Judgement:  Fair  Insight:  Fair  Psychomotor Activity:  Concentration:  Concentration: Fair and Attention Span: Fair  Recall:  [employer] of Knowledge:Fair  Language: Fair  Akathisia:    Handed:  Right  AIMS (if indicated):    Assets:  Desire for Improvement  ADL's:  Intact  Cognition: WNL  Sleep:  poor    Treatment Plan Summary: Medication management and Plan as follows   Prior documentation reviewed  1. Major depression vs dysthymia: gets stres related to medical co conditions which effect him and limits him Cotntinue fu with pcp Continue wellbutrin  2. GAD; gets stress , takes vistaril only if needed,  Panic attacks: are less often and he works on distraction  Fu 62m Renewed wellbutrin  Thresa Ross, MD 12/15/20224:12 PM

## 2021-06-26 ENCOUNTER — Other Ambulatory Visit (HOSPITAL_COMMUNITY): Payer: Self-pay | Admitting: Psychiatry

## 2021-07-18 ENCOUNTER — Telehealth (HOSPITAL_COMMUNITY): Payer: 59 | Admitting: Psychiatry

## 2021-07-25 ENCOUNTER — Telehealth (HOSPITAL_COMMUNITY): Payer: Self-pay

## 2021-07-25 ENCOUNTER — Telehealth (HOSPITAL_COMMUNITY): Payer: 59 | Admitting: Psychiatry

## 2021-07-25 NOTE — Telephone Encounter (Signed)
Pt called stating that his phone overheated and shut off during the appointment and would like a call back from Dr. Gilmore Laroche.  ?

## 2021-07-28 NOTE — Telephone Encounter (Signed)
I called patient and left vm to see if we can put him in for 2:30p today or an appt for tomorrow.  ?

## 2021-10-06 ENCOUNTER — Other Ambulatory Visit (HOSPITAL_COMMUNITY): Payer: Self-pay | Admitting: Psychiatry

## 2021-12-09 ENCOUNTER — Encounter (HOSPITAL_COMMUNITY): Payer: Self-pay | Admitting: Psychiatry

## 2021-12-09 ENCOUNTER — Ambulatory Visit (INDEPENDENT_AMBULATORY_CARE_PROVIDER_SITE_OTHER): Payer: 59 | Admitting: Psychiatry

## 2021-12-09 VITALS — BP 120/88 | HR 89 | Temp 97.9°F | Ht 70.0 in | Wt 250.0 lb

## 2021-12-09 DIAGNOSIS — F410 Panic disorder [episodic paroxysmal anxiety] without agoraphobia: Secondary | ICD-10-CM | POA: Diagnosis not present

## 2021-12-09 DIAGNOSIS — F341 Dysthymic disorder: Secondary | ICD-10-CM | POA: Diagnosis not present

## 2021-12-09 DIAGNOSIS — Z639 Problem related to primary support group, unspecified: Secondary | ICD-10-CM | POA: Diagnosis not present

## 2021-12-09 DIAGNOSIS — F411 Generalized anxiety disorder: Secondary | ICD-10-CM

## 2021-12-09 MED ORDER — TRAZODONE HCL 50 MG PO TABS: 50.0000 mg | ORAL_TABLET | Freq: Every day | ORAL | 0 refills | Status: DC

## 2021-12-09 MED ORDER — PAROXETINE HCL 10 MG PO TABS: 10.0000 mg | ORAL_TABLET | Freq: Every day | ORAL | 0 refills | Status: DC

## 2021-12-09 NOTE — Progress Notes (Signed)
[facility] phone visit  Patient Identification: Eduardo Barnes MRN:  [id number] Date of Evaluation:  12/09/2021 Referral Source: primary care Chief Complaint:   depression follow up  Visit Diagnosis:    ICD-10-CM   1. 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 GAD (generalized anxiety disorder)  F41.1     3. Panic disorder  F41.0     4. Relationship dysfunction  Z63.9         History of Present Illness:  46 years old WM referred initially for management of mood and anxiety Has been On disability due to back condition.   Patient going thru acute stress, wife cheated on him he found after one year, left home now but distressed, poor sleep, feeling down and anxious Financially going thru difficulty,  He does not have intent to harm him, her or himself was angry a week ago, now feeling distraught and down Living with Aunt but financially difficult for hime   Was very close to step grand kid Modifying factor: Aunt Aggravating factor: finances, wife cheated,  medical comorbidity Alcohol use; remained sober    Past Psychiatric History: depression admitted for depression age 9. Parents were going thru divorce  Previous Psychotropic Medications: No   Substance Abuse History in the last 12 months:  No.  Consequences of Substance Abuse: NA  Past Medical History:  Past Medical History:  Diagnosis Date   Arthritis    "probably a touch in my back" (11/15/2014)   Chronic lower back pain    DDD (degenerative disc disease), lumbar    Diverticulitis Dec 2016   Neck pain    Seizure (HCC)    had 1 seizure after back surgery from "too many muscle relaxers;.On no meds and no more seizure    Past Surgical History:  Procedure Laterality Date   ANTERIOR CERVICAL DECOMP/DISCECTOMY FUSION N/A 08/30/2014   Procedure: ANTERIOR CERVICAL DECOMPRESSION/DISCECTOMY FUSION CERVICAL FIVE-SIX,CERVICAL SIX-SEVEN;  Surgeon: Tia Alert, MD;  Location: [facility];  Service: Neurosurgery;   Laterality: N/A;   COLONOSCOPY WITH PROPOFOL N/A 05/13/2015   Procedure: COLONOSCOPY WITH PROPOFOL;  Surgeon: Corbin Ade, MD;  Location: [facility];  Service: Endoscopy;  Laterality: N/A;  1030   POSTERIOR LUMBAR FUSION  11/15/2014   TONSILLECTOMY      Family Psychiatric History: depression and anxiety: mom   Family History:  Family History  Problem Relation Age of Onset   Heart disease Mother    Seizures Mother    Migraines Mother    Heart attack Father    Colon cancer Neg Hx        Thinks his Aunt may have been diagnosed but no first degree relatives    Social History:   Social History   Socioeconomic History   Marital status: Married    Spouse name: Not on file   Number of children: 0   Years of education: GED   Highest education level: Not on file  Occupational History   Occupation: Unemployed  Tobacco Use   Smoking status: Every Day    Packs/day: 0.50    Years: 24.00    Total pack years: 12.00    Types: Cigarettes   Smokeless tobacco: Never   Tobacco comments:    11/15/2014 "down from 1 1/2 ppd in 08/2014"  Vaping Use   Vaping Use: Never used  Substance and Sexual Activity   Alcohol use: No    Alcohol/week: 0.0 standard drinks of alcohol   Drug use:  No   Sexual activity: Yes    Birth control/protection: Other-see comments    Comment: Wife had tubaligation  Other Topics Concern   Not on file  Social History Narrative   Lives at home with his wife and step-children.   Right-handed.   3/4 - 1 two liter of soda per day.   Social Determinants of Health   Financial Resource Strain: Not on file  Food Insecurity: Not on file  Transportation Needs: Not on file  Physical Activity: Not on file  Stress: Not on file  Social Connections: Not on file      Allergies:   Allergies  Allergen Reactions   Flexeril [Cyclobenzaprine] Other (See Comments)    "seizures"    Metabolic Disorder Labs: No results found for: "HGBA1C", "MPG" No results found for:  "PROLACTIN" No results found for: "CHOL", "TRIG", "HDL", "CHOLHDL", "VLDL", "LDLCALC"   Current Medications: Current Outpatient Medications  Medication Sig Dispense Refill   PARoxetine (PAXIL) 10 MG tablet Take 1 tablet (10 mg total) by mouth daily. 30 tablet 0   traZODone (DESYREL) 50 MG tablet Take 1 tablet (50 mg total) by mouth at bedtime. 30 tablet 0   buPROPion (WELLBUTRIN SR) 150 MG 12 hr tablet TAKE 1 TABLET BY MOUTH EVERY DAY 90 tablet 0   Cholecalciferol (VITAMIN D PO) Take 25 Units by mouth daily.     hydrOXYzine (ATARAX/VISTARIL) 10 MG tablet TAKE 1-2 TABLETS EVERY DAY BY MOUTH IF NEEDED 60 tablet 0   ibuprofen (ADVIL,MOTRIN) 200 MG tablet Take 200 mg by mouth daily as needed.     No current facility-administered medications for this visit.      Psychiatric Specialty Exam: Review of Systems  Cardiovascular:  Negative for chest pain.  Neurological:  Negative for tremors.  Psychiatric/Behavioral:  Positive for depression. Negative for substance abuse. The patient is nervous/anxious and has insomnia.     Blood pressure 120/88, pulse 89, temperature 97.9 F (36.6 C), height 5\' 10"  (1.778 m), weight 250 lb (113.4 kg).Body mass index is 35.87 kg/m.  General Appearance: casual  Eye Contact: fair  Speech:  Slow  Volume:  Decreased  Mood: depressed  Affect:    Thought Process:  Goal Directed  Orientation:  Full (Time, Place, and Person)  Thought Content:  Logical  Suicidal Thoughts:  No  Homicidal Thoughts:  No  Memory:  Immediate;   Fair Recent;   Fair  Judgement:  Fair  Insight:  Fair  Psychomotor Activity:  Concentration:  Concentration: Fair and Attention Span: Fair  Recall:  of Knowledge:Fair  Language: Fair  Akathisia:    Handed:  Right  AIMS (if indicated):    Assets:  Desire for Improvement  ADL's:  Intact  Cognition: WNL  Sleep:  poor    Treatment Plan Summary: Medication management and Plan as follows     Prior documentation  reviewed  1. Major depression recurrent: continue wellbutrin add paxil for depression,  Povided supportive therapy, recommend counselling to schedule      2. GAD; stressed as above, add paxil,   Panic attacks: getting anxious , Aunt is helpful, circumstances include financially strsined,  Discussed to appy for disaiblity, food stamps or other services, schedule therapy  Relationship : now seperated, provided supportive therapy having sleep concerns and anxiety  Add paxil and trazadone for sleep and anxiety   Fu 3 -4 weeks or earlier if needed Direct care time spent in office 25 min including chart review and documentation Not  suicidal  Thresa Ross, MD 8/8/20233:43 PM

## 2021-12-10 ENCOUNTER — Telehealth (HOSPITAL_COMMUNITY): Payer: Self-pay | Admitting: Psychiatry

## 2021-12-10 NOTE — Telephone Encounter (Signed)
Patient called stating he "read online that Paxil and Wellbutrin should not be taken together because the Wellbutrin can cause the level of Paxil to increase which may cause seizures". Requesting provider be contacted and confirm that it is ok for him to take both medications.

## 2021-12-16 ENCOUNTER — Ambulatory Visit (HOSPITAL_COMMUNITY): Payer: 59 | Admitting: Licensed Clinical Social Worker

## 2021-12-17 ENCOUNTER — Other Ambulatory Visit (HOSPITAL_COMMUNITY): Payer: Self-pay | Admitting: Psychiatry

## 2021-12-29 ENCOUNTER — Telehealth (HOSPITAL_COMMUNITY): Payer: Self-pay | Admitting: Psychiatry

## 2021-12-29 NOTE — Telephone Encounter (Signed)
Patient left voicemail afternoon of 12/26/2021 requesting call from provider to discuss medication issues and change.  States Paxil is causing "sweats, stomach problems, constant diarrhea, and liver pain." Wants to discuss change of medication.

## 2021-12-30 ENCOUNTER — Ambulatory Visit (INDEPENDENT_AMBULATORY_CARE_PROVIDER_SITE_OTHER): Payer: 59 | Admitting: Licensed Clinical Social Worker

## 2021-12-30 DIAGNOSIS — F410 Panic disorder [episodic paroxysmal anxiety] without agoraphobia: Secondary | ICD-10-CM

## 2021-12-30 DIAGNOSIS — F331 Major depressive disorder, recurrent, moderate: Secondary | ICD-10-CM

## 2021-12-30 DIAGNOSIS — F411 Generalized anxiety disorder: Secondary | ICD-10-CM | POA: Diagnosis not present

## 2021-12-30 DIAGNOSIS — F341 Dysthymic disorder: Secondary | ICD-10-CM

## 2021-12-30 DIAGNOSIS — Z639 Problem related to primary support group, unspecified: Secondary | ICD-10-CM
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
## 2021-12-31 ENCOUNTER — Other Ambulatory Visit (HOSPITAL_COMMUNITY): Payer: Self-pay | Admitting: Psychiatry

## 2022-01-06 ENCOUNTER — Encounter (HOSPITAL_COMMUNITY): Payer: Self-pay | Admitting: Psychiatry

## 2022-01-06 ENCOUNTER — Ambulatory Visit (HOSPITAL_COMMUNITY)
Admission: EM | Admit: 2022-01-06 | Discharge: 2022-01-07 | Disposition: A | Discharge status: Other Acute Inpatient Hospital | Payer: 59 | Attending: Nurse Practitioner | Admitting: Nurse Practitioner

## 2022-01-06 ENCOUNTER — Ambulatory Visit (HOSPITAL_COMMUNITY)
Admission: RE | Admit: 2022-01-06 | Discharge: 2022-01-06 | Disposition: A | Discharge status: Home / Self Care | Payer: 59 | Attending: Psychiatry | Admitting: Psychiatry

## 2022-01-06 DIAGNOSIS — F32A Depression, unspecified: Secondary | ICD-10-CM | POA: Diagnosis not present

## 2022-01-06 DIAGNOSIS — Z20822 Contact with and (suspected) exposure to covid-19: Secondary | ICD-10-CM | POA: Insufficient documentation

## 2022-01-06 DIAGNOSIS — F339 Major depressive disorder, recurrent, unspecified: Secondary | ICD-10-CM

## 2022-01-06 DIAGNOSIS — F341 Dysthymic disorder: Secondary | ICD-10-CM

## 2022-01-06 HISTORY — DX: Other symptoms and signs involving emotional state: R45.89

## 2022-01-06 HISTORY — DX: Major depressive disorder, single episode, unspecified: F32.9

## 2022-01-06 LAB — POCT URINE DRUG SCREEN - MANUAL ENTRY (I-SCREEN)
POC Amphetamine UR: NOT DETECTED
POC Buprenorphine (BUP): NOT DETECTED
POC Cocaine UR: NOT DETECTED
POC Marijuana UR: POSITIVE — AB
POC Methadone UR: NOT DETECTED
POC Methamphetamine UR: NOT DETECTED
POC Morphine: NOT DETECTED
POC Oxazepam (BZO): NOT DETECTED
POC Oxycodone UR: NOT DETECTED
POC Secobarbital (BAR): NOT DETECTED

## 2022-01-06 LAB — SARS CORONAVIRUS 2 BY RT PCR: SARS Coronavirus 2 by RT PCR: NEGATIVE

## 2022-01-06 MED ORDER — HYDROXYZINE HCL 25 MG PO TABS
25.0000 mg | ORAL_TABLET | Freq: Three times a day (TID) | ORAL | Status: DC | PRN
  Administered 2022-01-07: 25 mg via ORAL
  Filled 2022-01-06: qty 1

## 2022-01-06 MED ORDER — ACETAMINOPHEN 325 MG PO TABS: 650.0000 mg | ORAL_TABLET | Freq: Four times a day (QID) | ORAL | Status: DC | PRN

## 2022-01-06 MED ORDER — MAGNESIUM HYDROXIDE 400 MG/5ML PO SUSP: 30.0000 mL | Freq: Every day | ORAL | Status: DC | PRN

## 2022-01-06 MED ORDER — BUPROPION HCL ER (SR) 150 MG PO TB12
150.0000 mg | ORAL_TABLET | Freq: Every day | ORAL | Status: DC
  Administered 2022-01-07: 150 mg via ORAL
  Filled 2022-01-06: qty 1

## 2022-01-06 MED ORDER — TRAZODONE HCL 50 MG PO TABS
50.0000 mg | ORAL_TABLET | Freq: Every evening | ORAL | Status: DC | PRN
  Administered 2022-01-07: 50 mg via ORAL
  Filled 2022-01-06: qty 1

## 2022-01-06 MED ORDER — ALUM & MAG HYDROXIDE-SIMETH 200-200-20 MG/5ML PO SUSP: 30.0000 mL | ORAL | Status: DC | PRN

## 2022-01-06 MED ORDER — NICOTINE 14 MG/24HR TD PT24
14.0000 mg | MEDICATED_PATCH | Freq: Every day | TRANSDERMAL | Status: DC
Start: 2022-01-07 — End: 2022-01-07
  Administered 2022-01-07: 14 mg via TRANSDERMAL
  Filled 2022-01-06: qty 1

## 2022-01-06 NOTE — ED Provider Notes (Cosign Needed Addendum)
[facility] - Inpatient Urgent Care Continuous Assessment Admission H&P  Date: 01/06/22 Patient Name: Eduardo Barnes MRN: [id number] Chief Complaint: " Wishing I was dead had me depressed".    Diagnoses:  Final diagnoses:  Dysthymia (or depressive neurosis)    HPI: Eduardo Barnes is a 46 year old male with past psychiatric history of MDD, GAD who presented voluntarily to [facility] as a transfer from [facility] with complaints of depression and passive SI with no plan/intent.  Patient denies HI, denies AVH.  Patient reports about a month and a half ago he found out that his wife has been cheating on him for about a year.   Patient reports "I have been trying to deal with it on my own, I'm not really having suicidal thoughts but wishing I was dead had me depressed". Patient endorses his depressive symptoms as poor sleep, shaky hands, increased anxiety, and difficulty concentrating.  Patient reports ""I tried to put job applications, I cannot remember dates, it is consuming me, it is driving me crazy, and I have to have someone to talk to".   Patient reports " I am staying with my 70 year old aunt, and she keeps it hot in the house, it feels like 90 degrees and is driving me crazy too, and I have nowhere else to go".  Patient denies access or presence of weapons at home.  Patient reports he is currently unemployed, but was previously on disability and now "fighting to get back on disability for the past 2 years because they said my condition has improved but I have degenerative joint disease and I have no money, nothing, I am at the end of my road, I need more coping skills".   Patient reports he is currently being followed by psychiatric services.  Patient reports he sees a therapist Cordella Register in [place] and last spoke with her 2 weeks ago.  Patient reports he also sees Dr. Paticia Stack and last saw him a month ago.  Patient reports he is prescribed bupropion 150 mg every morning for depression and trazodone for  sleep.  Pt reports his appetite as fair, and his sleep is poor.  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  Patient reports he smokes 5 cigarettes a day.  Patient denies recent inpatient hospitalization for his mental health.  Patient reports being hospitalized a long time ago at age 57 but reports unable to remember why.  Support and encouragement and reassurance provided about ongoing stressors.  On evaluation, patient is alert, oriented x 4, and cooperative. Speech is clear, coherent and logical. Pt appears well groomed. Eye contact is good. Mood is depressed, affect is congruent with mood. Thought process is logical and thought content is coherent. Pt endorses passive SI, denies HI/AVH. There is no indication that the patient is responding to internal stimuli. No delusions elicited during this assessment.    PHQ 2-9:  Flowsheet Row Video Visit from 07/03/2020 in [place] Office Visit from 05/12/2017 in [place]  Thoughts that you would be better off dead, or of hurting yourself in some way Not at all Several days  PHQ-9 Total Score 8 17       Flowsheet Row OP Visit from 01/06/2022 in [place] Office Visit from 12/09/2021 in [place] Video Visit from 04/17/2021 in [place] No Risk  No Risk        Total Time spent with patient: 20 minutes  Musculoskeletal  Strength & Muscle Tone: within normal limits Gait & Station: normal Patient leans: N/A  Psychiatric Specialty Exam  Presentation General Appearance: Appropriate for Environment  Eye Contact:Good  Speech:Clear and Coherent  Speech Volume:Normal  Handedness:Right   Mood and Affect  Mood:Depressed  Affect:Congruent   Thought Process   Thought Processes:Coherent  Descriptions of Associations:Intact  Orientation:Full (Time, Place and Person)  Thought Content:Logical    Hallucinations:Hallucinations: None  Ideas of Reference:None  Suicidal Thoughts:Suicidal Thoughts: Yes, Passive SI Passive Intent and/or Plan: Without Plan  Homicidal Thoughts:Homicidal Thoughts: No   Sensorium  Memory:Immediate Fair  Judgment:Intact  Insight:Present   Executive Functions  Concentration:Good  Attention Span:Good  [place] of Knowledge:Good  Language:Good   Psychomotor Activity  Psychomotor Activity:Psychomotor Activity: Normal   Assets  Assets:Communication Skills; Desire for Improvement; Social Support   Sleep  Sleep:Sleep: Poor   Nutritional Assessment (For OBS and FBC admissions only) Has the patient had a weight loss or gain of 10 pounds or more in the last 3 months?: No Has the patient had a decrease in food intake/or appetite?: No Does the patient have dental problems?: No Does the patient have eating habits or behaviors that may be indicators of an eating disorder including binging or inducing vomiting?: No Has the patient recently lost weight without trying?: 0 Has the patient been eating poorly because of a decreased appetite?: 0 Malnutrition Screening Tool Score: 0    Physical Exam Constitutional:      General: He is not in acute distress.    Appearance: He is not diaphoretic.  HENT:     Head: Normocephalic.     Right Ear: External ear normal.     Left Ear: External ear normal.     Nose: No congestion.  Eyes:     General:        Right eye: No discharge.        Left eye: No discharge.  Cardiovascular:     Rate and Rhythm: Normal rate.  Pulmonary:     Effort: No respiratory distress.  Chest:     Chest wall: No tenderness.  Neurological:     Mental Status: He is alert and oriented to person, place, and time.  Psychiatric:        Attention and Perception: Attention and  perception normal.        Mood and Affect: Mood is depressed. Mood is not anxious.        Speech: Speech normal.        Behavior: Behavior is cooperative.        Thought Content: Thought content is not paranoid or delusional. Thought content includes suicidal ideation. Thought content does not include homicidal ideation. Thought content does not include homicidal or suicidal plan.        Cognition and Memory: Cognition and memory normal.        Judgment: Judgment normal.    Review of Systems  Constitutional:  Negative for chills, diaphoresis and fever.  HENT:  Negative for congestion.   Eyes:  Negative for discharge.  Respiratory:  Negative for cough, shortness of breath and wheezing.   Cardiovascular:  Negative for chest pain and palpitations.  Gastrointestinal:  Negative for diarrhea, nausea and vomiting.  Musculoskeletal:  Positive for back pain.  Neurological:  Negative for seizures, loss of consciousness, weakness and headaches.  Psychiatric/Behavioral:  Positive for depression and suicidal  ideas. Negative for hallucinations and substance abuse. The patient is not nervous/anxious.     Blood pressure (!) 146/105, pulse 97, temperature 98.1 F (36.7 C), temperature source Tympanic, resp. rate 18, SpO2 97 %. There is no height or weight on file to calculate BMI.  Past Psychiatric History: See H & P  Is the patient at risk to self? Yes  Has the patient been a risk to self in the past 6 months? Yes .    Has the patient been a risk to self within the distant past? Yes   Is the patient a risk to others? No   Has the patient been a risk to others in the past 6 months? No   Has the patient been a risk to others within the distant past? No   Past Medical History:  Past Medical History:  Diagnosis Date   Arthritis    "probably a touch in my back" (11/15/2014)   Chronic lower back pain    DDD (degenerative disc disease), lumbar    Difficulty coping    Diverticulitis 04/04/2015   MDD  (major depressive disorder)    Neck pain    Seizure (HCC)    had 1 seizure after back surgery from "too many muscle relaxers;.On no meds and no more seizure    Past Surgical History:  Procedure Laterality Date   ANTERIOR CERVICAL DECOMP/DISCECTOMY FUSION N/A 08/30/2014   Procedure: ANTERIOR CERVICAL DECOMPRESSION/DISCECTOMY FUSION CERVICAL FIVE-SIX,CERVICAL SIX-SEVEN;  Surgeon: Tia Alert, MD;  Location: [facility];  Service: Neurosurgery;  Laterality: N/A;   COLONOSCOPY WITH PROPOFOL N/A 05/13/2015   Procedure: COLONOSCOPY WITH PROPOFOL;  Surgeon: Corbin Ade, MD;  Location: [facility];  Service: Endoscopy;  Laterality: N/A;  1030   POSTERIOR LUMBAR FUSION  11/15/2014   TONSILLECTOMY      Family History:  Family History  Problem Relation Age of Onset   Heart disease Mother    Seizures Mother    Migraines Mother    Heart attack Father    Colon cancer Neg Hx        Thinks his Aunt may have been diagnosed but no first degree relatives    Social History:  Social History   Socioeconomic History   Marital status: Married    Spouse name: Not on file   Number of children: 0   Years of education: GED   Highest education level: Not on file  Occupational History   Occupation: Unemployed  Tobacco Use   Smoking status: Every Day    Packs/day: 0.50    Years: 24.00    Total pack years: 12.00    Types: Cigarettes   Smokeless tobacco: Never   Tobacco comments:    11/15/2014 "down from 1 1/2 ppd in 08/2014"  Vaping Use   Vaping Use: Never used  Substance and Sexual Activity   Alcohol use: No    Alcohol/week: 0.0 standard drinks of alcohol   Drug use: No   Sexual activity: Yes    Birth control/protection: Other-see comments    Comment: Wife had tubaligation  Other Topics Concern   Not on file  Social History Narrative   Lives at home with his wife and step-children.   Right-handed.   3/4 - 1 two liter of soda per day.   Social Determinants of Health   Financial  Resource Strain: Not on file  Food Insecurity: Not on file  Transportation Needs: Not on file  Physical Activity: Not on file  Stress:  Not on file  Social Connections: Not on file  Intimate Partner Violence: Not on file    SDOH:  SDOH Screenings   Depression (PHQ2-9): Medium Risk (07/03/2020)  Tobacco Use: High Risk (01/06/2022)    Last Labs:  Hospital Outpatient Visit on 01/06/2022  Component Date Value Ref Range Status   SARS Coronavirus 2 by RT PCR 01/06/2022 NEGATIVE  NEGATIVE Final   Comment: (NOTE) SARS-CoV-2 target nucleic acids are NOT DETECTED.  The SARS-CoV-2 RNA is generally detectable in upper and lower respiratory specimens during the acute phase of infection. The lowest concentration of SARS-CoV-2 viral copies this assay can detect is 250 copies / mL. A negative result does not preclude SARS-CoV-2 infection and should not be used as the sole basis for treatment or other patient management decisions.  A negative result may occur with improper specimen collection / handling, submission of specimen other than nasopharyngeal swab, presence of viral mutation(s) within the areas targeted by this assay, and inadequate number of viral copies (<250 copies / mL). A negative result must be combined with clinical observations, patient history, and epidemiological information.  Fact Sheet for Patients:   RoadLapTop.co.za  Fact Sheet for Healthcare Providers: http://kim-miller.com/  This test is not yet approved or                           cleared by the [place] FDA and has been authorized for detection and/or diagnosis of SARS-CoV-2 by FDA under an Emergency Use Authorization (EUA).  This EUA will remain in effect (meaning this test can be used) for the duration of the COVID-19 declaration under Section 564(b)(1) of the Act, 21 U.S.C. section 360bbb-3(b)(1), unless the authorization is terminated or revoked  sooner.  Performed at [facility], [address], [place], [place] [address]     Allergies: Flexeril [cyclobenzaprine]  PTA Medications: (Not in a hospital admission)  Prior to Admission medications   Medication Sig Start Date End Date Taking? Authorizing Provider  buPROPion (WELLBUTRIN SR) 150 MG 12 hr tablet TAKE 1 TABLET BY MOUTH EVERY DAY 12/18/21   Thresa Ross, MD  Cholecalciferol (VITAMIN D PO) Take 25 Units by mouth daily.    [provider]  hydrOXYzine (ATARAX/VISTARIL) 10 MG tablet TAKE 1-2 TABLETS EVERY DAY BY MOUTH IF NEEDED 06/16/19   Thresa Ross, MD  ibuprofen (ADVIL,MOTRIN) 200 MG tablet Take 200 mg by mouth daily as needed.    [provider]  traZODone (DESYREL) 50 MG tablet TAKE 1 TABLET BY MOUTH EVERYDAY AT BEDTIME 12/31/21   Thresa Ross, MD  desvenlafaxine (PRISTIQ) 50 MG 24 hr tablet Take 1 tablet (50 mg total) by mouth daily. 11/16/19 11/20/19  Thresa Ross, MD  DULoxetine (CYMBALTA) 30 MG capsule Take 1 capsule (30 mg total) by mouth daily. 01/27/18 07/10/19  Thresa Ross, MD  escitalopram (LEXAPRO) 10 MG tablet Take 1 tablet (10 mg total) by mouth daily. 06/02/18 07/10/19  Thresa Ross, MD  gabapentin (NEURONTIN) 100 MG capsule TAKE 1 CAPSULE BY MOUTH THREE TIMES A DAY 07/10/19 12/19/19  Thresa Ross, MD  PARoxetine (PAXIL) 10 MG tablet Take 1 tablet (10 mg total) by mouth daily. 12/09/21 12/30/21  Thresa Ross, MD    Medical Decision Making  Recommend admission to the continuous observation unit overnight for safety monitoring and re-eval the a.m.  Lab Orders         Resp Panel by RT-PCR (Flu A&B, Covid) Anterior Nasal Swab  CBC with Differential/Platelet         Comprehensive metabolic panel         Hemoglobin A1c         Lipid panel         TSH         POCT Urine Drug Screen - (I-Screen)       Home medications restarted this encounter -Wellbutrin SR 150 mg p.o. daily for depression  NicoDerm CQ 14 mg  transdermal patch.  Other PRNs -Tylenol 650 mg p.o. every 6 hours as needed pain -Maalox 30 mL p.o. every 4 hours as needed indigestion -Atarax 25 mg p.o. 3 times daily as needed anxiety -MOM 30 mL p.o. daily as needed constipation -Trazodone 50 mg p.o. nightly as needed sleep  Recommendations  Based on my evaluation the patient does not appear to have an emergency medical condition.  Recommend admission to continues observation unit overnight.  Re-eval in the a.m. for SI or HI AVH.  Randon Goldsmith, NP 01/06/22  11:23 PM

## 2022-01-06 NOTE — H&P (Signed)
Behavioral Health Medical Screening Exam  Eduardo Barnes is a 46 y.o. male. With a hisotry of depression, anxiety, malabsorption disorder.  According to the patient it has been a month and a half when he found out that his wife was cheating on him,  this make him feel overwhelm.  According to patient he tried to go back on disability and he is having a hard time getting back on it.  Patient stated he is now living with his aunt since he and his wife has been having marital problems.   Prior visit confirm that patient was seen for the same situation.  Per the patient he is seeing a psychiatrist Dr.Akhtar,, and he also sees a therapist Corrie Dandy.  Patient stated he is currently taking BuSpar, and trazodone.  Face-to-face observation of patient, patient is alert and oriented x 4, speech is clear, maintained minimal eye contact.  Patient mood is depressed, affect congruent with mood patient denies SI, HI, AVH or paranoia.  Patient denies alcohol use.  Patient reports he smoked 1/4 pack of cigarettes daily.  Patient is currently unemployed.  Stating that he is trying to get back on disability.  According to patient he just needs someone to talk to as he is feeling overwhelmed. Will transfer the patient to [facility] for observation,  spoke with NP [place] and she agree to admit patient for overnight observation.   Recommend observation  Total Time spent with patient: 20 minutes  Psychiatric Specialty Exam:  Presentation  General Appearance: No data recorded Eye Contact:No data recorded Speech:No data recorded Speech Volume:No data recorded Handedness:No data recorded  Mood and Affect  Mood:No data recorded Affect:No data recorded  Thought Process  Thought Processes:No data recorded Descriptions of Associations:No data recorded Orientation:No data recorded Thought Content:No data recorded History of Schizophrenia/Schizoaffective disorder:No data recorded Duration of Psychotic Symptoms:No data  recorded Hallucinations:No data recorded Ideas of Reference:No data recorded Suicidal Thoughts:No data recorded Homicidal Thoughts:No data recorded  Sensorium  Memory:No data recorded Judgment:No data recorded Insight:No data recorded  Executive Functions  Concentration:No data recorded Attention Span:No data recorded Recall:No data recorded Fund of Knowledge:No data recorded Language:No data recorded  Psychomotor Activity  Psychomotor Activity:No data recorded  Assets  Assets:No data recorded  Sleep  Sleep:No data recorded   Physical Exam: Physical Exam HENT:     Head: Normocephalic.     Nose: Nose normal.  Cardiovascular:     Rate and Rhythm: Normal rate.  Pulmonary:     Effort: Pulmonary effort is normal.  Musculoskeletal:        General: Normal range of motion.     Cervical back: Normal range of motion.  Skin:    General: Skin is warm.  Neurological:     General: No focal deficit present.     Mental Status: He is alert.  Psychiatric:        Mood and Affect: Mood normal.        Behavior: Behavior normal.    Review of Systems  Constitutional: Negative.   HENT: Negative.    Eyes: Negative.   Respiratory: Negative.    Cardiovascular: Negative.   Gastrointestinal: Negative.   Genitourinary: Negative.   Musculoskeletal: Negative.   Skin: Negative.   Neurological: Negative.   Endo/Heme/Allergies: Negative.   Psychiatric/Behavioral:  Positive for depression. The patient is nervous/anxious.    There were no vitals taken for this visit. There is no height or weight on file to calculate BMI.  Musculoskeletal: Strength & Muscle Tone: within  normal limits Gait & Station: normal Patient leans: N/A  [place] Scale:  Flowsheet Row [facility] Office Visit from 12/09/2021 in [facility] AT [place] Video Visit from 04/17/2021 in [facility] AT  [place]  C-SSRS RISK CATEGORY Low Risk No Risk No Risk       Recommendations:  Based on my evaluation the patient appears to have an emergency medical condition for which I recommend the patient be transferred to the emergency department for further evaluation.  Sindy Guadeloupe, NP 01/06/2022, 9:13 PM

## 2022-01-07 ENCOUNTER — Other Ambulatory Visit: Payer: Self-pay

## 2022-01-07 ENCOUNTER — Inpatient Hospital Stay (HOSPITAL_COMMUNITY)
Admission: AD | Admit: 2022-01-07 | Discharge: 2022-01-12 | DRG: 885 | Disposition: A | Discharge status: Home / Self Care | Payer: 59 | Source: Intra-hospital | Attending: Psychiatry | Admitting: Psychiatry

## 2022-01-07 ENCOUNTER — Encounter (HOSPITAL_COMMUNITY): Payer: Self-pay | Admitting: Family

## 2022-01-07 DIAGNOSIS — G4700 Insomnia, unspecified: Secondary | ICD-10-CM | POA: Diagnosis present

## 2022-01-07 DIAGNOSIS — K219 Gastro-esophageal reflux disease without esophagitis: Secondary | ICD-10-CM | POA: Diagnosis present

## 2022-01-07 DIAGNOSIS — Z560 Unemployment, unspecified: Secondary | ICD-10-CM

## 2022-01-07 DIAGNOSIS — Z8249 Family history of ischemic heart disease and other diseases of the circulatory system: Secondary | ICD-10-CM

## 2022-01-07 DIAGNOSIS — Z981 Arthrodesis status: Secondary | ICD-10-CM

## 2022-01-07 DIAGNOSIS — F4010 Social phobia, unspecified: Secondary | ICD-10-CM | POA: Diagnosis present

## 2022-01-07 DIAGNOSIS — Z79899 Other long term (current) drug therapy: Secondary | ICD-10-CM

## 2022-01-07 DIAGNOSIS — F332 Major depressive disorder, recurrent severe without psychotic features: Secondary | ICD-10-CM | POA: Diagnosis present

## 2022-01-07 DIAGNOSIS — F411 Generalized anxiety disorder: Secondary | ICD-10-CM | POA: Diagnosis present

## 2022-01-07 DIAGNOSIS — E785 Hyperlipidemia, unspecified: Secondary | ICD-10-CM | POA: Diagnosis present

## 2022-01-07 DIAGNOSIS — F17210 Nicotine dependence, cigarettes, uncomplicated: Secondary | ICD-10-CM | POA: Diagnosis present

## 2022-01-07 DIAGNOSIS — M5136 Other intervertebral disc degeneration, lumbar region: Secondary | ICD-10-CM | POA: Diagnosis present

## 2022-01-07 DIAGNOSIS — Z818 Family history of other mental and behavioral disorders: Secondary | ICD-10-CM

## 2022-01-07 DIAGNOSIS — R45851 Suicidal ideations: Secondary | ICD-10-CM | POA: Diagnosis present

## 2022-01-07 LAB — LIPID PANEL
Cholesterol: 187 mg/dL (ref 0–200)
HDL: 33 mg/dL — ABNORMAL LOW (ref >40)
LDL Cholesterol: 117 mg/dL — ABNORMAL HIGH (ref 0–99)
Total CHOL/HDL Ratio: 5.7 RATIO
Triglycerides: 185 mg/dL — ABNORMAL HIGH (ref <150)
VLDL: 37 mg/dL (ref 0–40)

## 2022-01-07 LAB — CBC WITH DIFFERENTIAL/PLATELET
Abs Immature Granulocytes: 0.06 10*3/uL (ref 0.00–0.07)
Basophils Absolute: 0.1 10*3/uL (ref 0.0–0.1)
Basophils Relative: 0 %
Eosinophils Absolute: 0.2 10*3/uL (ref 0.0–0.5)
Eosinophils Relative: 1 %
HCT: 47.2 % (ref 39.0–52.0)
Hemoglobin: 15.7 g/dL (ref 13.0–17.0)
Immature Granulocytes: 0 %
Lymphocytes Relative: 27 %
Lymphs Abs: 4.5 10*3/uL — ABNORMAL HIGH (ref 0.7–4.0)
MCH: 29.1 pg (ref 26.0–34.0)
MCHC: 33.3 g/dL (ref 30.0–36.0)
MCV: 87.6 fL (ref 80.0–100.0)
Monocytes Absolute: 1.1 10*3/uL — ABNORMAL HIGH (ref 0.1–1.0)
Monocytes Relative: 7 %
Neutro Abs: 10.6 10*3/uL — ABNORMAL HIGH (ref 1.7–7.7)
Neutrophils Relative %: 65 %
Platelets: 397 10*3/uL (ref 150–400)
RBC: 5.39 MIL/uL (ref 4.22–5.81)
RDW: 14.1 % (ref 11.5–15.5)
WBC: 16.5 10*3/uL — ABNORMAL HIGH (ref 4.0–10.5)
nRBC: 0 % (ref 0.0–0.2)

## 2022-01-07 LAB — COMPREHENSIVE METABOLIC PANEL
ALT: 31 U/L (ref 0–44)
AST: 21 U/L (ref 15–41)
Albumin: 3.9 g/dL (ref 3.5–5.0)
Alkaline Phosphatase: 90 U/L (ref 38–126)
Anion gap: 11 (ref 5–15)
BUN: 16 mg/dL (ref 6–20)
CO2: 22 mmol/L (ref 22–32)
Calcium: 9.5 mg/dL (ref 8.9–10.3)
Chloride: 105 mmol/L (ref 98–111)
Creatinine, Ser: 1.23 mg/dL (ref 0.61–1.24)
GFR, Estimated: >60 mL/min (ref >60)
Glucose, Bld: 102 mg/dL — ABNORMAL HIGH (ref 70–99)
Potassium: 4.1 mmol/L (ref 3.5–5.1)
Sodium: 138 mmol/L (ref 135–145)
Total Bilirubin: 0.5 mg/dL (ref 0.3–1.2)
Total Protein: 7.1 g/dL (ref 6.5–8.1)

## 2022-01-07 LAB — HEMOGLOBIN A1C
Hgb A1c MFr Bld: 5.4 % (ref 4.8–5.6)
Mean Plasma Glucose: 108.28 mg/dL

## 2022-01-07 LAB — TSH: TSH: 4.219 u[IU]/mL (ref 0.350–4.500)

## 2022-01-07 MED ORDER — ACETAMINOPHEN 325 MG PO TABS: 650.0000 mg | ORAL_TABLET | Freq: Four times a day (QID) | ORAL | Status: DC | PRN

## 2022-01-07 MED ORDER — COLESTIPOL HCL 1 G PO TABS
1.0000 g | ORAL_TABLET | Freq: Every day | ORAL | Status: DC
  Administered 2022-01-07 – 2022-01-11 (×5): 1 g via ORAL
  Filled 2022-01-07 (×7): qty 1

## 2022-01-07 MED ORDER — COLESTIPOL HCL 1 G PO TABS
1.0000 g | ORAL_TABLET | Freq: Once | ORAL | Status: DC
  Filled 2022-01-07: qty 1

## 2022-01-07 MED ORDER — BUPROPION HCL ER (SR) 150 MG PO TB12
150.0000 mg | ORAL_TABLET | Freq: Every day | ORAL | Status: DC
  Administered 2022-01-08: 150 mg via ORAL
  Filled 2022-01-07 (×3): qty 1

## 2022-01-07 MED ORDER — ALUM & MAG HYDROXIDE-SIMETH 200-200-20 MG/5ML PO SUSP: 30.0000 mL | ORAL | Status: DC | PRN

## 2022-01-07 MED ORDER — HYDROXYZINE HCL 25 MG PO TABS
25.0000 mg | ORAL_TABLET | Freq: Three times a day (TID) | ORAL | Status: DC | PRN
Start: 2022-01-07 — End: 2022-01-12
  Filled 2022-01-07 (×2): qty 1

## 2022-01-07 MED ORDER — MAGNESIUM HYDROXIDE 400 MG/5ML PO SUSP: 30.0000 mL | Freq: Every day | ORAL | Status: DC | PRN

## 2022-01-07 MED ORDER — COLESTIPOL HCL 1 G PO TABS: 1.0000 g | ORAL_TABLET | Freq: Every day | ORAL | Status: DC

## 2022-01-07 MED ORDER — ENSURE ENLIVE PO LIQD
237.0000 mL | Freq: Two times a day (BID) | ORAL | Status: DC
  Administered 2022-01-12: 237 mL via ORAL
  Filled 2022-01-07 (×13): qty 237

## 2022-01-07 MED ORDER — TRAZODONE HCL 50 MG PO TABS
50.0000 mg | ORAL_TABLET | Freq: Every evening | ORAL | Status: DC | PRN
Start: 2022-01-07 — End: 2022-01-08
  Filled 2022-01-07: qty 1

## 2022-01-07 MED ORDER — NICOTINE 14 MG/24HR TD PT24
14.0000 mg | MEDICATED_PATCH | Freq: Every day | TRANSDERMAL | Status: DC
  Administered 2022-01-08 – 2022-01-12 (×5): 14 mg via TRANSDERMAL
  Filled 2022-01-07 (×8): qty 1

## 2022-01-07 MED ORDER — FAMOTIDINE 20 MG PO TABS
20.0000 mg | ORAL_TABLET | Freq: Every day | ORAL | Status: DC
  Administered 2022-01-07 – 2022-01-12 (×6): 20 mg via ORAL
  Filled 2022-01-07 (×10): qty 1

## 2022-01-07 NOTE — ED Provider Notes (Signed)
Behavioral Health Progress Note  Date and Time: 01/07/2022 10:39 AM Name: Eduardo Barnes MRN:  [id number]  Subjective: Patient states "I am having a hard time coping with my separation, I feel like I need new coping methods."  Patient is reassessed, face-to-face, by nurse practitioner.  He is reclined in observation area upon my approach, appears asleep.  Patient is alert and oriented, pleasant and cooperative during assessment.  He presents with depressed mood, congruent affect.  [name] reports recent stressors include a separation from his wife of 14 years on 11/26/2021.  He reports he was made aware of his wife's infidelity after his wife went on a beach trip with person with whom she is conducting an extramarital affair.  He reports depressed mood ongoing for approx 2 months.  He reports sleeping "too much or too little."  He reports decreased focus x2 months.  He reports depressed mood and feelings of hopelessness and worthlessness.  He endorses passive suicidal ideation.  He denies plan or intent to harm self.  He reports intermittent, passive suicidal ideation "since my 8s, I wish I was dead."  He reports mood significantly worsened after separation from his wife.  Additional stressors include residing with his 8 year old aunt who keeps the house "too hot."  Patient reports temperature in his aunt's home also contributes to his difficulty with sleep.  Ultimate plan includes patient to purchase his own home once his wife sells the family home.  Patient reports financial concerns as he is applied for disability approximately 2 years ago but currently has no income.  [name] has been diagnosed major depressive disorder, dysthymia and generalized anxiety disorder.  He is followed by outpatient psychiatry, Dr. De Nurse.  He recently began outpatient individual counseling with Cordella Register however has only met with her twice and she is currently on vacation for approximately 1 month.  He is compliant  with medications including Wellbutrin and trazodone as needed.  Patient endorses 1 previous inpatient psychiatric hospitalization at age 5 when his parents were divorcing.  Patient denies homicidal ideations.  He denies auditory visual hallucinations.  There is no evidence of delusional thought content and no indication that patient is responding to internal stimuli.  He denies symptoms of paranoia.  Patient resides in [place] with his aunt. He denies access to weapons. He denies alcohol and substance use.  He endorses appetite.  Patient offered support and encouragement.  He agrees with plan for inpatient psychiatric hospitalization.  Diagnosis:  Final diagnoses:  Dysthymia (or depressive neurosis)    Total Time spent with patient: 30 minutes  Past Psychiatric History: MDD, GAD, dysthymia Past Medical History:  Past Medical History:  Diagnosis Date   Arthritis    "probably a touch in my back" (11/15/2014)   Chronic lower back pain    DDD (degenerative disc disease), lumbar    Difficulty coping    Diverticulitis 04/04/2015   MDD (major depressive disorder)    Neck pain    Seizure ([place])    had 1 seizure after back surgery from "too many muscle relaxers;.On no meds and no more seizure    Past Surgical History:  Procedure Laterality Date   ANTERIOR CERVICAL DECOMP/DISCECTOMY FUSION N/A 08/30/2014   Procedure: ANTERIOR CERVICAL DECOMPRESSION/DISCECTOMY FUSION CERVICAL FIVE-SIX,CERVICAL SIX-SEVEN;  Surgeon: Eustace Moore, MD;  Location: [facility];  Service: Neurosurgery;  Laterality: N/A;   COLONOSCOPY WITH PROPOFOL N/A 05/13/2015   Procedure: COLONOSCOPY WITH PROPOFOL;  Surgeon: Daneil Dolin, MD;  Location: [facility];  Service: Endoscopy;  Laterality: N/A;  58   POSTERIOR LUMBAR FUSION  11/15/2014   TONSILLECTOMY     Family History:  Family History  Problem Relation Age of Onset   Heart disease Mother    Seizures Mother    Migraines Mother    Heart attack Father     Colon cancer Neg Hx        Thinks his 37 may have been diagnosed but no first degree relatives   Family Psychiatric  History: mother- MDD, GAD Social History:  Social History   Substance and Sexual Activity  Alcohol Use No   Alcohol/week: 0.0 standard drinks of alcohol     Social History   Substance and Sexual Activity  Drug Use No    Social History   Socioeconomic History   Marital status: Married    Spouse name: Not on file   Number of children: 0   Years of education: GED   Highest education level: Not on file  Occupational History   Occupation: Unemployed  Tobacco Use   Smoking status: Every Day    Packs/day: 0.50    Years: 24.00    Total pack years: 12.00    Types: Cigarettes   Smokeless tobacco: Never   Tobacco comments:    11/15/2014 "down from 1 1/2 ppd in 08/2014"  Vaping Use   Vaping Use: Never used  Substance and Sexual Activity   Alcohol use: No    Alcohol/week: 0.0 standard drinks of alcohol   Drug use: No   Sexual activity: Yes    Birth control/protection: Other-see comments    Comment: Wife had tubaligation  Other Topics Concern   Not on file  Social History Narrative   Lives at home with his wife and step-children.   Right-handed.   3/4 - 1 two liter of soda per day.   Social Determinants of Health   Financial Resource Strain: Not on file  Food Insecurity: Not on file  Transportation Needs: Not on file  Physical Activity: Not on file  Stress: Not on file  Social Connections: Not on file   SDOH:  SDOH Screenings   Depression (PHQ2-9): Medium Risk (07/03/2020)  Tobacco Use: High Risk (01/06/2022)   Additional Social History:                         Sleep: Fair  Appetite:  Fair  Current Medications:  Current Facility-Administered Medications  Medication Dose Route Frequency Provider Last Rate Last Admin   acetaminophen (TYLENOL) tablet 650 mg  650 mg Oral Q6H PRN Onuoha, Chinwendu V, NP       alum & mag hydroxide-simeth  (MAALOX/MYLANTA) 200-200-20 MG/5ML suspension 30 mL  30 mL Oral Q4H PRN Onuoha, Chinwendu V, NP       buPROPion (WELLBUTRIN SR) 12 hr tablet 150 mg  150 mg Oral Daily Onuoha, Chinwendu V, NP   150 mg at 01/07/22 0908   colestipol (COLESTID) tablet 1 g  1 g Oral Once Onuoha, Chinwendu V, NP       hydrOXYzine (ATARAX) tablet 25 mg  25 mg Oral TID PRN Onuoha, Chinwendu V, NP   25 mg at 01/07/22 0026   magnesium hydroxide (MILK OF MAGNESIA) suspension 30 mL  30 mL Oral Daily PRN Onuoha, Chinwendu V, NP       nicotine (NICODERM CQ - dosed in mg/24 hours) patch 14 mg  14 mg Transdermal Daily Onuoha, Chinwendu V, NP   14 mg at  01/07/22 0909   traZODone (DESYREL) tablet 50 mg  50 mg Oral QHS PRN Onuoha, Chinwendu V, NP   50 mg at 01/07/22 0025   Current Outpatient Medications  Medication Sig Dispense Refill   buPROPion (WELLBUTRIN SR) 150 MG 12 hr tablet TAKE 1 TABLET BY MOUTH EVERY DAY 30 tablet 2   Cholecalciferol (VITAMIN D PO) Take 25 Units by mouth daily.     hydrOXYzine (ATARAX/VISTARIL) 10 MG tablet TAKE 1-2 TABLETS EVERY DAY BY MOUTH IF NEEDED 60 tablet 0   ibuprofen (ADVIL,MOTRIN) 200 MG tablet Take 200 mg by mouth daily as needed.     traZODone (DESYREL) 50 MG tablet TAKE 1 TABLET BY MOUTH EVERYDAY AT BEDTIME 90 tablet 1    Labs  Lab Results:  Admission on 01/06/2022  Component Date Value Ref Range Status   WBC 01/06/2022 16.5 (H)  4.0 - 10.5 K/uL Final   RBC 01/06/2022 5.39  4.22 - 5.81 MIL/uL Final   Hemoglobin 01/06/2022 15.7  13.0 - 17.0 g/dL Final   HCT 01/06/2022 47.2  39.0 - 52.0 % Final   MCV 01/06/2022 87.6  80.0 - 100.0 fL Final   MCH 01/06/2022 29.1  26.0 - 34.0 pg Final   MCHC 01/06/2022 33.3  30.0 - 36.0 g/dL Final   RDW 01/06/2022 14.1  11.5 - 15.5 % Final   Platelets 01/06/2022 397  150 - 400 K/uL Final   nRBC 01/06/2022 0.0  0.0 - 0.2 % Final   Neutrophils Relative % 01/06/2022 65  % Final   Neutro Abs 01/06/2022 10.6 (H)  1.7 - 7.7 K/uL Final   Lymphocytes Relative  01/06/2022 27  % Final   Lymphs Abs 01/06/2022 4.5 (H)  0.7 - 4.0 K/uL Final   Monocytes Relative 01/06/2022 7  % Final   Monocytes Absolute 01/06/2022 1.1 (H)  0.1 - 1.0 K/uL Final   Eosinophils Relative 01/06/2022 1  % Final   Eosinophils Absolute 01/06/2022 0.2  0.0 - 0.5 K/uL Final   Basophils Relative 01/06/2022 0  % Final   Basophils Absolute 01/06/2022 0.1  0.0 - 0.1 K/uL Final   Immature Granulocytes 01/06/2022 0  % Final   Abs Immature Granulocytes 01/06/2022 0.06  0.00 - 0.07 K/uL Final   Performed at [facility], [place] [address] [place], [place] [address]   Sodium 01/06/2022 138  135 - 145 mmol/L Final   Potassium 01/06/2022 4.1  3.5 - 5.1 mmol/L Final   Chloride 01/06/2022 105  98 - 111 mmol/L Final   CO2 01/06/2022 22  22 - 32 mmol/L Final   Glucose, Bld 01/06/2022 102 (H)  70 - 99 mg/dL Final   Glucose reference range applies only to samples taken after fasting for at least 8 hours.   BUN 01/06/2022 16  6 - 20 mg/dL Final   Creatinine, Ser 01/06/2022 1.23  0.61 - 1.24 mg/dL Final   Calcium 01/06/2022 9.5  8.9 - 10.3 mg/dL Final   Total Protein 01/06/2022 7.1  6.5 - 8.1 g/dL Final   Albumin 01/06/2022 3.9  3.5 - 5.0 g/dL Final   AST 01/06/2022 21  15 - 41 U/L Final   ALT 01/06/2022 31  0 - 44 U/L Final   Alkaline Phosphatase 01/06/2022 90  38 - 126 U/L Final   Total Bilirubin 01/06/2022 0.5  0.3 - 1.2 mg/dL Final   GFR, Estimated 01/06/2022 >60  >60 mL/min Final   Comment: (NOTE) Calculated using the CKD-EPI Creatinine Equation (2021)  Anion gap 01/06/2022 11  5 - 15 Final   Performed at [facility] [address], [place], [place] [address]   Hgb A1c MFr Bld 01/06/2022 5.4  4.8 - 5.6 % Final   Comment: (NOTE) Pre diabetes:          5.7%-6.4%  Diabetes:              >6.4%  Glycemic control for   <7.0% adults with diabetes    Mean Plasma Glucose 01/06/2022 108.28  mg/dL Final   Performed at [facility], [place] [address] [place], [place]  [address]   Cholesterol 01/06/2022 187  0 - 200 mg/dL Final   Triglycerides 01/06/2022 185 (H)  <150 mg/dL Final   HDL 01/06/2022 33 (L)  >40 mg/dL Final   Total CHOL/HDL Ratio 01/06/2022 5.7  RATIO Final   VLDL 01/06/2022 37  0 - 40 mg/dL Final   LDL Cholesterol 01/06/2022 117 (H)  0 - 99 mg/dL Final   Comment:        Total Cholesterol/HDL:CHD Risk Coronary Heart Disease Risk Table                     Men   Women  1/2 Average Risk   3.4   3.3  Average Risk       5.0   4.4  2 X Average Risk   9.6   7.1  3 X Average Risk  23.4   11.0        Use the calculated Patient Ratio above and the CHD Risk Table to determine the patient's CHD Risk.        ATP III CLASSIFICATION (LDL):  <100     mg/dL   Optimal  100-129  mg/dL   Near or Above                    Optimal  130-159  mg/dL   Borderline  160-189  mg/dL   High  >190     mg/dL   Very High Performed at [facility] [address] [place], [place] [address]    TSH 01/06/2022 4.219  0.350 - 4.500 uIU/mL Final   Comment: Performed by a 3rd Generation assay with a functional sensitivity of <=0.01 uIU/mL. Performed at [facility], [place] [address] [place], [place] [address]    POC Amphetamine UR 01/06/2022 None Detected  NONE DETECTED (Cut Off Level 1000 ng/mL) Final   POC Secobarbital (BAR) 01/06/2022 None Detected  NONE DETECTED (Cut Off Level 300 ng/mL) Final   POC Buprenorphine (BUP) 01/06/2022 None Detected  NONE DETECTED (Cut Off Level 10 ng/mL) Final   POC Oxazepam (BZO) 01/06/2022 None Detected  NONE DETECTED (Cut Off Level 300 ng/mL) Final   POC Cocaine UR 01/06/2022 None Detected  NONE DETECTED (Cut Off Level 300 ng/mL) Final   POC Methamphetamine UR 01/06/2022 None Detected  NONE DETECTED (Cut Off Level 1000 ng/mL) Final   POC Morphine 01/06/2022 None Detected  NONE DETECTED (Cut Off Level 300 ng/mL) Final   POC Methadone UR 01/06/2022 None Detected  NONE DETECTED (Cut Off Level 300 ng/mL) Final   POC Oxycodone UR  01/06/2022 None Detected  NONE DETECTED (Cut Off Level 100 ng/mL) Final   POC Marijuana UR 01/06/2022 Positive (A)  NONE DETECTED (Cut Off Level 50 ng/mL) Final  Hospital Outpatient Visit on 01/06/2022  Component Date Value Ref Range Status   SARS Coronavirus 2  by RT PCR 01/06/2022 NEGATIVE  NEGATIVE Final   Comment: (NOTE) SARS-CoV-2 target nucleic acids are NOT DETECTED.  The SARS-CoV-2 RNA is generally detectable in upper and lower respiratory specimens during the acute phase of infection. The lowest concentration of SARS-CoV-2 viral copies this assay can detect is 250 copies / mL. A negative result does not preclude SARS-CoV-2 infection and should not be used as the sole basis for treatment or other patient management decisions.  A negative result may occur with improper specimen collection / handling, submission of specimen other than nasopharyngeal swab, presence of viral mutation(s) within the areas targeted by this assay, and inadequate number of viral copies (<250 copies / mL). A negative result must be combined with clinical observations, patient history, and epidemiological information.  Fact Sheet for Patients:   https://www.patel.info/  Fact Sheet for Healthcare Providers: https://hall.com/  This test is not yet approved or                           cleared by the [place] FDA and has been authorized for detection and/or diagnosis of SARS-CoV-2 by FDA under an Emergency Use Authorization (EUA).  This EUA will remain in effect (meaning this test can be used) for the duration of the COVID-19 declaration under Section 564(b)(1) of the Act, 21 U.S.C. section 360bbb-3(b)(1), unless the authorization is terminated or revoked sooner.  Performed at [facility], [place] [address], [place], [place] [address]     Blood Alcohol level:  Lab Results  Component Value Date   ETH <5 03/50/0938    Metabolic  Disorder Labs: Lab Results  Component Value Date   HGBA1C 5.4 01/06/2022   MPG 108.28 01/06/2022   No results found for: "PROLACTIN" Lab Results  Component Value Date   CHOL 187 01/06/2022   TRIG 185 (H) 01/06/2022   HDL 33 (L) 01/06/2022   CHOLHDL 5.7 01/06/2022   VLDL 37 01/06/2022   LDLCALC 117 (H) 01/06/2022    Therapeutic Lab Levels: No results found for: "LITHIUM" No results found for: "VALPROATE" No results found for: "CBMZ"  Physical Findings   GAD-7    [facility] Office Visit from 05/12/2017 in [facility]  Total GAD-7 Score 14      PHQ2-9    Flowsheet Row Video Visit from 07/03/2020 in [facility] Office Visit from 05/12/2017 in [facility]  PHQ-2 Total Score 2 4  PHQ-9 Total Score 8 17      Flowsheet Row ED from 01/06/2022 in [facility] Most recent reading at 01/07/2022 12:41 AM OP Visit from 01/06/2022 in [facility] Most recent reading at 01/06/2022  9:12 PM Office Visit from 12/09/2021 in [facility] Most recent reading at 12/09/2021  3:28 PM  C-SSRS RISK CATEGORY No Risk Low Risk No Risk        Musculoskeletal  Strength & Muscle Tone: within normal limits Gait & Station: normal Patient leans: N/A  Psychiatric Specialty Exam  Presentation  General Appearance: Appropriate for Environment; Casual  Eye Contact:Good  Speech:Clear and Coherent; Normal Rate  Speech Volume:Normal  Handedness:Right   Mood and Affect  Mood:Depressed; Hopeless  Affect:Depressed   Thought Process  Thought Processes:Coherent; Goal Directed; Linear  Descriptions of Associations:Intact  Orientation:Full (Time, Place and Person)  Thought Content:Logical; WDL     Hallucinations:Hallucinations: None  Ideas  of Reference:None  Suicidal  Thoughts:Suicidal Thoughts: Yes, Passive SI Passive Intent and/or Plan: Without Intent; Without Plan  Homicidal Thoughts:Homicidal Thoughts: No   Sensorium  Memory:Immediate Good; Recent [place]  Insight:Fair   Executive Functions  Concentration:Good  Attention Span:Good  [place] of Knowledge:Good  Language:Good   Psychomotor Activity  Psychomotor Activity:Psychomotor Activity: Normal   Assets  Assets:Communication Skills; Desire for Improvement; Financial Resources/Insurance; Housing; Intimacy; Leisure Time; Physical Health; Resilience; Social Support   Sleep  Sleep:Sleep: Fair   Nutritional Assessment (For OBS and FBC admissions only) Has the patient had a weight loss or gain of 10 pounds or more in the last 3 months?: No Has the patient had a decrease in food intake/or appetite?: No Does the patient have dental problems?: No Does the patient have eating habits or behaviors that may be indicators of an eating disorder including binging or inducing vomiting?: No Has the patient recently lost weight without trying?: 0 Has the patient been eating poorly because of a decreased appetite?: 0 Malnutrition Screening Tool Score: 0    Physical Exam  Physical Exam Vitals and nursing note reviewed.  Constitutional:      General: He is not in acute distress.    Appearance: Normal appearance. He is well-developed.  HENT:     Head: Normocephalic and atraumatic.     Nose: Nose normal.  Cardiovascular:     Rate and Rhythm: Normal rate.     Heart sounds: No murmur heard. Pulmonary:     Effort: Pulmonary effort is normal. No respiratory distress.  Abdominal:     Tenderness: There is no abdominal tenderness.  Musculoskeletal:        General: No swelling. Normal range of motion.     Cervical back: Normal range of motion.  Skin:    General: Skin is warm and dry.  Neurological:     Mental Status: He is alert and oriented to person, place, and  time. Mental status is at baseline.  Psychiatric:        Attention and Perception: Attention and perception normal.        Mood and Affect: Affect normal. Mood is depressed.        Speech: Speech normal.        Behavior: Behavior normal. Behavior is cooperative.        Thought Content: Thought content includes suicidal ideation.        Cognition and Memory: Cognition and memory normal.    Review of Systems  Constitutional: Negative.   HENT: Negative.    Eyes: Negative.   Respiratory: Negative.    Cardiovascular: Negative.   Gastrointestinal: Negative.   Genitourinary: Negative.   Musculoskeletal: Negative.   Skin: Negative.   Neurological: Negative.   Psychiatric/Behavioral:  Positive for depression and suicidal ideas.    Blood pressure 110/84, pulse 90, temperature 97.9 F (36.6 C), temperature source Oral, resp. rate 18, SpO2 97 %. There is no height or weight on file to calculate BMI.  Treatment Plan Summary: Patient reviewed with Dr. Hampton Abbot.  He remains voluntary.  Inpatient psychiatric hospitalization recommended. Daily contact with patient to assess and evaluate symptoms and progress in treatment  Lucky Rathke, FNP 01/07/2022 10:39 AM

## 2022-01-07 NOTE — ED Notes (Signed)
Pt was given cereal and milk for breakfast.  

## 2022-01-07 NOTE — ED Notes (Signed)
Patient A&Ox4. Patient denies SI/HI and AVH. However patient reports feeling depressed. Patient denies any physical complaints when asked. No acute distress noted. Support and encouragement provided. Routine safety checks conducted according to facility protocol. Encouraged patient to notify staff if thoughts of harm toward self or others arise. Patient verbalize understanding and agreement. Will continue to monitor for safety.

## 2022-01-07 NOTE — Progress Notes (Signed)
Pt is a 46 y.o. male who came to [facility] after having been at [facility] for SI, depression and anxiety.  Pt disclosed that his wife has been cheating on him for the past year, he is struggling to get his disability reinstated, and says he needs "better coping skills."  Pt is hopeful that he and his wife will reconcile "after a year."  Pt also struggles with chronic back pain.  Pt says he needs "to develop coping skills that work for me."  Pt is pleasant and cooperative during assessment.  Pt oriented to unit, room and staff.  Pt signed admission paperwork.  Pt in agreement with plan of care.

## 2022-01-07 NOTE — ED Notes (Signed)
Skin assessment performed and patient brought onto the continuing assessment unit.  Patient oriented to unit and surroundings.  Patient provided meal and fluids per request.

## 2022-01-07 NOTE — Progress Notes (Signed)
Psychoeducational Group Note  Date:  01/07/2022 Time:  2117  Group Topic/Focus:  Relapse Prevention Planning:   The focus of this group is to define relapse and discuss the need for planning to combat relapse.  Participation Level: Did Not Attend  Participation Quality:  Not Applicable  Affect:  Not Applicable  Cognitive:  Not Applicable  Insight:  Not Applicable  Engagement in Group: Not Applicable  Additional Comments:  The patient did not attend the evening N.A. group.   Hazle Coca S 01/07/2022, 9:17 PM

## 2022-01-07 NOTE — Progress Notes (Signed)
After arrival to the unit, patient remembered that he takes a medication called Colestid every night.  Provider notified and order entered.  Medication not available at [facility] and pharmacy notified.

## 2022-01-07 NOTE — Progress Notes (Addendum)
Pt was accepted to [facility] TODAY 01/07/22; Bed Assignment 301-1  Pt meets inpatient criteria per Doran Heater, FNP  Attending Physician will be Dr. Alto Denver;  Report can be called to: - Child and Adolescence unit: [phone] -Adult unit: [phone]  Pt can arrive after : [facility] will coordinate   Please fax voluntary consent to [phone].   Care Team notified: Isaiah Serge, LPN, Doran Heater, FNP, Tyrell Antonio, RN  Kelton Pillar, LCSWA 01/07/2022 @ 11:54 AM

## 2022-01-07 NOTE — Progress Notes (Signed)
Patient is currently in his recliner, breathing even and unlabored, appears to be sleeping.  Patient has voiced no concerns, no complaints thus far this shift.

## 2022-01-07 NOTE — ED Notes (Signed)
Patient resting quietly in bed with eyes closed. Respirations equal and unlabored, skin warm and dry, NAD. No change in assessment or acuity. Routine safety checks conducted according to facility protocol. Will continue to monitor for safety.   

## 2022-01-07 NOTE — ED Notes (Signed)
Patient sitting on bed eating meal. Respirations equal and unlabored, skin warm and dry, NAD. No change in assessment or acuity. Routine safety checks conducted according to facility protocol. Will continue to monitor for safety.

## 2022-01-07 NOTE — Tx Team (Signed)
Initial Treatment Plan 01/07/2022 7:01 PM Eduardo Barnes [id number]    PATIENT STRESSORS: Financial difficulties   Health problems   Marital or family conflict   Occupational concerns     PATIENT STRENGTHS: Ability for insight  Average or above average intelligence  Communication skills    PATIENT IDENTIFIED PROBLEMS: Depression  SI  Marital conflict                 DISCHARGE CRITERIA:  Improved stabilization in mood, thinking, and/or behavior Motivation to continue treatment in a less acute level of care Verbal commitment to aftercare and medication compliance  PRELIMINARY DISCHARGE PLAN: Outpatient therapy  PATIENT/FAMILY INVOLVEMENT: This treatment plan has been presented to and reviewed with the patient, Eduardo Barnes.  The patient has been given the opportunity to ask questions and make suggestions.  Garnette Scheuermann, RN 01/07/2022, 7:01 PM

## 2022-01-08 DIAGNOSIS — F332 Major depressive disorder, recurrent severe without psychotic features: Secondary | ICD-10-CM | POA: Diagnosis not present

## 2022-01-08 MED ORDER — PROPRANOLOL HCL 10 MG PO TABS
10.0000 mg | ORAL_TABLET | Freq: Two times a day (BID) | ORAL | Status: DC
  Administered 2022-01-08 – 2022-01-09 (×2): 10 mg via ORAL
  Filled 2022-01-08 (×4): qty 1

## 2022-01-08 MED ORDER — PROPRANOLOL HCL 10 MG PO TABS: 10.0000 mg | ORAL_TABLET | Freq: Two times a day (BID) | ORAL | Status: DC

## 2022-01-08 MED ORDER — TRAZODONE HCL 50 MG PO TABS
50.0000 mg | ORAL_TABLET | Freq: Every evening | ORAL | Status: DC | PRN
  Administered 2022-01-08: 50 mg via ORAL
  Filled 2022-01-08: qty 1

## 2022-01-08 MED ORDER — PRAZOSIN HCL 1 MG PO CAPS: 1.0000 mg | ORAL_CAPSULE | Freq: Once | ORAL | Status: DC

## 2022-01-08 MED ORDER — VENLAFAXINE HCL ER 75 MG PO CP24
75.0000 mg | ORAL_CAPSULE | Freq: Every day | ORAL | Status: DC
Start: 2022-01-09 — End: 2022-01-09
  Filled 2022-01-08: qty 1

## 2022-01-08 MED ORDER — VENLAFAXINE HCL ER 37.5 MG PO CP24
37.5000 mg | ORAL_CAPSULE | Freq: Every day | ORAL | Status: AC
Start: 2022-01-08 — End: 2022-01-08
  Administered 2022-01-08: 37.5 mg via ORAL
  Filled 2022-01-08: qty 1

## 2022-01-08 NOTE — Progress Notes (Signed)
Patient stated his stomach is better, that he does not need any medication for stomach pain or discomfort at this time.
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
## 2022-01-08 NOTE — BHH Counselor (Signed)
Eduardo Barnes declined SPE consent on 01/08/22 at 11:10am

## 2022-01-08 NOTE — BHH Counselor (Signed)
Adult Comprehensive Assessment  Patient ID: Eduardo Barnes, male   DOB: 01-Apr-1976, 46 y.o.   MRN: [id number]  Information Source: Information source: Patient  Current Stressors:  Patient states their primary concerns and needs for treatment are:: "my marriage has fallen apart.  I have been depressed for years." Patient states their goals for this hospitilization and ongoing recovery are:: "to feel better" Educational / Learning stressors: none Employment / Job issues: "I am not working. 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, mining Physical health (include injuries & life threatening diseases): degenerative disc disease Social relationships: "all of my friends use drugs" Substance abuse: "sober for 8 years & 9 months" Bereavement / Loss: "My dad died in 2001-08-30 and my step sister overdosed in 2003/08/31"  Living/Environment/Situation:  Living Arrangements: Other relatives (lives iwth 46 yr old Engineer, mining) Who else lives in the home?: Aunt How long has patient lived in current situation?: since July 2023 What is atmosphere in current home: Comfortable  Family History:  Marital status: Separated Separated, when?: November 26, 2021 What types of issues is patient dealing with in the relationship?: "I found out she was cheating on me for the past year" Are you sexually active?: Yes What is your sexual orientation?: heterosexual Has your sexual activity been affected by drugs, alcohol, medication, or emotional stress?: no Does patient have children?: No  Childhood History:  By whom was/is the patient raised?: Both parents Additional childhood history information: parents seperated age 1. back and forth with both parents Description of patient's relationship with caregiver when they were a child: good Patient's description of current relationship with people who raised  him/her: good How were you disciplined when you got in trouble as a child/adolescent?: "not really" Does patient have siblings?: Yes Number of Siblings: 2 Description of patient's current relationship with siblings: 1 sister-good relationship; stepsister-OD in 08-31-03 Did patient suffer any verbal/emotional/physical/sexual abuse as a child?: No Did patient suffer from severe childhood neglect?: No Has patient ever been sexually abused/assaulted/raped as an adolescent or adult?: No Was the patient ever a victim of a crime or a disaster?: No Witnessed domestic violence?: No Has patient been affected by domestic violence as an adult?: No  Education:  Highest grade of school patient has completed: GED Currently a Consulting civil engineer?: No Learning disability?: No  Employment/Work Situation:   Employment Situation: Unemployed Patient's Job has Been Impacted by Current Illness: No Has Patient ever Been in the [employer]?: No  Financial Resources:   Surveyor, quantity resources: Sales executive, No income Does patient have a Lawyer or guardian?: No  Alcohol/Substance Abuse:   What has been your use of drugs/alcohol within the last 12 months?: no sober for 8 years If attempted suicide, did drugs/alcohol play a role in this?: No  Social Support System:   Conservation officer, nature Support System: Production assistant, radio System: Therapy & Psychiatrist Type of faith/religion: "I don't go to church but I do believe" How does patient's faith help to cope with current illness?: prayer  Leisure/Recreation:   Do You Have Hobbies?: No  Strengths/Needs:   What is the patient's perception of their strengths?: "mechanically minded" Patient states they can use these personal strengths during their treatment to contribute to their recovery: denies Patient states these barriers may affect/interfere with their treatment: income Patient states these barriers may affect their return to the community:  no  Discharge  Plan:   Currently receiving community mental health services: Yes (From Whom) ([facility], [facility]) Patient states concerns and preferences for aftercare planning are: none Patient states they will know when they are safe and ready for discharge when: "feel better" Does patient have access to transportation?: Yes Does patient have financial barriers related to discharge medications?: No Will patient be returning to same living situation after discharge?: Yes  Summary/Recommendations:   Summary and Recommendations (to be completed by the evaluator): Eduardo Barnes is a 46 year old male that was admitted into [facility] on 01/07/22 for suicidal ideations. He is currently unemployed.  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  He has a felony of his record.  He reports that he found out that his wife was cheating for the past year.  He is not seperated from her since November 26, 2021. He currently lives with his aunt. [facility] in [place] where he is connected to therapy and psychiatry. While here, Garnie can benefit from crisis stabilization, medication management, therapeutic milieu, and referrals for services.   Marinda Elk. 01/08/2022

## 2022-01-08 NOTE — BHH Suicide Risk Assessment (Signed)
BHH INPATIENT:  Family/Significant Other Suicide Prevention Education  Suicide Prevention Education:  Patient Refusal for Family/Significant Other Suicide Prevention Education: The patient Eduardo Barnes has refused to provide written consent for family/significant other to be provided Family/Significant Other Suicide Prevention Education during admission and/or prior to discharge.  Physician notified.  Metro Kung Kiannah Grunow 01/08/2022, 1:42 PM

## 2022-01-08 NOTE — Plan of Care (Signed)
Nurse discussed anxiety, depression and coping skills with patient.  

## 2022-01-08 NOTE — Progress Notes (Signed)
D:  Patient refused prn medications this morning. Patient's self inventory sheet, patient sleeps good, no sleep medication.  Good appetite, low energy level, poor concentration.  Rated depression 6, hopeless 4, anxiety 3.  Denied withdrawals.  Denied SI.  Denied physical problems.  Physical pain, back pain #5, does not take prn medications.  Goal is would like to work on Pharmacologist.  Plans to attend groups and participate.  No discharge plans. A:  Medications administered per MD orders.  Patient refuses prn medications.  Emotional support and encouragement given patient. R:   Denied SI and HI, contracts for safety.  Denied A/V hallucinations.  Safety maintained with 15 minute checks.

## 2022-01-08 NOTE — [facility] Suicide Risk Assessment (Signed)
[facility] Admission Suicide Risk Assessment   Nursing information obtained from:  Patient Demographic factors:  Male, Low socioeconomic status, Unemployed Current Mental Status:  NA Loss Factors:  Loss of significant relationship, Decline in physical health, Financial problems / change in socioeconomic status Historical Factors:  Impulsivity Risk Reduction Factors:  NA  Total Time spent with patient: 45 minutes Principal Problem: MDD (major depressive disorder), recurrent severe, without psychosis (HCC) Diagnosis:  Principal Problem:   MDD (major depressive disorder), recurrent severe, without psychosis (HCC)   Subjective Data:  Eduardo Barnes is a 46 y.o. male with hx of MDD, GAD with panic attacks, and degenerative disk disease previously on  disability who presented to [facility] voluntarily with passive SI and worsening depression.   Patient reports recent stressors including separation from wife of 14 years on 11/26/2021 after finding out about wife's infidelity that has been going on for 1 year.  Following this, patient also has been struggling with financial stressors given he is currently not working and does not have disability and living with 67 year old aunt that keeps the house very warm.   Reports acute worsening of depressive symptoms for past 2 months.  Patient endorsing having depressive symptoms from as early as when he was 44.  Patient is pan positive for depressive symptoms including depressed mood, anhedonia, hopelessness, worthlessness, poor energy, poor concentration, hypersomnia, poor appetite.  Patient endorses having passive SI more recently thinking that he would be "better off dead" but denies any plan or intent stating that he feels he would never do this.  Patient is able to contract for safety while on the unit.  Patient does endorse rumination that sometimes makes falling asleep difficult.    Patient endorses feeling very anxious in general but also especially when in  social situations where there are too many people.  Patient reports feeling that he when he is in this environment, he "feels a strong need to go home".   Patient denies ever having a manic episode/hypomania.  Patient denies any psychotic symptoms including AVH, paranoia, ideas of reference.   Patient denies history of trauma.    Continued Clinical Symptoms:  Alcohol Use Disorder Identification Test Final Score (AUDIT): 0 The "Alcohol Use Disorders Identification Test", Guidelines for Use in Primary Care, Second Edition.  World Science writer [facility]). Score between 0-7:  no or low risk or alcohol related problems. Score between 8-15:  moderate risk of alcohol related problems. Score between 16-19:  high risk of alcohol related problems. Score 20 or above:  warrants further diagnostic evaluation for alcohol dependence and treatment.   CLINICAL FACTORS:   Severe Anxiety and/or Agitation Dysthymia   Musculoskeletal: Strength & Muscle Tone: within normal limits Gait & Station: normal Patient leans: N/A  Psychiatric Specialty Exam:  Presentation  General Appearance: Appropriate for Environment; Casual   Eye Contact:Good   Speech:Clear and Coherent; Normal Rate   Speech Volume:Normal   Handedness:Right   Mood and Affect  Mood:Depressed; Hopeless   Affect:Depressed    Thought Process  Thought Processes:Coherent; Goal Directed; Linear   Descriptions of Associations:Intact   Orientation:Full (Time, Place and Person)   Thought Content:Logical; WDL   History of Schizophrenia/Schizoaffective disorder:No data recorded  Duration of Psychotic Symptoms:No data recorded  Hallucinations:Hallucinations: None   Ideas of Reference:None   Suicidal Thoughts:Suicidal Thoughts: Yes, Passive SI Passive Intent and/or Plan: Without Intent; Without Plan   Homicidal Thoughts:Homicidal Thoughts: No    Sensorium  Memory:Immediate Good; Recent  Fair   Judgment:Fair  Insight:Fair    Executive Functions  Concentration:Good   Attention Span:Good   Recall:Good   Fund of Knowledge:Good   Language:Good    Psychomotor Activity  Psychomotor Activity:Psychomotor Activity: Normal    Assets  Assets:Communication Skills; Desire for Improvement; Financial Resources/Insurance; Housing; Intimacy; Leisure Time; Physical Health; Resilience; Social Support    Sleep  Sleep:Sleep: Fair     Physical Exam: Physical Exam Review of Systems  Respiratory:  Negative for shortness of breath.   Cardiovascular:  Negative for chest pain.  Gastrointestinal:  Positive for diarrhea. Negative for abdominal pain, constipation, heartburn, nausea and vomiting.  Neurological:  Negative for headaches.   Blood pressure 120/85, pulse 88, temperature (!) 97.3 F (36.3 C), temperature source Oral, resp. rate 16, height 5\' 10"  (1.778 m), weight 112.5 kg, SpO2 98 %. Body mass index is 35.58 kg/m.   COGNITIVE FEATURES THAT CONTRIBUTE TO RISK:  None    SUICIDE RISK:   Moderate:  Frequent suicidal ideation with limited intensity, and duration, some specificity in terms of plans, no associated intent, good self-control, limited dysphoria/symptomatology, some risk factors present, and identifiable protective factors, including available and accessible social support.  PLAN OF CARE: see H&P  I certify that inpatient services furnished can reasonably be expected to improve the patient's condition.   , MD 01/08/2022, 11:04 AM

## 2022-01-09 ENCOUNTER — Encounter (HOSPITAL_COMMUNITY): Payer: Self-pay

## 2022-01-09 DIAGNOSIS — F332 Major depressive disorder, recurrent severe without psychotic features: Secondary | ICD-10-CM | POA: Diagnosis not present

## 2022-01-09 LAB — CBC
HCT: 46.4 % (ref 39.0–52.0)
Hemoglobin: 15.4 g/dL (ref 13.0–17.0)
MCH: 29.3 pg (ref 26.0–34.0)
MCHC: 33.2 g/dL (ref 30.0–36.0)
MCV: 88.2 fL (ref 80.0–100.0)
Platelets: 364 10*3/uL (ref 150–400)
RBC: 5.26 MIL/uL (ref 4.22–5.81)
RDW: 14.1 % (ref 11.5–15.5)
WBC: 12.6 10*3/uL — ABNORMAL HIGH (ref 4.0–10.5)
nRBC: 0 % (ref 0.0–0.2)

## 2022-01-09 MED ORDER — PROPRANOLOL HCL 10 MG PO TABS
10.0000 mg | ORAL_TABLET | Freq: Two times a day (BID) | ORAL | Status: DC
  Administered 2022-01-09 – 2022-01-12 (×6): 10 mg via ORAL
  Filled 2022-01-09 (×11): qty 1

## 2022-01-09 MED ORDER — PROPRANOLOL HCL 20 MG PO TABS: 20.0000 mg | ORAL_TABLET | Freq: Two times a day (BID) | ORAL | Status: DC

## 2022-01-09 MED ORDER — VENLAFAXINE HCL ER 75 MG PO CP24
75.0000 mg | ORAL_CAPSULE | Freq: Every day | ORAL | Status: DC
  Administered 2022-01-09 – 2022-01-10 (×2): 75 mg via ORAL
  Filled 2022-01-09 (×4): qty 1

## 2022-01-09 MED ORDER — HYDROXYZINE HCL 50 MG PO TABS
50.0000 mg | ORAL_TABLET | Freq: Every day | ORAL | Status: DC
  Administered 2022-01-09 – 2022-01-11 (×3): 50 mg via ORAL
  Filled 2022-01-09 (×5): qty 1

## 2022-01-09 NOTE — Group Note (Signed)
LCSW Group Therapy Note   Group Date: 01/09/2022 Start Time: 1300 End Time: 1400   Type of Therapy and Topic:  Group Therapy: Challenging Core Beliefs  Participation Level:  Active  Description of Group:  Patients were educated about core beliefs and asked to identify one harmful core belief that they have. Patients were asked to explore from where those beliefs originate. Patients were asked to discuss how those beliefs make them feel and the resulting behaviors of those beliefs. They were then be asked if those beliefs are true and, if so, what evidence they have to support them. Lastly, group members were challenged to replace those negative core beliefs with helpful beliefs.   Therapeutic Goals:   1. Patient will identify harmful core beliefs and explore the origins of such beliefs. 2. Patient will identify feelings and behaviors that result from those core beliefs. 3. Patient will discuss whether such beliefs are true. 4.  Patient will replace harmful core beliefs with helpful ones.  Summary of Patient Progress:  Eduardo Barnes actively engaged in processing and exploring how core beliefs are formed and how they impact thoughts, feelings, and behaviors. Patient proved open to input from peers and feedback from CSW. Patient demonstrated Good insight into the subject matter, was respectful and supportive of peers, and participated throughout the entire session.  Therapeutic Modalities: Cognitive Behavioral Therapy; Solution-Focused Therapy   Ane Payment, LCSW 01/09/2022  2:11 PM

## 2022-01-09 NOTE — BH IP Treatment Plan (Signed)
Interdisciplinary Treatment and Diagnostic Plan Update  01/09/2022 Time of Session: 1000 Eduardo Barnes MRN: [id number]  Principal Diagnosis: MDD (major depressive disorder), recurrent severe, without psychosis (HCC)  Secondary Diagnoses: Principal Problem:   MDD (major depressive disorder), recurrent severe, without psychosis (HCC)   Current Medications:  Current Facility-Administered Medications  Medication Dose Route Frequency Provider Last Rate Last Admin   acetaminophen (TYLENOL) tablet 650 mg  650 mg Oral Q6H PRN Lenard Lance, FNP       alum & mag hydroxide-simeth (MAALOX/MYLANTA) 200-200-20 MG/5ML suspension 30 mL  30 mL Oral Q4H PRN Lenard Lance, FNP       colestipol (COLESTID) tablet 1 g  1 g Oral QPC supper Cecilie Lowers, FNP   1 g at 01/08/22 1724   famotidine (PEPCID) tablet 20 mg  20 mg Oral Daily Lenard Lance, FNP   20 mg at 01/09/22 0758   feeding supplement (ENSURE ENLIVE / ENSURE PLUS) liquid 237 mL  237 mL Oral BID BM Massengill, Harrold Donath, MD       hydrOXYzine (ATARAX) tablet 25 mg  25 mg Oral TID PRN Lenard Lance, FNP       hydrOXYzine (ATARAX) tablet 50 mg  50 mg Oral QHS Park Pope, MD       magnesium hydroxide (MILK OF MAGNESIA) suspension 30 mL  30 mL Oral Daily PRN Lenard Lance, FNP       nicotine (NICODERM CQ - dosed in mg/24 hours) patch 14 mg  14 mg Transdermal Daily Lenard Lance, FNP   14 mg at 01/09/22 0759   propranolol (INDERAL) tablet 10 mg  10 mg Oral BID Park Pope, MD       venlafaxine XR (EFFEXOR-XR) 24 hr capsule 75 mg  75 mg Oral Q breakfast Park Pope, MD   75 mg at 01/09/22 2376   PTA Medications: Medications Prior to Admission  Medication Sig Dispense Refill Last Dose   buPROPion (WELLBUTRIN SR) 150 MG 12 hr tablet TAKE 1 TABLET BY MOUTH EVERY DAY (Patient taking differently: 150 mg daily.) 30 tablet 2    clindamycin (CLEOCIN T) 1 % external solution Apply 1 Application topically 2 (two) times daily as needed (Apply to affected area).       colestipol (COLESTID) 1 g tablet Take 1 g by mouth daily after supper.      famotidine (PEPCID) 40 MG tablet Take 40 mg by mouth daily as needed for heartburn or indigestion.      traZODone (DESYREL) 50 MG tablet TAKE 1 TABLET BY MOUTH EVERYDAY AT BEDTIME (Patient taking differently: Take 50 mg by mouth at bedtime as needed for sleep.) 90 tablet 1     Patient Stressors: Financial difficulties   Health problems   Marital or family conflict   Occupational concerns    Patient Strengths: Ability for insight  Average or above average intelligence  Communication skills   Treatment Modalities: Medication Management, Group therapy, Case management,  1 to 1 session with clinician, Psychoeducation, Recreational therapy.   Physician Treatment Plan for Primary Diagnosis: MDD (major depressive disorder), recurrent severe, without psychosis (HCC) Long Term Goal(s):     Short Term Goals:    Medication Management: Evaluate patient's response, side effects, and tolerance of medication regimen.  Therapeutic Interventions: 1 to 1 sessions, Unit Group sessions and Medication administration.  Evaluation of Outcomes: Progressing  Physician Treatment Plan for Secondary Diagnosis: Principal Problem:   MDD (major depressive disorder), recurrent severe, without psychosis (HCC)  Long Term Goal(s):     Short Term Goals:       Medication Management: Evaluate patient's response, side effects, and tolerance of medication regimen.  Therapeutic Interventions: 1 to 1 sessions, Unit Group sessions and Medication administration.  Evaluation of Outcomes: Progressing   RN Treatment Plan for Primary Diagnosis: MDD (major depressive disorder), recurrent severe, without psychosis (HCC) Long Term Goal(s): Knowledge of disease and therapeutic regimen to maintain health will improve  Short Term Goals: Ability to remain free from injury will improve, Ability to verbalize frustration and anger appropriately will  improve, Ability to demonstrate self-control, Ability to participate in decision making will improve, Ability to verbalize feelings will improve, Ability to disclose and discuss suicidal ideas, Ability to identify and develop effective coping behaviors will improve, and Compliance with prescribed medications will improve  Medication Management: RN will administer medications as ordered by provider, will assess and evaluate patient's response and provide education to patient for prescribed medication. RN will report any adverse and/or side effects to prescribing provider.  Therapeutic Interventions: 1 on 1 counseling sessions, Psychoeducation, Medication administration, Evaluate responses to treatment, Monitor vital signs and CBGs as ordered, Perform/monitor CIWA, COWS, AIMS and Fall Risk screenings as ordered, Perform wound care treatments as ordered.  Evaluation of Outcomes: Progressing   LCSW Treatment Plan for Primary Diagnosis: MDD (major depressive disorder), recurrent severe, without psychosis (HCC) Long Term Goal(s): Safe transition to appropriate next level of care at discharge, Engage patient in therapeutic group addressing interpersonal concerns.  Short Term Goals: Engage patient in aftercare planning with referrals and resources, Increase social support, Increase ability to appropriately verbalize feelings, Increase emotional regulation, Facilitate acceptance of mental health diagnosis and concerns, Facilitate patient progression through stages of change regarding substance use diagnoses and concerns, Identify triggers associated with mental health/substance abuse issues, and Increase skills for wellness and recovery  Therapeutic Interventions: Assess for all discharge needs, 1 to 1 time with Social worker, Explore available resources and support systems, Assess for adequacy in community support network, Educate family and significant other(s) on suicide prevention, Complete Psychosocial  Assessment, Interpersonal group therapy.  Evaluation of Outcomes: Progressing   Progress in Treatment: Attending groups: Yes. Participating in groups: Yes. Taking medication as prescribed: Yes. Toleration medication: Yes. Family/Significant other contact made: No, will contact:  Patient has declined consent for CSW to reach family/friend.  Patient understands diagnosis: Yes. Discussing patient identified problems/goals with staff: Yes. Medical problems stabilized or resolved: Yes. Denies suicidal/homicidal ideation: No. Issues/concerns per patient self-inventory: Yes. Other: none  New problem(s) identified: No, Describe:  none  New Short Term/Long Term Goal(s): Patient to work towards medication management for mood stabilization; elimination of SI thoughts; development of comprehensive mental wellness plan.  Patient Goals: Patient states their goal for treatment is to "slow down my process of thinking and stop and take a breath. ."  Discharge Plan or Barriers: No psychosocial barriers identified at this time, patient to return to place of residence when appropriate for discharge.   Reason for Continuation of Hospitalization: Depression  Estimated Length of Stay: 1-7 days    Last 3 [place] Suicide Severity Risk Score: Flowsheet Row [facility] (Current[facility] Most recent reading at 01/07/2022  5:00 PM ED from 01/06/2022 in [facility] Most recent reading at 01/07/2022 12:41 AM OP [facility] Most recent reading at 01/06/2022  9:12 PM  C-SSRS RISK CATEGORY No Risk No  Risk Low Risk       Last PHQ 2/9 Scores:    07/03/2020   10:34 AM 05/12/2017    8:56 AM  Depression screen PHQ 2/9  Decreased Interest 1 2  Down, Depressed, Hopeless 1 2  PHQ - 2 Score 2 4  Altered sleeping 1 1  Tired, decreased energy 1 3  Change in appetite 1 2  Feeling bad  or failure about yourself  1 3  Trouble concentrating 1 3  Moving slowly or fidgety/restless 1 0  Suicidal thoughts 0 1  PHQ-9 Score 8 17    Scribe for Treatment Team: Almedia Balls 01/09/2022 2:15 PM

## 2022-01-09 NOTE — Progress Notes (Signed)
[facility] MD Progress Note  01/09/2022 9:56 AM Eduardo Barnes  MRN:  [id number] Principal Problem: MDD (major depressive disorder), recurrent severe, without psychosis (HCC) Diagnosis: Principal Problem:   MDD (major depressive disorder), recurrent severe, without psychosis (HCC)   Reason for Admission: Eduardo Barnes is a 46 y.o. male with hx of MDD, GAD with panic attacks, and degenerative disk disease previously on  disability who presented to [facility] voluntarily with passive SI and worsening depression. This is hospitalization day 2.  Subjective: Patient seen and evaluated in dayroom.  Patient denies SI/HI/AVH.  Patient reports feeling somnolent this morning because he had did not have any sleep last night despite trazodone.  Patient reports that he uses trazodone him sleep well.  Objective:   Total Time spent with patient: 45 minutes  Past Psychiatric History:  Previous Psych Diagnoses: MDD, GAD, panic disorder Prior inpatient treatment: 1 month when 12 after separation of mom and dad Current/prior outpatient treatment:wellbutrin SR 150 mg and trazodone 50 mg qhs Psychotherapy EN:IDPOEUMP History of suicide:denies History of homicide:denies Psychiatric medication history:paxil (profuse sweating) Psychiatric medication compliance history:good Neuromodulation history: denies Current Psychiatrist:Dr. Gilmore Laroche Current therapist: Coolidge Breeze  Past Medical History:  Past Medical History:  Diagnosis Date   Arthritis    "probably a touch in my back" (11/15/2014)   Chronic lower back pain    DDD (degenerative disc disease), lumbar    Difficulty coping    Diverticulitis 04/04/2015   MDD (major depressive disorder)    Neck pain    Seizure (HCC)    had 1 seizure after back surgery from "too many muscle relaxers;.On no meds and no more seizure    Past Surgical History:  Procedure Laterality Date   ANTERIOR CERVICAL DECOMP/DISCECTOMY FUSION N/A 08/30/2014   Procedure: ANTERIOR CERVICAL  DECOMPRESSION/DISCECTOMY FUSION CERVICAL FIVE-SIX,CERVICAL SIX-SEVEN;  Surgeon: Tia Alert, MD;  Location: [facility]UR[facility]S;  Service: Neurosurgery;  Laterality: N/A;   COLONOSCOPY WITH PROPOFOL N/A 05/13/2015   Procedure: COLONOSCOPY WITH PROPOFOL;  Surgeon: Corbin Ade, MD;  Location: [facility] ENDO SUITE;  Service: Endoscopy;  Laterality: N/A;  1030   POSTERIOR LUMBAR FUSION  11/15/2014   TONSILLECTOMY     Family History:  Family History  Problem Relation Age of Onset   Heart disease Mother    Seizures Mother    Migraines Mother    Heart attack Father    Colon cancer Neg Hx        Thinks his Aunt may have been diagnosed but no first degree relatives   Family Psychiatric  History:  Psych: mom with depression and anxiety, sister with panic attacks SA/HA: denies Substance use family hx: sister alcohol  Social History:  Social History   Substance and Sexual Activity  Alcohol Use No   Alcohol/week: 0.0 standard drinks of alcohol     Social History   Substance and Sexual Activity  Drug Use No    Social History   Socioeconomic History   Marital status: Married    Spouse name: Not on file   Number of children: 0   Years of education: GED   Highest education level: Not on file  Occupational History   Occupation: Unemployed  Tobacco Use   Smoking status: Every Day    Packs/day: 0.50    Years: 24.00    Total pack years: 12.00    Types: Cigarettes   Smokeless tobacco: Never   Tobacco comments:    11/15/2014 "down from 1 1/2 ppd in 08/2014"  Vaping Use   Vaping Use: Never used  Substance and Sexual Activity   Alcohol use: No    Alcohol/week: 0.0 standard drinks of alcohol   Drug use: No   Sexual activity: Yes    Birth control/protection: Other-see comments    Comment: Wife had tubaligation  Other Topics Concern   Not on file  Social History Narrative   Lives at home with his wife and step-children.   Right-handed.   3/4 - 1 two liter of soda per day.   Social  Determinants of Health   Financial Resource Strain: Not on file  Food Insecurity: No Food Insecurity (01/07/2022)   Hunger Vital Sign    Worried About Running Out of Food in the Last Year: Never true    Ran Out of Food in the Last Year: Never true  Transportation Needs: Not on file  Physical Activity: Not on file  Stress: Not on file  Social Connections: Not on file   Additional Social History:                         Current Medications: Current Facility-Administered Medications  Medication Dose Route Frequency Provider Last Rate Last Admin   acetaminophen (TYLENOL) tablet 650 mg  650 mg Oral Q6H PRN Lenard Lance, FNP       alum & mag hydroxide-simeth (MAALOX/MYLANTA) 200-200-20 MG/5ML suspension 30 mL  30 mL Oral Q4H PRN Lenard Lance, FNP       colestipol (COLESTID) tablet 1 g  1 g Oral QPC supper Cecilie Lowers, FNP   1 g at 01/08/22 1724   famotidine (PEPCID) tablet 20 mg  20 mg Oral Daily Lenard Lance, FNP   20 mg at 01/09/22 0758   feeding supplement (ENSURE ENLIVE / ENSURE PLUS) liquid 237 mL  237 mL Oral BID BM Massengill, Harrold Donath, MD       hydrOXYzine (ATARAX) tablet 25 mg  25 mg Oral TID PRN Lenard Lance, FNP       hydrOXYzine (ATARAX) tablet 50 mg  50 mg Oral QHS Park Pope, MD       magnesium hydroxide (MILK OF MAGNESIA) suspension 30 mL  30 mL Oral Daily PRN Lenard Lance, FNP       nicotine (NICODERM CQ - dosed in mg/24 hours) patch 14 mg  14 mg Transdermal Daily Lenard Lance, FNP   14 mg at 01/09/22 0759   propranolol (INDERAL) tablet 10 mg  10 mg Oral BID Park Pope, MD       venlafaxine XR (EFFEXOR-XR) 24 hr capsule 75 mg  75 mg Oral Q breakfast Park Pope, MD   75 mg at 01/09/22 7989    Lab Results:  Results for orders placed or performed during the hospital encounter of 01/07/22 (from the past 24 hour(s))  CBC     Status: Abnormal   Collection Time: 01/09/22  6:33 AM  Result Value Ref Range   WBC 12.6 (H) 4.0 - 10.5 K/uL   RBC 5.26 4.22 - 5.81 MIL/uL    Hemoglobin 15.4 13.0 - 17.0 g/dL   HCT 21.1 94.1 - 74.0 %   MCV 88.2 80.0 - 100.0 fL   MCH 29.3 26.0 - 34.0 pg   MCHC 33.2 30.0 - 36.0 g/dL   RDW 81.4 48.1 - 85.6 %   Platelets 364 150 - 400 K/uL   nRBC 0.0 0.0 - 0.2 %    Blood Alcohol level:  Lab Results  Component Value Date   ETH <5 04/08/2015    Metabolic Disorder Labs: Lab Results  Component Value Date   HGBA1C 5.4 01/06/2022   MPG 108.28 01/06/2022   No results found for: "PROLACTIN" Lab Results  Component Value Date   CHOL 187 01/06/2022   TRIG 185 (H) 01/06/2022   HDL 33 (L) 01/06/2022   CHOLHDL 5.7 01/06/2022   VLDL 37 01/06/2022   LDLCALC 117 (H) 01/06/2022    Physical Findings:  Musculoskeletal: Strength & Muscle Tone: within normal limits Gait & Station: normal  Psychiatric Specialty Exam:  Presentation  General Appearance: Appropriate for Environment; Casual   Eye Contact:Good   Speech:Clear and Coherent; Normal Rate   Speech Volume:Normal   Handedness:Right    Mood and Affect  Mood:Depressed; Hopeless   Affect:Depressed    Thought Process  Thought Processes:Coherent; Goal Directed; Linear   Descriptions of Associations:Intact   Orientation:Full (Time, Place and Person)   Thought Content:Logical; WDL   History of Schizophrenia/Schizoaffective disorder:No data recorded  Duration of Psychotic Symptoms:No data recorded  Hallucinations:No data recorded Ideas of Reference:None   Suicidal Thoughts:No data recorded Homicidal Thoughts:No data recorded  Sensorium  Memory:Immediate Good; Recent Fair   Judgment:Fair   Insight:Fair    Executive Functions  Concentration:Good   Attention Span:Good   Recall:Good   Fund of Knowledge:Good   Language:Good    Psychomotor Activity  Psychomotor Activity:No data recorded  Assets  Assets:Communication Skills; Desire for Improvement; Financial Resources/Insurance; Housing; Intimacy; Leisure Time;  Physical Health; Resilience; Social Support    Sleep  Sleep:No data recorded   Physical Exam: Review of Systems  Respiratory:  Negative for shortness of breath.   Cardiovascular:  Negative for chest pain.  Gastrointestinal:  Positive for nausea. Negative for abdominal pain, constipation, diarrhea, heartburn and vomiting.  Neurological:  Negative for headaches.   Blood pressure 121/75, pulse (!) 114, temperature 97.9 F (36.6 C), temperature source Oral, resp. rate 16, height 5\' 10"  (1.778 m), weight 112.5 kg, SpO2 97 %. Body mass index is 35.58 kg/m.   ASSESSMENT AND PLAN Eduardo Barnes is a 46 y.o. male with hx of MDD, GAD with panic attacks, and degenerative disk disease previously on  disability who presented to [facility] voluntarily with passive SI and worsening depression. This is hospitalization day 2.  Patient mood and anxiety appear to be improving as he has been removed from ED previous stressors that he has been dealing with.  Patient is interested in [facility] but does have a financial barrier so this will need to be accounted for.  Patient has no other concerns at this time.  Denies somatic complaints from medication adjustments.  Safety and Monitoring: voluntarily admission to inpatient psychiatric unit for safety, stabilization and treatment Daily contact with patient to assess and evaluate symptoms and progress in treatment Appropriate medication management to further stabilize patient Patient's case will be regularly discussed in multi-disciplinary team meeting Observation Level : q15 minute checks Vital signs: q12 hours Precautions: suicide, elopement, and assault   2. Psychiatric Problems Major Depressive Disorder, recurrent episode, severe GAD Social Anxiety Disorder -Discontinue Wellbutrin SR -Increase venlafaxine XR to 75 mg for depressive symptoms and anxiety -Continue Propranolol 10 mg twice daily for social anxiety -Switch from trazodone to scheduled hydroxyzine  50 mg qhs for insomnia -Encouraged patient to participate in unit milieu and in scheduled group therapies      3. Medical Management GERD Hx of C Diff  Dyslipidemia -Continue colestipol 1 g daily  after supper -Continue Pepcid 20 mg daily   Nicotine use disorder -Continue nicotine patch 14 mg   PRN The following PRN medications were added to ensure patient can focus on treatment. These were discussed with patient and patient aware of ability to ask for the following medications:  -Tylenol 650 mg q6hr PRN for mild pain -Mylanta 30 ml suspension for indigestion -Milk of Magnesia 30 ml for constipation -Trazodone 50 mg qhs for insomnia -Hydroxyzine 25 mg tid PRN for anxiety   4. Discharge Planning Patient will require the following based on my assessment:  Greatly appreciate CSW and Case management assistance with facilitating these needs and any further recommendations regarding patient's needs upon discharge. Estimated LOS: 5 days Discharge Concerns: Need to establish a safety plan; Medication compliance and effectiveness Discharge Goals: Return home with outpatient referrals for mental health follow-up including medication management/psychotherapy     Park Pope, MD 01/09/2022, 9:56 AM

## 2022-01-09 NOTE — [facility] Counselor (Signed)
CSW spoke with the Pt about SAIOP and PHP services.  The Pt states that he has been sober for 8 and a half years.  CSW explained PHP services to the Pt with cost, times, and length of program.  The Pt states that they are interested in [facility] and can meet the time commitment.  CSW will schedule an appointment with PHP for the Pt.

## 2022-01-09 NOTE — Progress Notes (Signed)
   01/09/22 0500  Sleep  Number of Hours 7    

## 2022-01-09 NOTE — Progress Notes (Signed)
   01/09/22 2309  Psych Admission Type (Psych Patients Only)  Admission Status Voluntary  Psychosocial Assessment  Patient Complaints Sleep disturbance  Eye Contact Brief  Facial Expression Flat  Affect Appropriate to circumstance  Speech Logical/coherent  Interaction Minimal  Motor Activity Other (Comment) (WDL)  Appearance/Hygiene Unremarkable  Behavior Characteristics Appropriate to situation  Mood Other (Comment) (WDL)  Thought Process  Coherency WDL  Content WDL  Delusions None reported or observed  Perception WDL  Hallucination None reported or observed  Judgment Impaired  Confusion None  Danger to Self  Current suicidal ideation? Denies  Agreement Not to Harm Self Yes  Description of Agreement verbal  Danger to Others  Danger to Others None reported or observed

## 2022-01-09 NOTE — Group Note (Signed)
Recreation Therapy Group Note   Group Topic:Stress Management  Group Date: 01/09/2022 Start Time: 0930 End Time: 0950 Facilitators: Caroll Rancher, [place] Location: [address] Dayroom   Goal Area(s) Addresses:  Patient will identify positive stress management techniques. Patient will identify benefits of using stress management post d/c.  Group Description:  Meditation.  LRT played a meditation that focused on meditating for a calm and relaxing mood to start your day.  Patients were to listen as the meditation played to engage in the activity and focus on the affirmations throughout the meditation.    Affect/Mood: N/A   Participation Level: Did not attend    Clinical Observations/Individualized Feedback:     Plan: Continue to engage patient in RT group sessions 2-3x/week.   Caroll Rancher, LRT,CTRS 01/09/2022 10:42 AM

## 2022-01-09 NOTE — Progress Notes (Signed)
Patient reported poor sleep last night. He believes the trazadone is the source of the issue stating that he had this happen to him at home as well. Patient denies SI/HI/AVH and reports feeling well, except being sleepy. Patient reported mild nausea yesterday after eating. RN will continue to monitor and provide interventions as needed.  01/09/22 0758  Psych Admission Type (Psych Patients Only)  Admission Status Voluntary  Psychosocial Assessment  Patient Complaints Sleep disturbance  Eye Contact Brief  Facial Expression Flat  Affect Other (Comment) (tired/sleepy)  Speech Logical/coherent  Interaction Minimal  Motor Activity Slow  Appearance/Hygiene Unremarkable  Behavior Characteristics Cooperative  Mood Other (Comment) (sleepy/tired)  Thought Process  Coherency WDL  Content WDL  Delusions None reported or observed  Perception WDL  Hallucination None reported or observed  Judgment WDL  Confusion None  Danger to Self  Current suicidal ideation? Denies  Agreement Not to Harm Self Yes  Description of Agreement Verbal  Danger to Others  Danger to Others None reported or observed

## 2022-01-10 DIAGNOSIS — F332 Major depressive disorder, recurrent severe without psychotic features: Secondary | ICD-10-CM | POA: Diagnosis not present

## 2022-01-10 MED ORDER — VENLAFAXINE HCL ER 75 MG PO CP24
150.0000 mg | ORAL_CAPSULE | Freq: Every day | ORAL | Status: DC
  Administered 2022-01-11 – 2022-01-12 (×2): 150 mg via ORAL
  Filled 2022-01-10: qty 2
  Filled 2022-01-10: qty 1
  Filled 2022-01-10 (×2): qty 2

## 2022-01-10 MED ORDER — VENLAFAXINE HCL ER 75 MG PO CP24
75.0000 mg | ORAL_CAPSULE | Freq: Once | ORAL | Status: AC
  Administered 2022-01-10: 75 mg via ORAL
  Filled 2022-01-10: qty 1

## 2022-01-10 NOTE — Progress Notes (Signed)
   01/10/22 2324  Psych Admission Type (Psych Patients Only)  Admission Status Voluntary  Psychosocial Assessment  Patient Complaints None  Eye Contact Brief  Facial Expression Animated  Affect Appropriate to circumstance  Speech Logical/coherent  Interaction Assertive  Motor Activity Other (Comment) (WDL)  Appearance/Hygiene Unremarkable  Behavior Characteristics Appropriate to situation  Mood Pleasant  Thought Process  Coherency WDL  Content WDL  Delusions None reported or observed  Perception WDL  Hallucination None reported or observed  Judgment Impaired  Confusion None  Danger to Self  Current suicidal ideation? Denies  Agreement Not to Harm Self Yes  Description of Agreement verbal  Danger to Others  Danger to Others None reported or observed

## 2022-01-10 NOTE — Progress Notes (Signed)
Adult Psychoeducational Group Note  Date:  01/10/2022 Time:  8:37 PM  Group Topic/Focus:  Wrap-Up Group:   The focus of this group is to help patients review their daily goal of treatment and discuss progress on daily workbooks.  Participation Level:  Active  Participation Quality:  Appropriate  Affect:  Appropriate  Cognitive:  Appropriate  Insight: Appropriate  Engagement in Group:  Engaged  Modes of Intervention:  Discussion       Additional Comments:  Eduardo Barnes said his day was 8. His goal for today attend group and participate. Eduardo Barnes achieved his goal . Coping skills how to identify harmful thinking. The color that describe his day Eduardo Barnes 01/10/2022, 8:37 PM

## 2022-01-10 NOTE — [facility] Group Notes (Signed)
Psychoeducational Group Note    Date:  01/10/2022 Time: 1300-1400    Purpose of Group: . The group focus' on teaching patients on how to identify their needs and their Life Skills:  A group where two lists are made. What people need and what are things that we do that are unhealthy. The lists are developed by the patients and it is explained that we often do the actions that are not healthy to get our list of needs met.  Goal:: to develop the coping skills needed to get their needs met  Participation Level:  Active  Participation Quality:  Appropriate  Affect:  Appropriate  Cognitive:  Oriented  Insight: Improving  Engagement in Group:  Engaged  Modes of Intervention:  Activity, Discussion, Education, and Support  Additional Comments:  Rates his energy at a 7/10. States he is working on his homework. Participated fully in the group.  Paulino Rily

## 2022-01-10 NOTE — [facility] Group Notes (Signed)
Goals Group 01/10/2022   Group Focus: affirmation, clarity of thought, and goals/reality orientation Treatment Modality:  Psychoeducation Interventions utilized were assignment, group exercise, and support Purpose: To be able to understand and verbalize the reason for their admission to the hospital. To understand that the medication helps with their chemical imbalance but they also need to work on their choices in life. To be challenged to develop a list of 30 positives about themselves. Also introduce the concept that "feelings" are not reality.  Participation Level:  Active  Participation Quality:  Appropriate  Affect:  Appropriate  Cognitive:  Appropriate  Insight:  Improving  Engagement in Group:  Engaged  Additional Comments:  Rates his energy as a 3/10. States that he is here to learn coping skills.  Dione Housekeeper

## 2022-01-10 NOTE — Group Note (Signed)
[facility] LCSW Group Therapy Note  Date/Time:    01/10/2022 10:00-11:00AM  Type of Therapy and Topic:  Group Therapy:  Shame and its Impact on My Life  Participation Level:  Active   Description of Group:  The focus of this group was to examine our tendency to be hyper-critical of self and how this leads to feelings of worthlessness, hopelessness, and shame.  Patients were guided to the concept that shame is universal and is worsened by being kept hidden, but improved by being revealed.  We discussed how feeling unworthy is the result of shame and discussed the differences between guilt  ("I did something bad" "I made a mistake" "I did something stupid") and shame ("I am bad" "I am a mistake" "I am stupid") .  We discussed that feelings are not necessarily based in facts.  We also talked about what happens when we do something to numb our negative feelings and how that actually numbs our positive feelings at the same time.  Therapeutic Goals Identify statements patients automatically say to themselves, "I'll be worthy when...."  Examine how this is unkind to ourselves because it indicates we cannot be worthy until some far-reaching, possibly even unreachable, goal is achieved. Talk about the frequent use of unhealthy coping skills used when feeling unworthy Allow patients to discuss their shame out loud in order to reduce its power over them  Summary of Patient Progress: During group, patient expressed "I'll be worthy when I can stop fighting these mental battles with myself."   He participated fully throughout group, related to other patients, was supportive of others.  He stated he benefited from the group and was glad he had come.  Therapeutic Modalities Processing  Ambrose Mantle, LCSW 01/10/2022, 2:43 PM

## 2022-01-10 NOTE — Progress Notes (Signed)
Adult Psychoeducational Group Note  Date:  01/10/2022 Time:  5:07 AM  Group Topic/Focus:  Wrap-Up Group:   The focus of this group is to help patients review their daily goal of treatment and discuss progress on daily workbooks.  Participation Level:  Active  Participation Quality:  Appropriate  Affect:  Appropriate  Cognitive:  Appropriate  Insight: Appropriate  Engagement in Group:  Engaged  Modes of Intervention:  Discussion  Additional Comments:  Pt attended and participated in AA group.  Daziya Redmond Katrinka Blazing 01/10/2022, 5:07 AM

## 2022-01-10 NOTE — Progress Notes (Signed)
Patient reported that the hydroxyzine given at bedtime was more effective than the previously given trazadone. He denies SI/HI/AVH. RN will continue to monitor and supply interventions as necessary.   01/10/22 0743  Psych Admission Type (Psych Patients Only)  Admission Status Voluntary  Psychosocial Assessment  Patient Complaints None  Eye Contact Brief  Facial Expression Flat  Affect Appropriate to circumstance  Speech Logical/coherent  Interaction Minimal  Motor Activity Slow  Appearance/Hygiene Unremarkable  Behavior Characteristics Appropriate to situation  Mood Euthymic  Thought Process  Coherency WDL  Content WDL  Delusions None reported or observed  Perception WDL  Hallucination None reported or observed  Judgment Impaired  Confusion None  Danger to Self  Current suicidal ideation? Denies  Agreement Not to Harm Self Yes  Description of Agreement Verbal  Danger to Others  Danger to Others None reported or observed

## 2022-01-10 NOTE — Progress Notes (Addendum)
[facility] MD Progress Note  01/10/2022 3:46 PM Eduardo Barnes  MRN:  [id number] Principal Problem: MDD (major depressive disorder), recurrent severe, without psychosis (HCC) Diagnosis: Principal Problem:   MDD (major depressive disorder), recurrent severe, without psychosis (HCC)   Reason for Admission: Eduardo Barnes is a 46 y.o. male with hx of MDD, GAD with panic attacks, and degenerative disk disease previously on disability who presented to [facility] voluntarily with passive SI and worsening depression.  Recent stressors include learning about his wife's infidelity.  This is hospitalization day 3.  Subjective:  On interview and assessment today, the patient appears depressed. He reports that he believes the Effexor is making him tired.  He states that his anxiety levels have decreased significantly since admission.  He reports appropriate sleep and appetite.  He denies suicidal or homicidal thoughts.  He denies auditory visual hallucinations.  He denies other side effects to the medication.  He reports that he is interested in the [place] virtual PHP program as he has no money for gas.  He states that he is currently on disability but in the meantime is living with his aunt while getting help from his wife.   Objective:   Total Time spent with patient: 15 minutes  Past Psychiatric History:  Previous Psych Diagnoses: MDD, GAD, panic disorder Prior inpatient treatment: 1 month when 12 after separation of mom and dad Current/prior outpatient treatment:wellbutrin SR 150 mg and trazodone 50 mg qhs Psychotherapy PP:[id number] History of suicide:denies History of homicide:denies Psychiatric medication history:paxil (profuse sweating) Psychiatric medication compliance history:good Neuromodulation history: denies Current Psychiatrist:Dr. Gilmore Laroche Current therapist: Coolidge Breeze  Past Medical History:  Past Medical History:  Diagnosis Date   Arthritis    "probably a touch in my back" (11/15/2014)    Chronic lower back pain    DDD (degenerative disc disease), lumbar    Difficulty coping    Diverticulitis 04/04/2015   MDD (major depressive disorder)    Neck pain    Seizure (HCC)    had 1 seizure after back surgery from "too many muscle relaxers;.On no meds and no more seizure    Past Surgical History:  Procedure Laterality Date   ANTERIOR CERVICAL DECOMP/DISCECTOMY FUSION N/A 08/30/2014   Procedure: ANTERIOR CERVICAL DECOMPRESSION/DISCECTOMY FUSION CERVICAL FIVE-SIX,CERVICAL SIX-SEVEN;  Surgeon: Tia Alert, MD;  Location[facility];  Service: Neurosurgery;  Laterality: N/A;   COLONOSCOPY WITH PROPOFOL N/A 05/13/2015   Procedure: COLONOSCOPY WITH PROPOFOL;  Surgeon: Corbin Ade, MD;  Location: [facility];  Service: Endoscopy;  Laterality: N/A;  1030   POSTERIOR LUMBAR FUSION  11/15/2014   TONSILLECTOMY     Family History:  Family History  Problem Relation Age of Onset   Heart disease Mother    Seizures Mother    Migraines Mother    Heart attack Father    Colon cancer Neg Hx        Thinks his Aunt may have been diagnosed but no first degree relatives   Family Psychiatric  History:  Psych: mom with depression and anxiety, sister with panic attacks SA/HA: denies Substance use family hx: sister alcohol  Social History:  Social History   Substance and Sexual Activity  Alcohol Use No   Alcohol/week: 0.0 standard drinks of alcohol     Social History   Substance and Sexual Activity  Drug Use No    Social History   Socioeconomic History   Marital status: Married    Spouse name: Not on file  Number of children: 0   Years of education: GED   Highest education level: Not on file  Occupational History   Occupation: Unemployed  Tobacco Use   Smoking status: Every Day    Packs/day: 0.50    Years: 24.00    Total pack years: 12.00    Types: Cigarettes   Smokeless tobacco: Never   Tobacco comments:    11/15/2014 "down from 1 1/2 ppd in 08/2014"  Vaping Use    Vaping Use: Never used  Substance and Sexual Activity   Alcohol use: No    Alcohol/week: 0.0 standard drinks of alcohol   Drug use: No   Sexual activity: Yes    Birth control/protection: Other-see comments    Comment: Wife had tubaligation  Other Topics Concern   Not on file  Social History Narrative   Lives at home with his wife and step-children.   Right-handed.   3/4 - 1 two liter of soda per day.   Social Determinants of Health   Financial Resource Strain: Not on file  Food Insecurity: No Food Insecurity (01/07/2022)   Hunger Vital Sign    Worried About Running Out of Food in the Last Year: Never true    Ran Out of Food in the Last Year: Never true  Transportation Needs: Not on file  Physical Activity: Not on file  Stress: Not on file  Social Connections: Not on file   Additional Social History:                         Current Medications: Current Facility-Administered Medications  Medication Dose Route Frequency Provider Last Rate Last Admin   acetaminophen (TYLENOL) tablet 650 mg  650 mg Oral Q6H PRN Lenard Lance, FNP       alum & mag hydroxide-simeth (MAALOX/MYLANTA) 200-200-20 MG/5ML suspension 30 mL  30 mL Oral Q4H PRN Lenard Lance, FNP       colestipol (COLESTID) tablet 1 g  1 g Oral QPC supper Ntuen, Tina C, FNP   1 g at 01/09/22 1827   famotidine (PEPCID) tablet 20 mg  20 mg Oral Daily Lenard Lance, FNP   20 mg at 01/10/22 0743   feeding supplement (ENSURE ENLIVE / ENSURE PLUS) liquid 237 mL  237 mL Oral BID BM Massengill, Nathan, MD       hydrOXYzine (ATARAX) tablet 25 mg  25 mg Oral TID PRN Lenard Lance, FNP       hydrOXYzine (ATARAX) tablet 50 mg  50 mg Oral [name], MD   50 mg at 01/09/22 2227   magnesium hydroxide (MILK OF MAGNESIA) suspension 30 mL  30 mL Oral Daily PRN Lenard Lance, FNP       nicotine (NICODERM CQ - dosed in mg/24 hours) patch 14 mg  14 mg Transdermal Daily Lenard Lance, FNP   14 mg at 01/10/22 0742   propranolol  (INDERAL) tablet 10 mg  10 mg Oral BID Park Pope, MD   10 mg at 01/10/22 0743   [START ON 01/11/2022] venlafaxine XR (EFFEXOR-XR) 24 hr capsule 150 mg  150 mg Oral Q breakfast Carlyn Reichert, MD        Lab Results:  No results found for this or any previous visit (from the past 24 hour(s)).   Blood Alcohol level:  Lab Results  Component Value Date   [facility] <5 04/08/2015    Metabolic Disorder Labs: Lab Results  Component Value Date  HGBA1C 5.4 01/06/2022   MPG 108.28 01/06/2022   No results found for: "PROLACTIN" Lab Results  Component Value Date   CHOL 187 01/06/2022   TRIG 185 (H) 01/06/2022   HDL 33 (L) 01/06/2022   CHOLHDL 5.7 01/06/2022   VLDL 37 01/06/2022   LDLCALC 117 (H) 01/06/2022    Physical Findings:  Musculoskeletal: Strength & Muscle Tone: within normal limits Gait & Station: normal  Psychiatric Specialty Exam:  Presentation  General Appearance: Appropriate for Environment; Casual   Eye Contact:Good   Speech:Clear and Coherent; Normal Rate   Speech Volume:Normal   Handedness:Right    Mood and Affect  Mood: depressed, improving  Affect: depressed   Thought Process  Thought Processes:Coherent; Goal Directed; Linear   Descriptions of Associations:Intact   Orientation:Full (Time, Place and Person)   Thought Content:Logical; WDL   History of Schizophrenia/Schizoaffective disorder: none  Duration of Psychotic Symptoms: none  Hallucinations: denies Ideas of Reference:None   Suicidal Thoughts: denies Homicidal Thoughts: denies  Sensorium  Memory:Immediate Good; Recent Fair   Judgment:Fair   Insight:Fair    Executive Functions  Concentration:Good   Attention Span:Good   Recall:Good   Fund of Knowledge:Good   Language:Good    Psychomotor Activity  Psychomotor Activity:No data recorded  Assets  Assets:Communication Skills; Desire for Improvement; Financial Resources/Insurance; Housing; Intimacy;  Leisure Time; Physical Health; Resilience; Social Support    Sleep  Sleep: fair   Physical Exam: Physical Exam Constitutional:      Appearance: the patient is not toxic-appearing.  Pulmonary:     Effort: Pulmonary effort is normal.  Neurological:     General: No focal deficit present.     Mental Status: the patient is alert and oriented to person, place, and time.   Review of Systems  Respiratory:  Negative for shortness of breath.   Cardiovascular:  Negative for chest pain.  Gastrointestinal:  Negative for abdominal pain, constipation, diarrhea, nausea and vomiting.  Neurological:  Negative for headaches.   Blood pressure 106/76, pulse (!) 102, temperature 97.7 F (36.5 C), temperature source Oral, resp. rate 16, height 5\' 10"  (1.778 m), weight 112.5 kg, SpO2 98 %. Body mass index is 35.58 kg/m.   ASSESSMENT AND PLAN JOSEMIGUEL GRIES is a 46 y.o. male with hx of MDD, GAD with panic attacks, and degenerative disk disease previously on  disability who presented to [facility] voluntarily with passive SI and worsening depression. This is hospitalization day 3.  Patient mood and anxiety appear to be improving as he has been removed from ED previous stressors that he has been dealing with.  Patient is interested in [facility] but does have a financial barrier so this will need to be accounted for.  Patient has no other concerns at this time.  Denies somatic complaints from medication adjustments.  Safety and Monitoring: voluntarily admission to inpatient psychiatric unit for safety, stabilization and treatment Daily contact with patient to assess and evaluate symptoms and progress in treatment Appropriate medication management to further stabilize patient Patient's case will be regularly discussed in multi-disciplinary team meeting Observation Level : q15 minute checks Vital signs: q12 hours Precautions: suicide, elopement, and assault   2. Psychiatric Problems Major Depressive Disorder,  recurrent episode, severe GAD Social Anxiety Disorder -Discontinue Wellbutrin SR -Increase venlafaxine XR to 150 mg for depressive symptoms and anxiety -Continue Propranolol 10 mg twice daily for social anxiety -Switch from trazodone to scheduled hydroxyzine 50 mg qhs for insomnia -Encouraged patient to participate in unit milieu and in scheduled group  therapies   3. Medical Management GERD Hx of C Diff  Dyslipidemia -Continue colestipol 1 g daily after supper -Continue Pepcid 20 mg daily   Nicotine use disorder -Continue nicotine patch 14 mg   PRN The following PRN medications were added to ensure patient can focus on treatment. These were discussed with patient and patient aware of ability to ask for the following medications:  -Tylenol 650 mg q6hr PRN for mild pain -Mylanta 30 ml suspension for indigestion -Milk of Magnesia 30 ml for constipation -Trazodone 50 mg qhs for insomnia -Hydroxyzine 25 mg tid PRN for anxiety   4. Discharge Planning Patient will require the following based on my assessment:  Greatly appreciate CSW and Case management assistance with facilitating these needs and any further recommendations regarding patient's needs upon discharge. Estimated LOS: 5 days Discharge Concerns: Need to establish a safety plan; Medication compliance and effectiveness Discharge Goals: Return home with outpatient referrals for mental health follow-up including medication management/psychotherapy -- Patient to follow-up with the [facility] PHP program, further information to be provided by LCSW.    Carlyn Reichert, MD 01/10/2022, 3:46 PM

## 2022-01-11 ENCOUNTER — Other Ambulatory Visit (HOSPITAL_COMMUNITY): Payer: Self-pay | Admitting: Psychiatry

## 2022-01-11 DIAGNOSIS — F332 Major depressive disorder, recurrent severe without psychotic features: Secondary | ICD-10-CM | POA: Diagnosis not present

## 2022-01-11 NOTE — Progress Notes (Signed)
[facility] Group Notes:  (Nursing/MHT/Case Management/Adjunct)  Date:  01/11/2022  Time:  2000 Type of Therapy:   wrap up group  Participation Level:  Active  Participation Quality:  Appropriate, Attentive, Sharing, and Supportive  Affect:  Flat  Cognitive:  Alert  Insight:  Improving  Engagement in Group:  Engaged  Modes of Intervention:  Clarification, Education, and Support  Summary of Progress/Problems: Positive thinking and positive change were discussed.   Eduardo Barnes 01/11/2022, 8:58 PM

## 2022-01-11 NOTE — Progress Notes (Signed)
[facility] MD Progress Note  01/11/2022 3:26 PM Eduardo Barnes  MRN:  [id number] Principal Problem: MDD (major depressive disorder), recurrent severe, without psychosis (HCC) Diagnosis: Principal Problem:   MDD (major depressive disorder), recurrent severe, without psychosis (HCC)   Reason for Admission: Eduardo Barnes is a 46 y.o. male with hx of MDD, GAD with panic attacks, and degenerative disk disease previously on disability who presented to [facility] voluntarily with passive SI and worsening depression.  Recent stressors include learning about his wife's infidelity.  This is hospitalization day 4.  Subjective:  On interview and assessment today, the patient reports a euthymic mood.  His affect appears congruent.  The patient reports sleeping poorly secondary to every 15 minute checks.  He reports that he feels like he will be sleeping better if you are at home.  He reports that he is not experiencing any side effects from the Effexor.  He reports eating well.  He denies experiencing any suicidal thoughts.  He denies experiencing any homicidal thoughts.  He denies experiencing auditory or visual hallucinations.  He reports that he understands his discharge plan and is amenable to discharge tomorrow with [facility] PHP follow-up outpatient.   Objective:   Total Time spent with patient: 15 minutes  Past Psychiatric History:  Previous Psych Diagnoses: MDD, GAD, panic disorder Prior inpatient treatment: 1 month when 12 after separation of mom and dad Current/prior outpatient treatment:wellbutrin SR 150 mg and trazodone 50 mg qhs Psychotherapy [id number] History of suicide:denies History of homicide:denies Psychiatric medication history:paxil (profuse sweating) Psychiatric medication compliance history:good Neuromodulation history: denies Current Psychiatrist:Dr. Gilmore Barnes Current therapist: Coolidge Barnes  Past Medical History:  Past Medical History:  Diagnosis Date   Arthritis    "probably a touch  in my back" (11/15/2014)   Chronic lower back pain    DDD (degenerative disc disease), lumbar    Difficulty coping    Diverticulitis 04/04/2015   MDD (major depressive disorder)    Neck pain    Seizure (HCC)    had 1 seizure after back surgery from "too many muscle relaxers;.On no meds and no more seizure    Past Surgical History:  Procedure Laterality Date   ANTERIOR CERVICAL DECOMP/DISCECTOMY FUSION N/A 08/30/2014   Procedure: ANTERIOR CERVICAL DECOMPRESSION/DISCECTOMY FUSION CERVICAL FIVE-SIX,CERVICAL SIX-SEVEN;  Surgeon: Eduardo Alert, MD;  Location: [facility]S;  Service: Neurosurgery;  Laterality: N/A;   COLONOSCOPY WITH PROPOFOL N/A 05/13/2015   Procedure: COLONOSCOPY WITH PROPOFOL;  Surgeon: Eduardo Ade, MD;  Location: [facility];  Service: Endoscopy;  Laterality: N/A;  1030   POSTERIOR LUMBAR FUSION  11/15/2014   TONSILLECTOMY     Family History:  Family History  Problem Relation Age of Onset   Heart disease Mother    Seizures Mother    Migraines Mother    Heart attack Father    Colon cancer Neg Hx        Thinks his Aunt may have been diagnosed but no first degree relatives   Family Psychiatric  History:  Psych: mom with depression and anxiety, sister with panic attacks SA/HA: denies Substance use family hx: sister alcohol  Social History:  Social History   Substance and Sexual Activity  Alcohol Use No   Alcohol/week: 0.0 standard drinks of alcohol     Social History   Substance and Sexual Activity  Drug Use No    Social History   Socioeconomic History   Marital status: Married    Spouse name: Not on file  Number of children: 0   Years of education: GED   Highest education level: Not on file  Occupational History   Occupation: Unemployed  Tobacco Use   Smoking status: Every Day    Packs/day: 0.50    Years: 24.00    Total pack years: 12.00    Types: Cigarettes   Smokeless tobacco: Never   Tobacco comments:    11/15/2014 "down from 1 1/2 ppd in  08/2014"  Vaping Use   Vaping Use: Never used  Substance and Sexual Activity   Alcohol use: No    Alcohol/week: 0.0 standard drinks of alcohol   Drug use: No   Sexual activity: Yes    Birth control/protection: Other-see comments    Comment: Wife had tubaligation  Other Topics Concern   Not on file  Social History Narrative   Lives at home with his wife and step-children.   Right-handed.   3/4 - 1 two liter of soda per day.   Social Determinants of Health   Financial Resource Strain: Not on file  Food Insecurity: No Food Insecurity (01/07/2022)   Hunger Vital Sign    Worried About Running Out of Food in the Last Year: Never true    Ran Out of Food in the Last Year: Never true  Transportation Needs: Not on file  Physical Activity: Not on file  Stress: Not on file  Social Connections: Not on file   Additional Social History:                         Current Medications: Current Facility-Administered Medications  Medication Dose Route Frequency Provider Last Rate Last Admin   acetaminophen (TYLENOL) tablet 650 mg  650 mg Oral Q6H PRN Eduardo Lance, FNP       alum & mag hydroxide-simeth (MAALOX/MYLANTA) 200-200-20 MG/5ML suspension 30 mL  30 mL Oral Q4H PRN Eduardo Lance, FNP       colestipol (COLESTID) tablet 1 g  1 g Oral QPC supper Ntuen, Tina C, FNP   1 g at 01/10/22 1806   famotidine (PEPCID) tablet 20 mg  20 mg Oral Daily Eduardo Lance, FNP   20 mg at 01/11/22 0745   feeding supplement (ENSURE ENLIVE / ENSURE PLUS) liquid 237 mL  237 mL Oral BID BM Massengill, Nathan, MD       hydrOXYzine (ATARAX) tablet 25 mg  25 mg Oral TID PRN Eduardo Lance, FNP       hydrOXYzine (ATARAX) tablet 50 mg  50 mg Oral Eduardo Spates, MD   50 mg at 01/10/22 2158   magnesium hydroxide (MILK OF MAGNESIA) suspension 30 mL  30 mL Oral Daily PRN Eduardo Lance, FNP       nicotine (NICODERM CQ - dosed in mg/24 hours) patch 14 mg  14 mg Transdermal Daily Eduardo Lance, FNP   14 mg at 01/11/22  0745   propranolol (INDERAL) tablet 10 mg  10 mg Oral BID Park Pope, MD   10 mg at 01/11/22 0745   venlafaxine XR (EFFEXOR-XR) 24 hr capsule 150 mg  150 mg Oral Q breakfast Carlyn Reichert, MD   150 mg at 01/11/22 0745    Lab Results:  No results found for this or any previous visit (from the past 24 hour(s)).   Blood Alcohol level:  Lab Results  Component Value Date   [facility] <5 04/08/2015    Metabolic Disorder Labs: Lab Results  Component Value Date  HGBA1C 5.4 01/06/2022   MPG 108.28 01/06/2022   No results found for: "PROLACTIN" Lab Results  Component Value Date   CHOL 187 01/06/2022   TRIG 185 (H) 01/06/2022   HDL 33 (L) 01/06/2022   CHOLHDL 5.7 01/06/2022   VLDL 37 01/06/2022   LDLCALC 117 (H) 01/06/2022    Physical Findings:  Musculoskeletal: Strength & Muscle Tone: within normal limits Gait & Station: normal  Psychiatric Specialty Exam:  Presentation  General Appearance: Appropriate for Environment; Casual   Eye Contact:Good   Speech:Clear and Coherent; Normal Rate   Speech Volume:Normal   Handedness:Right    Mood and Affect  Mood: euthymic  Affect: congruent   Thought Process  Thought Processes:Coherent; Goal Directed; Linear   Descriptions of Associations:Intact   Orientation:Full (Time, Place and Person)   Thought Content:Logical; WDL   History of Schizophrenia/Schizoaffective disorder: none  Duration of Psychotic Symptoms: none  Hallucinations: denies Ideas of Reference:None   Suicidal Thoughts: denies Homicidal Thoughts: denies  Sensorium  Memory:Immediate Good; Recent Fair   Judgment:Fair   Insight:Fair    Executive Functions  Concentration:Good   Attention Span:Good   Recall:Good   Fund of Knowledge:Good   Language:Good    Psychomotor Activity  Psychomotor Activity:No data recorded  Assets  Assets:Communication Skills; Desire for Improvement; Financial Resources/Insurance; Housing;  Intimacy; Leisure Time; Physical Health; Resilience; Social Support    Sleep  Sleep: fair   Physical Exam: Physical Exam Constitutional:      Appearance: the patient is not toxic-appearing.  Pulmonary:     Effort: Pulmonary effort is normal.  Neurological:     General: No focal deficit present.     Mental Status: the patient is Barnes and oriented to person, place, and time.   Review of Systems  Respiratory:  Negative for shortness of breath.   Cardiovascular:  Negative for chest pain.  Gastrointestinal:  Negative for abdominal pain, constipation, diarrhea, nausea and vomiting.  Neurological:  Negative for headaches.   Blood pressure 115/82, pulse 96, temperature 97.8 F (36.6 Barnes), temperature source Oral, resp. rate 16, height 5\' 10"  (1.778 m), weight 112.5 kg, SpO2 98 %. Body mass index is 35.58 kg/m.   ASSESSMENT AND PLAN Eduardo Barnes is a [age] y.o. male with hx of MDD, GAD with panic attacks, and degenerative disk disease previously on  disability who presented to [facility] voluntarily with passive SI and worsening depression. This is hospitalization day 4.  Patient mood and anxiety appear to be improving as he has been removed from ED previous stressors that he has been dealing with.  [facility] does have a [facility] barrier so this will need to be accounted for.  Patient has no other concerns at this time.  Denies somatic complaints from medication adjustments.  Safety and Monitoring: voluntarily admission to inpatient psychiatric unit for safety, stabilization and treatment Daily contact with patient to assess and evaluate symptoms and progress in treatment Appropriate medication management to further stabilize patient Patient's case will be regularly discussed in multi-disciplinary team meeting Observation Level : q15 minute checks Vital signs: q12 hours Precautions: suicide, elopement, and assault   2. Psychiatric Problems Major Depressive  Disorder, recurrent episode, severe GAD Social Anxiety Disorder -Discontinue Wellbutrin SR -Continue venlafaxine XR 150 mg for depressive symptoms and anxiety -Continue Propranolol 10 mg twice daily for social anxiety -Switch from trazodone to scheduled hydroxyzine 50 mg qhs for insomnia -Encouraged patient to participate in unit milieu and in scheduled group therapies  3. Medical Management GERD Hx of Barnes Diff  Dyslipidemia -Continue colestipol 1 g daily after supper -Continue Pepcid 20 mg daily   Nicotine use disorder -Continue nicotine patch 14 mg   PRN The following PRN medications were added to ensure patient can focus on treatment. These were discussed with patient and patient aware of ability to ask for the following medications:  -Tylenol 650 mg q6hr PRN for mild pain -Mylanta 30 ml suspension for indigestion -Milk of Magnesia 30 ml for constipation -Trazodone 50 mg qhs for insomnia -Hydroxyzine 25 mg tid PRN for anxiety   4. Discharge Planning Patient will require the following based on my assessment:  Greatly appreciate CSW and Case management assistance with facilitating these needs and any further recommendations regarding patient's needs upon discharge. Estimated LOS: 5 days Discharge Concerns: Need to establish a safety plan; Medication compliance and effectiveness Discharge Goals: Return home with outpatient referrals for mental health follow-up including medication management/psychotherapy -- Patient to follow-up with the [facility] PHP program, further information to be provided by LCSW.    Corky Sox, MD 01/11/2022, 3:26 PM

## 2022-01-11 NOTE — Progress Notes (Signed)
   01/11/22 2217  Psych Admission Type (Psych Patients Only)  Admission Status Voluntary  Psychosocial Assessment  Patient Complaints None  Eye Contact Fair  Facial Expression Animated  Affect Appropriate to circumstance  Speech Logical/coherent  Interaction Assertive  Motor Activity Other (Comment) (WDL)  Appearance/Hygiene Unremarkable  Behavior Characteristics Appropriate to situation  Mood Pleasant  Thought Process  Coherency WDL  Content WDL  Delusions None reported or observed  Perception WDL  Judgment Impaired  Confusion None  Danger to Self  Current suicidal ideation? Denies  Agreement Not to Harm Self Yes  Description of Agreement verbal  Danger to Others  Danger to Others None reported or observed

## 2022-01-11 NOTE — [facility] Group Notes (Signed)
Adult Psychoeducational Group  Date:  01/11/2022 Time:  1300-1400  Group Topic/Focus: Continuation of the group from Saturday. Looking at the lists that were created and talking about what needs to be done with the homework of 30 positives about themselves.                                     Talking about taking their power back and helping themselves to develop Barnes positive self esteem.      Participation Quality:  did not attend Eduardo Barnes  

## 2022-01-11 NOTE — Group Note (Signed)
LCSW Group Therapy Note  01/11/2022      Type of Therapy and Topic:  Group Therapy: Gratitude  Participation Level:  Minimal   Description of Group:   In this group, patients shared and discussed the importance of acknowledging the elements in their lives for which they are grateful and how this can positively impact their mood.  The group discussed how bringing the positive elements of their lives to the forefront of their minds can help with recovery from any illness, physical or mental.  An exercise was done as a group in which a list was made of gratitude items in order to encourage participants to consider other potential positives in their lives.  Therapeutic Goals: Patients will identify one or more item for which they are grateful in each of 6 categories:  people, experience, thing, place, skill, and other. Patients will discuss how it is possible to seek out gratitude in even bad situations. Patients will explore other possible items of gratitude that they could remember.   Summary of Patient Progress:  The patient shared that his concept of gratitude is that it is "thankfulness."  He listened with obvious attentiveness but did not contribute to the conversation.   Therapeutic Modalities:   Solution-Focused Therapy Activity  Carloyn Jaeger Grossman-Orr, LCSW .

## 2022-01-11 NOTE — [facility] Group Notes (Signed)
Adult Psychoeducational Group Note Date:  01/11/2022 Time:  0900-1045 Group Topic/Focus: PROGRESSIVE RELAXATION. A group where deep breathing is taught and tensing and relaxation muscle groups is used. Imagery is used as well.  Pts are asked to imagine 3 pillars that hold them up when they are not able to hold themselves up and to share that with the group.  Participation Level:  Active  Participation Quality:  Appropriate  Affect:  Appropriate  Cognitive:  Oriented  Insight: Improving  Engagement in Group:  Engaged  Modes of Intervention:  Activity, Discussion, Education, and Support  Additional Comments:  Rates his energy at a 3/10. States his Brain, his heart and his soul hold him up.  Dione Housekeeper

## 2022-01-11 NOTE — Progress Notes (Signed)
D. Pt has been calm, cooperative on the unit- has been visible in the milieu, and observed attending groups. Pt reported that the groups have been very helpful, and that his goal was to continue to attend and be active in group therapy.Pt reported that he slept well last night, endorsed having a good appetite, good concentration, and normal energy level. Per pt's self inventory, pt rated his depression, hopelessness and anxiety a 1/1/0, respectively. .   Pt currently denies SI/HI and AVH   A. Labs and vitals monitored. Pt given and educated on medications. Pt supported emotionally and encouraged to express concerns and ask questions.   R. Pt remains safe with 15 minute checks. Will continue POC.

## 2022-01-12 ENCOUNTER — Encounter (HOSPITAL_COMMUNITY): Payer: Self-pay | Admitting: Family

## 2022-01-12 DIAGNOSIS — F332 Major depressive disorder, recurrent severe without psychotic features: Secondary | ICD-10-CM

## 2022-01-12 MED ORDER — PROPRANOLOL HCL 10 MG PO TABS: 10.0000 mg | ORAL_TABLET | Freq: Two times a day (BID) | ORAL | 0 refills | Status: AC

## 2022-01-12 MED ORDER — VENLAFAXINE HCL ER 150 MG PO CP24: 150.0000 mg | ORAL_CAPSULE | Freq: Every day | ORAL | 0 refills | Status: AC

## 2022-01-12 MED ORDER — HYDROXYZINE HCL 50 MG PO TABS: 50.0000 mg | ORAL_TABLET | Freq: Every day | ORAL | 0 refills | Status: AC

## 2022-01-12 NOTE — Discharge Summary (Signed)
Physician Discharge Summary Note  Patient:  Eduardo Barnes is an 46 y.o., male MRN:  [id number] DOB:  [birth year] Patient phone:  [phone] (home)  Patient address:   [address] [place] [address],  Total Time spent with patient: 45 minutes  Date of Admission:  01/07/2022 Date of Discharge: 01/12/2022  Reason for Admission:   Eduardo Barnes is a 46 y.o. male with hx of MDD, GAD with panic attacks, and degenerative disk disease previously on disability who presented to [facility] voluntarily with passive SI and worsening depression.  Recent stressors include learning about his wife's infidelity.  Principal Problem: MDD (major depressive disorder), recurrent severe, without psychosis (HCC) Discharge Diagnoses: Principal Problem:   MDD (major depressive disorder), recurrent severe, without psychosis (HCC)    Past Psychiatric History:  Previous Psych Diagnoses: MDD, GAD, panic disorder Prior inpatient treatment: 1 month when 12 after separation of mom and dad Current/prior outpatient treatment:wellbutrin SR 150 mg and trazodone 50 mg qhs Psychotherapy XL:[id number] History of suicide:denies History of homicide:denies Psychiatric medication history:paxil (profuse sweating) Psychiatric medication compliance history:good Neuromodulation history: denies Current Psychiatrist:Dr. Gilmore Laroche Current therapist: [facility] Breeze  Past Medical History:  Past Medical History:  Diagnosis Date   Arthritis    "probably a touch in my back" (11/15/2014)   Chronic lower back pain    DDD (degenerative disc disease), lumbar    Difficulty coping    Diverticulitis 04/04/2015   MDD (major depressive disorder)    Neck pain    Seizure (HCC)    had 1 seizure after back surgery from "too many muscle relaxers;.On no meds and no more seizure    Past Surgical History:  Procedure Laterality Date   ANTERIOR CERVICAL DECOMP/DISCECTOMY FUSION N/A 08/30/2014   Procedure: ANTERIOR CERVICAL  DECOMPRESSION/DISCECTOMY FUSION CERVICAL FIVE-SIX,CERVICAL SIX-SEVEN;  Surgeon: Tia Alert, MD;  Location: [facility];  Service: Neurosurgery;  Laterality: N/A;   COLONOSCOPY WITH PROPOFOL N/A 05/13/2015   Procedure: COLONOSCOPY WITH PROPOFOL;  Surgeon: Corbin Ade, MD;  Location: [facility];  Service: Endoscopy;  Laterality: N/A;  1030   POSTERIOR LUMBAR FUSION  11/15/2014   TONSILLECTOMY     Family History:  Family History  Problem Relation Age of Onset   Heart disease Mother    Seizures Mother    Migraines Mother    Heart attack Father    Colon cancer Neg Hx        Thinks his Aunt may have been diagnosed but no first degree relatives   Family Psychiatric  History:  Psych: mom with depression and anxiety, sister with panic attacks SA/HA: denies Substance use family hx: sister alcohol   Social History:  Social History   Substance and Sexual Activity  Alcohol Use No   Alcohol/week: 0.0 standard drinks of alcohol     Social History   Substance and Sexual Activity  Drug Use No    Social History   Socioeconomic History   Marital status: Married    Spouse name: Not on file   Number of children: 0   Years of education: GED   Highest education level: Not on file  Occupational History   Occupation: Unemployed  Tobacco Use   Smoking status: Every Day    Packs/day: 0.50    Years: 24.00    Total pack years: 12.00    Types: Cigarettes   Smokeless tobacco: Never   Tobacco comments:    11/15/2014 "down from 1 1/2 ppd in 08/2014"  Vaping Use  Vaping Use: Never used  Substance and Sexual Activity   Alcohol use: No    Alcohol/week: 0.0 standard drinks of alcohol   Drug use: No   Sexual activity: Yes    Birth control/protection: Other-see comments    Comment: Wife had tubaligation  Other Topics Concern   Not on file  Social History Narrative   Lives at home with his wife and step-children.   Right-handed.   3/4 - 1 two liter of soda per day.   Social  Determinants of Health   Financial Resource Strain: Not on file  Food Insecurity: No Food Insecurity (01/07/2022)   Hunger Vital Sign    Worried About Running Out of Food in the Last Year: Never true    Ran Out of Food in the Last Year: Never true  Transportation Needs: Not on file  Physical Activity: Not on file  Stress: Not on file  Social Connections: Not on file    Hospital Course:   During the patient's hospitalization, patient had extensive initial psychiatric evaluation, and follow-up psychiatric evaluations every day.   Psychiatric diagnoses provided upon initial assessment:  MDD, recurrent episode GAD Social Anxiety Disorder   Patient's psychiatric medications were adjusted on admission:  -Start Effexor XR 37.5 mg with plan to titrate up -Start propranolol 10 mg bid for social anxiety   During the hospitalization, other adjustments were made to the patient's psychiatric medication regimen:  -Increased venlafaxine XR to 150 mg for depressive symptoms and anxiety -Continue Propranolol 10 mg twice daily for social anxiety -Switch from trazodone to scheduled hydroxyzine 50 mg qhs for insomnia   Gradually, patient started adjusting to milieu.   Patient's care was discussed during the interdisciplinary team meeting every day during the hospitalization.   The patient denies having side effects to prescribed psychiatric medication.   The patient reports their target psychiatric symptoms of depression and anxiety responded well to the psychiatric medications, and the patient reports overall benefit other psychiatric hospitalization. Supportive psychotherapy was provided to the patient. The patient also participated in regular group therapy while admitted.    Labs were reviewed with the patient, and abnormal results were discussed with the patient.   The patient denied having suicidal thoughts more than 48 hours prior to discharge.  Patient denies having homicidal thoughts.   Patient denies having auditory hallucinations.  Patient denies any visual hallucinations.  Patient denies having paranoid thoughts.   The patient is able to verbalize their individual safety plan to this provider.   It is recommended to the patient to continue psychiatric medications as prescribed, after discharge from the hospital.     It is recommended to the patient to follow up with your outpatient psychiatric provider and PCP.   Discussed with the patient, the impact of alcohol, drugs, tobacco have been there overall psychiatric and medical wellbeing, and total abstinence from substance use was recommended the patient.  Physical Findings: AIMS:    Musculoskeletal: Strength & Muscle Tone: within normal limits Gait & Station: normal Patient leans: N/A   Psychiatric Specialty Exam:  Presentation  General Appearance: Appropriate for Environment; Casual   Eye Contact:Good   Speech:Clear and Coherent; Normal Rate   Speech Volume:Normal   Handedness:Right    Mood and Affect  Mood:Euthymic   Affect:Appropriate; Congruent    Thought Process  Thought Processes:Goal Directed; Linear; Coherent   Descriptions of Associations:Intact   Orientation:Full (Time, Place and Person)   Thought Content:Logical   History of Schizophrenia/Schizoaffective disorder:No data recorded  Duration of Psychotic Symptoms:No data recorded  Hallucinations:Hallucinations: None   Ideas of Reference:None   Suicidal Thoughts:Suicidal Thoughts: No   Homicidal Thoughts:Homicidal Thoughts: No    Sensorium  Memory:Immediate Good; Recent Good; Remote Good   Judgment:Fair   Insight:Fair    Executive Functions  Concentration:Good   Attention Span:Good   Recall:Good   Fund of Knowledge:Good   Language:Good    Psychomotor Activity  Psychomotor Activity:Psychomotor Activity: Normal   Assets  Assets:Communication Skills; Desire for Improvement; Financial  Resources/Insurance; Housing; Intimacy; Leisure Time; Physical Health    Sleep  Sleep:Sleep: Fair    Physical Exam: Physical Exam Review of Systems  Respiratory:  Negative for shortness of breath.   Cardiovascular:  Negative for chest pain.  Gastrointestinal:  Negative for abdominal pain, constipation, diarrhea, heartburn, nausea and vomiting.  Neurological:  Negative for headaches.   Blood pressure 123/87, pulse (!) 106, temperature 97.8 F (36.6 C), temperature source Oral, resp. rate 16, height 5\' 10"  (1.778 m), weight 112.5 kg, SpO2 97 %. Body mass index is 35.58 kg/m.   Social History   Tobacco Use  Smoking Status Every Day   Packs/day: 0.50   Years: 24.00   Total pack years: 12.00   Types: Cigarettes  Smokeless Tobacco Never  Tobacco Comments   11/15/2014 "down from 1 1/2 ppd in 08/2014"   Tobacco Cessation:  A prescription for an FDA-approved tobacco cessation medication was offered at discharge and the patient refused   Blood Alcohol level:  Lab Results  Component Value Date   ETH <5 04/08/2015    Metabolic Disorder Labs:  Lab Results  Component Value Date   HGBA1C 5.4 01/06/2022   MPG 108.28 01/06/2022   No results found for: "PROLACTIN" Lab Results  Component Value Date   CHOL 187 01/06/2022   TRIG 185 (H) 01/06/2022   HDL 33 (L) 01/06/2022   CHOLHDL 5.7 01/06/2022   VLDL 37 01/06/2022   LDLCALC 117 (H) 01/06/2022    See Psychiatric Specialty Exam and Suicide Risk Assessment completed by Attending Physician prior to discharge.  Discharge destination:  Home  Is patient on multiple antipsychotic therapies at discharge:  No   Has Patient had three or more failed trials of antipsychotic monotherapy by history:  No  Recommended Plan for Multiple Antipsychotic Therapies: NA  Discharge Instructions     Diet - low sodium heart healthy   Complete by: As directed    Discharge instructions   Complete by: As directed    Take all medications as  prescribed by his/her mental healthcare provider. Report any adverse effects and or reactions from the medicines to your outpatient provider promptly. Do not engage in alcohol and or illegal drug use while on prescription medicines. In the event of worsening symptoms, call the crisis hotline, 911 and or go to the nearest ED for appropriate evaluation and treatment of symptoms. follow-up with your primary care provider for your other medical issues, concerns and or health care needs.   Increase activity slowly   Complete by: As directed       Allergies as of 01/12/2022       Reactions   Flexeril [cyclobenzaprine] Other (See Comments)   "seizures"   Paxil [paroxetine] Other (See Comments)   "Makes me sweat really bad"        Medication List     STOP taking these medications    buPROPion 150 MG 12 hr tablet Commonly known as: WELLBUTRIN SR   clindamycin 1 % external solution  Commonly known as: CLEOCIN T   traZODone 50 MG tablet Commonly known as: DESYREL       TAKE these medications      Indication  colestipol 1 g tablet Commonly known as: COLESTID Take 1 g by mouth daily after supper.  Indication: High Amount of Cholesterol in the Blood   famotidine 40 MG tablet Commonly known as: PEPCID Take 40 mg by mouth daily as needed for heartburn or indigestion.  Indication: Heartburn   hydrOXYzine 50 MG tablet Commonly known as: ATARAX Take 1 tablet (50 mg total) by mouth at bedtime.  Indication: Insomnia   propranolol 10 MG tablet Commonly known as: INDERAL Take 1 tablet (10 mg total) by mouth 2 (two) times daily.  Indication: Anxiety Related to Current Life Problems   venlafaxine XR 150 MG 24 hr capsule Commonly known as: EFFEXOR-XR Take 1 capsule (150 mg total) by mouth daily with breakfast.  Indication: Major Depressive Disorder        Follow-up [place]. Go on 01/15/2022.   Specialty: Behavioral  Health Why: You have an appointment on 01/15/22 at 4:30 with Dr. De Nurse for medication management services (In person).  You also have appointments for therapy services on  02/04/22 at 11:00 am (THIS ONE IS VIRTUAL), and on 02/11/22 at 9:00 am (In Person). Contact information: [place] PARTIAL HOSPITALIZATION PROGRAM Follow up on 01/15/2022.   Specialty: Behavioral Health Why: You are scheduled for an assessment for the PHP on Thursday, 9/14 at 10:00 am. This appointment will last approximately one hour and will be virtual via Webex. PHP is virtual group therapy that runs Mon-Fri from 9am-1pm. Please download the [employer] app prior to the appointment. If you need to cancel or reschedule, please call [phone] Contact information: [place] [phone]                Follow-up recommendations:   Activity:  as tolerated Diet:  heart healthy   Comments:  Prescriptions were given at discharge.  Patient is agreeable with the discharge plan.  Patient was given an opportunity to ask questions.  Patient appears to feel comfortable with discharge and denies any current suicidal or homicidal thoughts.    Patient is instructed prior to discharge to: Take all medications as prescribed by mental healthcare provider. Report any adverse effects and or reactions from the medicines to outpatient provider promptly. In the event of worsening symptoms, patient is instructed to call the crisis hotline, 911 and or go to the nearest ED for appropriate evaluation and treatment of symptoms. Patient is to follow-up with primary care provider for other medical issues, concerns and or health care needs.   Signed: France Ravens, MD 01/12/2022, 9:14 AM

## 2022-01-12 NOTE — [facility] Group Notes (Signed)
  Spiritual care group on grief and loss facilitated by chaplain Dyanne Carrel, [facility]   Group Goal:   Support / Education around grief and loss   Members engage in facilitated group support and psycho-social education.   Group Description:   Following introductions and group rules, group members engaged in facilitated group dialog and support around topic of loss, with particular support around experiences of loss in their lives. Group Identified types of loss (relationships / self / things) and identified patterns, circumstances, and changes that precipitate losses. Reflected on thoughts / feelings around loss, normalized grief responses, and recognized variety in grief experience. Group noted Worden's four tasks of grief in discussion.   Group drew on Adlerian / Rogerian, narrative, MI,   Patient Progress: Brett Canales attended group and actively engaged in the group conversation.  He shared about some of his own grief responses and was supportive of group members.  [address] [facility] Pager, [phone]

## 2022-01-12 NOTE — Progress Notes (Signed)
Pt discharged to lobby. Pt was stable and appreciative at that time. All papers, suicide safety plan and prescriptions were given and valuables returned. Verbal understanding expressed. Denies SI/HI and A/VH. Pt given opportunity to express concerns and ask questions.  

## 2022-01-12 NOTE — Progress Notes (Signed)
  [facility] Adult Case Management Discharge Plan :  Will you be returning to the same living situation after discharge:  Yes,  Home  At discharge, do you have transportation home?: Yes,  Own car in parking lot  Do you have the ability to pay for your medications: Yes,  [employer]   Release of information consent forms completed and in the chart;  Patient's signature needed at discharge.  Patient to Follow up at:  Follow-up Information     [facility] [place]. Go on 01/15/2022.   Specialty: Behavioral Health Why: You have an appointment on 01/15/22 at 4:30 with Dr. Gilmore Laroche for medication management services (In person).  You also have appointments for therapy services on  02/04/22 at 11:00 am (THIS ONE IS VIRTUAL), and on 02/11/22 at 9:00 am (In Person). Contact information: [address] [place] [address] [place] [address] [phone]        [facility] PARTIAL HOSPITALIZATION PROGRAM Follow up on 01/15/2022.   Specialty: Behavioral Health Why: You are scheduled for an assessment for the PHP on Thursday, 9/14 at 10:00 am. This appointment will last approximately one hour and will be virtual via Webex. PHP is virtual group therapy that runs Mon-Fri from 9am-1pm. Please download the [employer] app prior to the appointment. If you need to cancel or reschedule, please call [phone] Contact information: [address] [place] [address] [phone]                Next level of care provider has access to [facility] Link:yes  Safety Planning and Suicide Prevention discussed: Yes,  with patient      Has patient been referred to the Quitline?: Patient refused referral  Patient has been referred for addiction treatment: N/A  Aram Beecham, LCSWA 01/12/2022, 9:14 AM

## 2022-01-12 NOTE — [facility] Suicide Risk Assessment (Addendum)
[facility] Discharge Suicide Risk Assessment   Principal Problem: MDD (major depressive disorder), recurrent severe, without psychosis (HCC) Discharge Diagnoses: Principal Problem:   MDD (major depressive disorder), recurrent severe, without psychosis (HCC)   Reason for Admission: Eduardo Barnes is a 46 y.o. male with hx of MDD, GAD with panic attacks, and degenerative disk disease previously on disability who presented to [facility] voluntarily with passive SI and worsening depression.  Recent stressors include learning about his wife's infidelity.  Hospital Summary During the patient's hospitalization, patient had extensive initial psychiatric evaluation, and follow-up psychiatric evaluations every day.  Psychiatric diagnoses provided upon initial assessment:  MDD, recurrent episode GAD Social Anxiety Disorder  Patient's psychiatric medications were adjusted on admission:  -Start Effexor XR 37.5 mg with plan to titrate up -Start propranolol 10 mg bid for social anxiety  During the hospitalization, other adjustments were made to the patient's psychiatric medication regimen:  -Increased venlafaxine XR to 150 mg for depressive symptoms and anxiety -Continue Propranolol 10 mg twice daily for social anxiety -Switch from trazodone to scheduled hydroxyzine 50 mg qhs for insomnia  Gradually, patient started adjusting to milieu.   Patient's care was discussed during the interdisciplinary team meeting every day during the hospitalization.  The patient denies having side effects to prescribed psychiatric medication.  The patient reports their target psychiatric symptoms of depression and anxiety responded well to the psychiatric medications, and the patient reports overall benefit other psychiatric hospitalization. Supportive psychotherapy was provided to the patient. The patient also participated in regular group therapy while admitted.   Labs were reviewed with the patient, and abnormal results were  discussed with the patient.  The patient denied having suicidal thoughts more than 48 hours prior to discharge.  Patient denies having homicidal thoughts.  Patient denies having auditory hallucinations.  Patient denies any visual hallucinations.  Patient denies having paranoid thoughts.  The patient is able to verbalize their individual safety plan to this provider.  It is recommended to the patient to continue psychiatric medications as prescribed, after discharge from the hospital.    It is recommended to the patient to follow up with your outpatient psychiatric provider and PCP.  Discussed with the patient, the impact of alcohol, drugs, tobacco have been there overall psychiatric and medical wellbeing, and total abstinence from substance use was recommended the patient.   Total Time spent with patient: 45 minutes  Musculoskeletal: Strength & Muscle Tone: within normal limits Gait & Station: normal Patient leans: N/A  Psychiatric Specialty Exam  Presentation  General Appearance: Appropriate for Environment; Casual   Eye Contact:Good   Speech:Clear and Coherent; Normal Rate   Speech Volume:Normal   Handedness:Right    Mood and Affect  Mood:Euthymic   Duration of Depression Symptoms: Greater than two weeks   Affect:Appropriate; Congruent    Thought Process  Thought Processes:Goal Directed; Linear; Coherent   Descriptions of Associations:Intact   Orientation:Full (Time, Place and Person)   Thought Content:Logical   History of Schizophrenia/Schizoaffective disorder:No data recorded  Duration of Psychotic Symptoms:No data recorded  Hallucinations:Hallucinations: None  Ideas of Reference:None   Suicidal Thoughts:Suicidal Thoughts: No  Homicidal Thoughts:Homicidal Thoughts: No   Sensorium  Memory:Immediate Good; Recent Good; Remote Good   Judgment:Fair   Insight:Fair    Executive Functions  Concentration:Good   Attention  Span:Good   Recall:Good   Fund of Knowledge:Good   Language:Good    Psychomotor Activity  Psychomotor Activity:Psychomotor Activity: Normal   Assets  Assets:Communication Skills; Desire for Improvement; Financial Resources/Insurance; Housing;  Intimacy; Leisure Time; Physical Health    Sleep  Sleep:Sleep: Fair   Physical Exam: Physical Exam Review of Systems  Respiratory:  Negative for shortness of breath.   Cardiovascular:  Negative for chest pain.  Gastrointestinal:  Negative for abdominal pain, constipation, diarrhea, heartburn, nausea and vomiting.  Neurological:  Negative for headaches.   Blood pressure 123/87, pulse (!) 106, temperature 97.8 F (36.6 C), temperature source Oral, resp. rate 16, height 5\' 10"  (1.778 m), weight 112.5 kg, SpO2 97 %. Body mass index is 35.58 kg/m.  Mental Status Per Nursing Assessment::   On Admission:  NA  Demographic Factors:  Male, Divorced or widowed, Caucasian, Low socioeconomic status, and Unemployed  Loss Factors: Decrease in vocational status, Loss of significant relationship, Decline in physical health, and Financial problems/change in socioeconomic status  Historical Factors: Impulsivity  Risk Reduction Factors:   Positive therapeutic relationship and Positive coping skills or problem solving skills  Continued Clinical Symptoms:  Severe Anxiety and/or Agitation Dysthymia More than one psychiatric diagnosis  Cognitive Features That Contribute To Risk:  None    Suicide Risk:  Mild:  Suicidal ideation of limited frequency, intensity, duration, and specificity.  There are no identifiable plans, no associated intent, mild dysphoria and related symptoms, good self-control (both objective and subjective assessment), few other risk factors, and identifiable protective factors, including available and accessible social support.   Follow-up Information     [facility] Go on  01/15/2022.   Specialty: Behavioral Health Why: You have an appointment on 01/15/22 at 4:30 with Dr. 01/17/22 for medication management services (In person).  You also have appointments for therapy services on  02/04/22 at 11:00 am (THIS ONE IS VIRTUAL), and on 02/11/22 at 9:00 am (In Person). Contact information: [address] [place] [phone]        [facility]OSP[facility] PROGRAM Follow up on 01/15/2022.   Specialty: Behavioral Health Why: You are scheduled for an assessment for the PHP on Thursday, 9/14 at 10:00 am. This appointment will last approximately one hour and will be virtual via Webex. PHP is virtual group therapy that runs Mon-Fri from 9am-1pm. Please download the 09-14-1969 app prior to the appointment. If you need to cancel or reschedule, please call [phone] Contact information: [address] [place] [phone]                Plan Of Care/Follow-up recommendations:  Activity: as tolerated  Diet: heart healthy  Other: -Follow-up with your outpatient psychiatric provider -instructions on appointment date, time, and address (location) are provided to you in discharge paperwork.  -Take your psychiatric medications as prescribed at discharge - instructions are provided to you in the discharge paperwork  -Follow-up with outpatient primary care doctor and other specialists -for management of chronic medical disease, including: GERD, Dyslipidemia, nicotine use disorder  -Testing: Follow-up with outpatient provider for abnormal lab results: repeat CBC (unspecified leucocytosis)  -Recommend abstinence from alcohol, tobacco, and other illicit drug use at discharge.   -If your psychiatric symptoms recur, worsen, or if you have side effects to your psychiatric medications, call your outpatient psychiatric provider, 911, [phone] or go to the nearest emergency department.  -If  suicidal thoughts recur, call your outpatient psychiatric provider, 911, [phone] or go to the nearest emergency department.   [phone], MD 01/12/2022, 9:14 AM

## 2022-01-12 NOTE — Group Note (Signed)
Recreation Therapy Group Note   Group Topic:Team Building  Group Date: 01/12/2022 Start Time: 0935 End Time: 0950 Facilitators: Caroll Rancher, LRT,CTRS Location: [address] Day[address]   Goal Area(s) Addresses:  Patient will effectively work with peer towards shared goal.  Patient will identify skills used to make activity successful.  Patient will identify how skills used during activity can be applied to reach post d/c goals.    Group Description: [employer]. In teams of 5-6, patients were given 11 craft pipe cleaners. Using the materials provided, patients were instructed to compete again the opposing team(s) to build the tallest free-standing structure from floor level. The activity was timed; difficulty increased by Clinical research associate as Production designer, theatre/television/film continued.  Systematically resources were removed with additional directions for example, placing one arm behind their back, working in silence, and shape stipulations. LRT facilitated post-activity discussion reviewing team processes and necessary communication skills involved in completion. Patients were encouraged to reflect how the skills utilized, or not utilized, in this activity can be incorporated to positively impact support systems post discharge.   Affect/Mood: Appropriate   Participation Level: Engaged   Participation Quality: Independent   Behavior: Appropriate   Speech/Thought Process: Focused   Insight: Good   Judgement: Good   Modes of Intervention: STEM Activity   Patient Response to Interventions:  Engaged   Education Outcome:  Acknowledges education and In group clarification offered    Clinical Observations/Individualized Feedback: Pt attended and participated in group activity.    Plan: Continue to engage patient in RT group sessions 2-3x/week.   Caroll Rancher, LRT,CTRS  01/12/2022 11:19 AM

## 2022-01-15 ENCOUNTER — Ambulatory Visit (HOSPITAL_COMMUNITY): Payer: 59 | Admitting: Psychiatry

## 2022-01-15 ENCOUNTER — Other Ambulatory Visit (HOSPITAL_COMMUNITY): Payer: 59 | Attending: Psychiatry

## 2022-01-15 ENCOUNTER — Telehealth (HOSPITAL_COMMUNITY): Payer: Self-pay | Admitting: Professional

## 2022-01-28 ENCOUNTER — Encounter (HOSPITAL_COMMUNITY): Payer: Self-pay

## 2022-01-28 ENCOUNTER — Telehealth (HOSPITAL_COMMUNITY): Payer: 59 | Admitting: Psychiatry

## 2022-02-04 ENCOUNTER — Ambulatory Visit (INDEPENDENT_AMBULATORY_CARE_PROVIDER_SITE_OTHER): Payer: 59 | Admitting: Licensed Clinical Social Worker

## 2022-02-04 DIAGNOSIS — Z639 Problem related to primary support group, unspecified: Secondary | ICD-10-CM | POA: Diagnosis not present

## 2022-02-04 DIAGNOSIS — F331 Major depressive disorder, recurrent, moderate: Secondary | ICD-10-CM | POA: Diagnosis not present

## 2022-02-04 DIAGNOSIS — F411 Generalized anxiety disorder: Secondary | ICD-10-CM | POA: Diagnosis not present

## 2022-02-04 DIAGNOSIS — F410 Panic disorder [episodic paroxysmal anxiety] without agoraphobia: Secondary | ICD-10-CM

## 2022-02-04 NOTE — Progress Notes (Signed)
Virtual Visit via Video Note  I connected with Eduardo Barnes on 02/04/22 at 11:00 AM EDT by a video enabled telemedicine application and verified that I am speaking with the correct person using two identifiers.  Location: Patient: home Provider: home office   I discussed the limitations of evaluation and management by telemedicine and the availability of in person appointments. The patient expressed understanding and agreed to proceed.   I discussed the assessment and treatment plan with the patient. The patient was provided an opportunity to ask questions and all were answered. The patient agreed with the plan and demonstrated an understanding of the instructions.   The patient was advised to call back or seek an in-person evaluation if the symptoms worsen or if the condition fails to improve as anticipated.  I provided 20 minutes of non-face-to-face time during this encounter.  THERAPIST PROGRESS NOTE  Session Time: 11:00 AM to 11:20 AM  Participation Level: Active  Behavioral Response: CasualAlertEuthymic  Type of Therapy: Individual Therapy  Treatment Goals addressed: Unable to complete paperwork but from history gathered work on anxiety, panic, depression, and stressors  ProgressTowards Goals: Initial  Interventions: Solution Focused, Strength-based, Supportive, and Other: coping  Summary: Eduardo Barnes is a 46 y.o. male who presents with new job actually likes his job. Went to the hospital "couldn't hack it and thought needed skills to help cope."  Therapist assesses very positive that patient went to the hospital with increase of symptoms strength to seek support if needed.  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 Helps a lot helps with self-esteem help more with the job than anything. Has work restrictions did a functional exam.  Ran into technical problems as well as patient needing to leave earlier to get to  work.  Therapist very enthusiastic about positive news for patient.  Therapist provided active listening open questions supportive interventions.  Plan to complete rest of assessment and treatment plan at next session.  Suicidal/Homicidal: No  Plan: Return again in 1 week.2.  Pleated paperwork start to work on treatment goals.2.  Need to review diagnosis of social anxiety with patient  Diagnosis: Major depressive disorder, recurrent, moderate, neurolyse anxiety disorder, panic disorder relationship dysfunction  Collaboration of Care: Other review of discharge summary by Dr. Mitzi Hansen 911 from hospitalization 9 6 through 911  Patient/Guardian was advised Release of Information must be obtained prior to any record release in order to collaborate their care with an outside provider. Patient/Guardian was advised if they have not already done so to contact the registration department to sign all necessary forms in order for [place] to release information regarding their care.   Consent: Patient/Guardian gives verbal consent for treatment and assignment of benefits for services provided during this visit. Patient/Guardian expressed understanding and agreed to proceed.   Cordella Register, LCSW 02/04/2022

## 2022-02-11 ENCOUNTER — Ambulatory Visit (INDEPENDENT_AMBULATORY_CARE_PROVIDER_SITE_OTHER): Payer: 59 | Admitting: Licensed Clinical Social Worker

## 2022-02-11 ENCOUNTER — Telehealth (HOSPITAL_COMMUNITY): Payer: Self-pay | Admitting: Psychiatry

## 2022-02-11 DIAGNOSIS — Z639 Problem related to primary support group, unspecified: Secondary | ICD-10-CM

## 2022-02-11 DIAGNOSIS — F410 Panic disorder [episodic paroxysmal anxiety] without agoraphobia: Secondary | ICD-10-CM

## 2022-02-11 DIAGNOSIS — F331 Major depressive disorder, recurrent, moderate: Secondary | ICD-10-CM | POA: Diagnosis not present

## 2022-02-11 DIAGNOSIS — F411 Generalized anxiety disorder: Secondary | ICD-10-CM | POA: Diagnosis not present

## 2022-02-11 NOTE — Progress Notes (Signed)
Virtual Visit via Video Note  I connected with Eduardo Barnes on 02/11/22 at  9:00 AM EDT by a video enabled telemedicine application and verified that I am speaking with the correct person using two identifiers.  Location: Patient: home Provider: home office   I discussed the limitations of evaluation and management by telemedicine and the availability of in person appointments. The patient expressed understanding and agreed to proceed.   I discussed the assessment and treatment plan with the patient. The patient was provided an opportunity to ask questions and all were answered. The patient agreed with the plan and demonstrated an understanding of the instructions.   The patient was advised to call back or seek an in-person evaluation if the symptoms worsen or if the condition fails to improve as anticipated.  I provided 50 minutes of non-face-to-face time during this encounter.  THERAPIST PROGRESS NOTE  Session Time: 9:00 AM to 9:50 AM  Participation Level: Active  Behavioral Response: CasualAlertEuthymic  Type of Therapy: Individual Therapy  Treatment Goals addressed: Completed assessment still need to work on treatment plan but focusing on anxiety depression, coping  ProgressTowards Goals: Progressing-treatment plan not completed but patient making progress on goals of depression going back to work as boosted his self-confidence, turning negative thoughts to positive  Interventions: Solution Focused, Play Therapy, Supportive, and Other: Continue to gather information to complete assessment  Summary: Eduardo Barnes is a 46 y.o. male who presents with worked on completing assessment was able to complete still need to complete questionnaires and treatment plan.  Well updating assessment noted work has really helped his mental health.  Has helped him feel better about himself, help him be less isolative.  He likes it so much thinking about full-time back is actually getting  stronger.  Has made friends at work.  Patient also said inpatient help with coping he is able to stop when his thoughts go negative and to positive self talk.  Still has trouble sleeping but not as often mind racing at night about relationship still working on relationship things seem to be going positive.  Therapist very enthusiastic about positive direction of things, work playing a big part that are leading things in a positive direction for patient in terms of health, finances self-esteem.  Helping his wellbeing in all dimensions.  Suicidal/Homicidal: No  Plan: Return again in 2 weeks.2.  Pleat questionnaires, treatment plan mom processed feelings thoughts and stressors to help with depression and anxiety  Diagnosis: Major depressive disorder, moderate, recurrent, generalized anxiety disorder, panic disorder, relationship dysfunction  Collaboration of Care: Other none needed  Patient/Guardian was advised Release of Information must be obtained prior to any record release in order to collaborate their care with an outside provider. Patient/Guardian was advised if they have not already done so to contact the registration department to sign all necessary forms in order for [place] to release information regarding their care.   Consent: Patient/Guardian gives verbal consent for treatment and assignment of benefits for services provided during this visit. Patient/Guardian expressed understanding and agreed to proceed.   Cordella Register, LCSW 02/11/2022

## 2022-02-11 NOTE — Telephone Encounter (Signed)
Patient's appointment with therapist today initially scheduled as in person but is virtual. Left VM for patient and sent MyChart message regarding this.

## 2022-02-26 ENCOUNTER — Encounter (HOSPITAL_COMMUNITY): Payer: Self-pay

## 2022-02-26 ENCOUNTER — Ambulatory Visit (INDEPENDENT_AMBULATORY_CARE_PROVIDER_SITE_OTHER): Payer: 59 | Admitting: Licensed Clinical Social Worker

## 2022-02-26 DIAGNOSIS — Z639 Problem related to primary support group, unspecified: Secondary | ICD-10-CM

## 2022-02-26 DIAGNOSIS — F331 Major depressive disorder, recurrent, moderate: Secondary | ICD-10-CM | POA: Diagnosis not present

## 2022-02-26 DIAGNOSIS — F410 Panic disorder [episodic paroxysmal anxiety] without agoraphobia: Secondary | ICD-10-CM

## 2022-02-26 DIAGNOSIS — F411 Generalized anxiety disorder: Secondary | ICD-10-CM

## 2022-02-26 NOTE — Plan of Care (Signed)
  Problem: Depression CCP Problem  1  Goal:  learn effective coping skills for mood management Outcome: Not Progressing Goal: LTG: Reduce frequency, intensity, and duration of depression symptoms as evidenced by: SSB input needed on appropriate metric Outcome: Not Progressing Goal: 40LTG: Increase daily functioning by 40% as evidenced by SSB input needed on appropriate metric Outcome: Not Progressing Goal: STG: Remo Lipps WILL IDENTIFY as needed COGNITIVE PATTERNS AND BELIEFS THAT SUPPORT DEPRESSION Outcome: Not Progressing Goal: STG: [place] as needed TIMES PER WEEK FOR THE NEXT 6 months Outcome: Not Progressing

## 2022-02-26 NOTE — Plan of Care (Signed)
Patient participated in completion of treatment plan 

## 2022-02-26 NOTE — Progress Notes (Signed)
Virtual Visit via Telephone Note  I connected with Eduardo Barnes on 02/26/22 at  8:00 AM EDT by telephone and verified that I am speaking with the correct person using two identifiers.  Location: Patient: home Provider: home office   I discussed the limitations, risks, security and privacy concerns of performing an evaluation and management service by telephone and the availability of in person appointments. I also discussed with the patient that there may be a patient responsible charge related to this service. The patient expressed understanding and agreed to proceed.   I discussed the assessment and treatment plan with the patient. The patient was provided an opportunity to ask questions and all were answered. The patient agreed with the plan and demonstrated an understanding of the instructions.   The patient was advised to call back or seek an in-person evaluation if the symptoms worsen or if the condition fails to improve as anticipated.  I provided 50 minutes of non-face-to-face time during this encounter.  THERAPIST PROGRESS NOTE  Session Time: 8:00 AM to 8:50 AM  Participation Level: Active  Behavioral Response: CasualAlertEuthymic  Type of Therapy: Individual Therapy  Treatment Goals addressed: Depression  ProgressTowards Goals: Progressing-completed formal treatment plan but note depression improved  Interventions: Solution Focused, Strength-based, Supportive, and Other: coping  Summary: Eduardo Barnes is a 46 y.o. male who presents with working full time now. It has been rough physically but getting used to it. Monday rough last night not as bad body getting used to it. Prescription not refilled fine without and even better probably. Texted ex-wife saw stepdaughter car wife's lover's road. Assumed it was her. Was mad texted her hope have a good night. She responded what is up with the attitude? Said need to get prescription for stomach medication. Said had one to pick  up so said would meet him. Finally asked her is she driving her daughter's car said no. Thought it was her at his house. Asked if stocking her and said no live close by. Not seeing him but friends. Patient said if we are going to work this out how expect to feel about. How supposed to feel if still are friends. Asked her for hug he asks because she has cut herself emotionally and physically. Asked her to go to move [place] she said yes after interaction said maybe you don't want to work on this. Patient thinks maybe not given her enough time to miss him. Come to terms he thinks she is covert narcissist. Coming to believe may get together use to keep from going after her from the divorce settlement feeling she doesn't want to get together saying to appease him. Use emotions against her. Completely cold when it comes to that not hug not talk about it. Blamed on patient no apology for cheating cheated but you drove her to him. Doesn't acknowledge he was depressed. Better to hold against him where was he when she was "working herself to death" patient said depressed didn't know if could work again. She can't see anything from his perspective didn't acknowledge his depression. Patient has done things to ease the burden son's friends there for years and didn't do anything. Ask for kids help and didn't get it and didn't step in to get them to help. She says he is controlling and three friends didn't want her to see. Herbie Baltimore was one who she showed interest in, friend Nira Conn introduced to Herbie Baltimore knowing that they were married. Other one was [name]. Didn't  care if have friends wanted to she wanted to keep ones that had feelings for. Realization brought up from under the cloud. Tell her what felt took burden off shoulders felt like that for a long time. Help to ease burden if blame him go ahead accept that his intentions right and he would not done what she did to him. Use his feelings for at least 6 years.  Turned the work against him. Said in a position where had no money. No money to survive forced in a situation where had to work and not sure if could do the job. What about her feelings and disregard him in all of that. Talk to her feel cold and completely shut off to him feels like don't even want to work on anything. There are things she could have done have the kids help out with house. Contribute other ways by taking care of the house. No credit for anything. Made it to get to point where feel worthless. Step back see manipulating and patient was in crippled state forever. She was always unwilling to work with him. Always his fault. Talked about something and then shut down. Wonders if relationship with Leroy Sea going on all the time.      Therapist completed paperwork including treatment plan patient gave consent to complete virtually therapist provided space and support for patient to talk about thoughts and feelings in session.  Therapist feedback was to see through what people said and did where their intentions were and that his wife we separated from was not showing signs of putting all efforts in to work it out.  Patient identifying signs therapist agreeing were concerning.  Noted is well in their interactions what patient assesses not any consideration for his feelings some of her behaviors may not of happen if she had more consideration for how it would impacted him.  Exploring with patient his thoughts and feelings so we can make healthy decisions.  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  Completed PHQ-9 noted minimal depression.  Assessed patient still needs an outlet to work through his feelings as he says helpful to get some of the things he has been feeling for a long time off his chest. Suicidal/Homicidal: No  Plan: Return again in 3 weeks.2.  Work on relationship issues, get thoughts and feelings workbook  Diagnosis: Major depressive disorder,  moderate, recurrent, generalized anxiety disorder, panic disorder, relationship dysfunction  Collaboration of Care: Other none needed  Patient/Guardian was advised Release of Information must be obtained prior to any record release in order to collaborate their care with an outside provider. Patient/Guardian was advised if they have not already done so to contact the registration department to sign all necessary forms in order for [place] to release information regarding their care.   Consent: Patient/Guardian gives verbal consent for treatment and assignment of benefits for services provided during this visit. Patient/Guardian expressed understanding and agreed to proceed.   Cordella Register, LCSW 02/26/2022

## 2022-03-17 ENCOUNTER — Ambulatory Visit (INDEPENDENT_AMBULATORY_CARE_PROVIDER_SITE_OTHER): Payer: 59 | Admitting: Licensed Clinical Social Worker

## 2022-03-17 DIAGNOSIS — F331 Major depressive disorder, recurrent, moderate: Secondary | ICD-10-CM

## 2022-03-17 DIAGNOSIS — Z639 Problem related to primary support group, unspecified: Secondary | ICD-10-CM

## 2022-03-17 DIAGNOSIS — F411 Generalized anxiety disorder: Secondary | ICD-10-CM

## 2022-03-17 DIAGNOSIS — F410 Panic disorder [episodic paroxysmal anxiety] without agoraphobia: Secondary | ICD-10-CM

## 2022-03-17 NOTE — Progress Notes (Signed)
Patient scheduled for session and he did not show

## 2022-12-15 ENCOUNTER — Other Ambulatory Visit: Payer: Self-pay | Admitting: Family Medicine

## 2022-12-15 DIAGNOSIS — S46911A Strain of unspecified muscle, fascia and tendon at shoulder and upper arm level, right arm, initial encounter: Secondary | ICD-10-CM

## 2022-12-25 ENCOUNTER — Ambulatory Visit
Admission: RE | Admit: 2022-12-25 | Discharge: 2022-12-25 | Disposition: A | Discharge status: Home / Self Care | Payer: Worker's Compensation | Source: Ambulatory Visit | Attending: Family Medicine | Admitting: Family Medicine

## 2022-12-25 DIAGNOSIS — S46911A Strain of unspecified muscle, fascia and tendon at shoulder and upper arm level, right arm, initial encounter: Secondary | ICD-10-CM
